# Patient Record
Sex: Female | Born: 1965
Health system: Southern US, Community
[De-identification: ages and names within clinical notes are randomized; demographics above are authoritative.]

## PROBLEM LIST (undated history)

## (undated) DIAGNOSIS — D696 Thrombocytopenia, unspecified: Secondary | ICD-10-CM

## (undated) DIAGNOSIS — K219 Gastro-esophageal reflux disease without esophagitis: Secondary | ICD-10-CM

## (undated) DIAGNOSIS — M25569 Pain in unspecified knee: Secondary | ICD-10-CM

## (undated) DIAGNOSIS — E785 Hyperlipidemia, unspecified: Secondary | ICD-10-CM

## (undated) DIAGNOSIS — I10 Essential (primary) hypertension: Secondary | ICD-10-CM

## (undated) DIAGNOSIS — F32A Depression, unspecified: Secondary | ICD-10-CM

## (undated) DIAGNOSIS — F419 Anxiety disorder, unspecified: Secondary | ICD-10-CM

## (undated) DIAGNOSIS — F329 Major depressive disorder, single episode, unspecified: Secondary | ICD-10-CM

## (undated) HISTORY — PX: TUBAL LIGATION: SHX77

## (undated) HISTORY — DX: Depression, unspecified: F32.A

## (undated) HISTORY — DX: Thrombocytopenia, unspecified: D69.6

## (undated) HISTORY — DX: Anxiety disorder, unspecified: F41.9

## (undated) HISTORY — DX: Pain in unspecified knee: M25.569

## (undated) HISTORY — DX: Gastro-esophageal reflux disease without esophagitis: K21.9

## (undated) HISTORY — DX: Major depressive disorder, single episode, unspecified: F32.9

## (undated) HISTORY — DX: Hyperlipidemia, unspecified: E78.5

## (undated) HISTORY — DX: Essential (primary) hypertension: I10

---

## 2014-03-30 ENCOUNTER — Encounter: Payer: Self-pay | Admitting: Vascular Surgery

## 2014-03-30 ENCOUNTER — Other Ambulatory Visit: Payer: Self-pay | Admitting: *Deleted

## 2014-03-30 DIAGNOSIS — M79606 Pain in leg, unspecified: Secondary | ICD-10-CM

## 2014-05-01 ENCOUNTER — Encounter: Payer: Self-pay | Admitting: Vascular Surgery

## 2014-05-02 ENCOUNTER — Encounter (HOSPITAL_COMMUNITY): Payer: Self-pay

## 2014-05-02 ENCOUNTER — Encounter: Payer: Self-pay | Admitting: Vascular Surgery

## 2014-05-29 ENCOUNTER — Encounter: Payer: Self-pay | Admitting: Vascular Surgery

## 2014-05-30 ENCOUNTER — Encounter (HOSPITAL_COMMUNITY): Payer: Self-pay

## 2014-05-30 ENCOUNTER — Encounter: Payer: Self-pay | Admitting: Vascular Surgery

## 2014-10-02 ENCOUNTER — Encounter: Payer: Self-pay | Admitting: Vascular Surgery

## 2014-10-03 ENCOUNTER — Encounter: Payer: Self-pay | Admitting: Vascular Surgery

## 2014-10-03 ENCOUNTER — Ambulatory Visit (HOSPITAL_COMMUNITY)
Admission: RE | Admit: 2014-10-03 | Discharge: 2014-10-03 | Disposition: A | Discharge status: Home / Self Care | Payer: BLUE CROSS/BLUE SHIELD | Source: Ambulatory Visit | Attending: Vascular Surgery | Admitting: Vascular Surgery

## 2014-10-03 ENCOUNTER — Ambulatory Visit (INDEPENDENT_AMBULATORY_CARE_PROVIDER_SITE_OTHER): Payer: BLUE CROSS/BLUE SHIELD | Admitting: Vascular Surgery

## 2014-10-03 VITALS — BP 109/78 | HR 103 | Resp 16 | Ht 61.0 in | Wt 217.0 lb

## 2014-10-03 DIAGNOSIS — R208 Other disturbances of skin sensation: Secondary | ICD-10-CM

## 2014-10-03 DIAGNOSIS — Z87891 Personal history of nicotine dependence: Secondary | ICD-10-CM | POA: Diagnosis not present

## 2014-10-03 DIAGNOSIS — I10 Essential (primary) hypertension: Secondary | ICD-10-CM | POA: Diagnosis not present

## 2014-10-03 DIAGNOSIS — R200 Anesthesia of skin: Secondary | ICD-10-CM

## 2014-10-03 DIAGNOSIS — E785 Hyperlipidemia, unspecified: Secondary | ICD-10-CM | POA: Diagnosis not present

## 2014-10-03 DIAGNOSIS — M79606 Pain in leg, unspecified: Secondary | ICD-10-CM

## 2014-10-03 NOTE — Progress Notes (Signed)
Patient name: Humberto LeepJudy Bergeron MRN: [id number] DOB: 11/23/1965 Sex: female   Referred by: Marvis MoellerMiles  Reason for referral:  Chief Complaint  Patient presents with  . New Evaluation    bilateral leg pain, numbness and swelling    HISTORY OF PRESENT ILLNESS: The patient is a 49 year old female with a six-month history of difficulty in her lower extremities. She reports that she has a pins and needles sensation in both feet. This will persist and then involves and 10 numbness from her knees distally into her feet. She reports this lasted several minutes and then resolved spontaneously. She is not reporting that this is associated with exercise. This just comes on spontaneously. She does report occasional swelling which is mild in her ankles and is not related to prolonged standing. She has no history of DVT. She reports that this has not been particularly progressive and has been relatively stable over the past 6 months. She does not report any calf cramping with walking. No typical arterial rest pain. No tissue loss. Does have a remote history of cigarette smoking and does have elevated platelets and is on aspirin for this reason  Past Medical History  Diagnosis Date  . Pain in joint, lower leg   . GERD (gastroesophageal reflux disease)   . Anxiety   . Depression   . Hypertension   . Hyperlipidemia   . Thrombocytopenia     Past Surgical History  Procedure Laterality Date  . Tubal ligation      History   Social History  . Marital Status: Unknown    Spouse Name: N/A    Number of Children: N/A  . Years of Education: N/A   Occupational History  . Not on file.   Social History Main Topics  . Smoking status: Former Smoker    Quit date: 10/03/1990  . Smokeless tobacco: Never Used  . Alcohol Use: No  . Drug Use: No  . Sexual Activity: Not on file   Other Topics Concern  . Not on file   Social History Narrative    Family History  Problem Relation Age of Onset  .  Hyperlipidemia Mother   . Hypertension Mother   . Hyperlipidemia Father   . Hypertension Father     Allergies as of 10/03/2014 - Review Complete 10/03/2014  Allergen Reaction Noted  . Imitrex [sumatriptan]  10/03/2014  . Sulfa antibiotics  03/30/2014    Current Outpatient Prescriptions on File Prior to Visit  Medication Sig Dispense Refill  . amitriptyline (ELAVIL) 75 MG tablet Take 75 mg by mouth at bedtime.    Marland Kitchen. atenolol (TENORMIN) 50 MG tablet Take 50 mg by mouth daily.    . busPIRone (BUSPAR) 10 MG tablet Take 10 mg by mouth 3 (three) times daily.    Marland Kitchen. lovastatin (MEVACOR) 40 MG tablet Take 40 mg by mouth at bedtime.    Marland Kitchen. omeprazole (PRILOSEC) 20 MG capsule Take 20 mg by mouth daily.    . promethazine (PHENERGAN) 25 MG tablet Take 25 mg by mouth every 6 (six) hours as needed for nausea or vomiting.    Marland Kitchen. amoxicillin (AMOXIL) 500 MG capsule Take 500 mg by mouth 3 (three) times daily.    . SUMAtriptan (IMITREX) 25 MG tablet Take 25 mg by mouth every 2 (two) hours as needed for migraine or headache. May repeat in 2 hours if headache persists or recurs.     No current facility-administered medications on file prior to visit.  REVIEW OF SYSTEMS:  Positives indicated with an "X"  CARDIOVASCULAR:   chest pain    chest pressure    palpitations   [x ] orthopnea   [x ] dyspnea on exertion    claudication    rest pain    DVT    phlebitis PULMONARY:    productive cough   [x ] asthma    wheezing NEUROLOGIC:    weakness  [x ] paresthesias   aphasia   amaurosis   dizziness HEMATOLOGIC:    bleeding problems    clotting disorders MUSCULOSKELETAL:   joint pain    joint swelling GASTROINTESTINAL:   blood in stool    hematemesis GENITOURINARY:    dysuria    hematuria PSYCHIATRIC:  [x ] history of major depression INTEGUMENTARY:   rashes   ulcers CONSTITUTIONAL:   fever    chills  PHYSICAL EXAMINATION:  General:  The patient is a well-nourished female, in no acute distress. Obesity Vital signs are BP 109/78 mmHg  Pulse 103  Resp 16  Ht  (1.549 m)  Wt 217 lb (98.431 kg)  BMI 41.02 kg/m2 Pulmonary: There is a good air exchange bilaterally without wheezing or rales. Abdomen: Soft and non-tender with normal pitch bowel sounds. Musculoskeletal: There are no major deformities.  There is no significant extremity pain. Neurologic: No focal weakness or paresthesias are detected, Skin: There are no ulcer or rashes noted. Psychiatric: The patient has normal affect. Cardiovascular: There is a regular rate and rhythm without significant murmur appreciated. Pulse status: 2+ radial 2+ femoral 2+ popliteal 2+ posterior tibial and 2+ dorsalis pedis pulses bilaterally No evidence of changes of venous hypertension. No varicosities or telangiectasias  [facility] Vascular Lab Studies:  Ordered and Independently Reviewed normal ankle arm index and normal triphasic waveforms in the anterior tibial and posterior tibial bilaterally  Impression and Plan:  A long discussion with the patient. I explained that she does not have any evidence of arterial or venous pathology that would explain her symptoms. Explained her symptoms appear to be more neuropathic than vascular. She was relieved this discussion. Would not recommend any further evaluation or follow-up. She will follow-up with her primary physician to determine if other treatment options are available for her. She will see [place] On an As-Needed basis    Jossalin Chervenak [facility] Office: [phone]

## 2014-10-04 DIAGNOSIS — Z0279 Encounter for issue of other medical certificate: Secondary | ICD-10-CM | POA: Diagnosis not present

## 2014-10-09 ENCOUNTER — Encounter: Payer: Self-pay | Admitting: Vascular Surgery

## 2015-06-20 DIAGNOSIS — J45909 Unspecified asthma, uncomplicated: Secondary | ICD-10-CM | POA: Insufficient documentation

## 2015-06-20 DIAGNOSIS — I2510 Atherosclerotic heart disease of native coronary artery without angina pectoris: Secondary | ICD-10-CM | POA: Insufficient documentation

## 2015-06-20 DIAGNOSIS — F419 Anxiety disorder, unspecified: Secondary | ICD-10-CM | POA: Insufficient documentation

## 2015-06-20 HISTORY — DX: Atherosclerotic heart disease of native coronary artery without angina pectoris: I25.10

## 2015-06-20 HISTORY — DX: Unspecified asthma, uncomplicated: J45.909

## 2015-08-17 DIAGNOSIS — Z0279 Encounter for issue of other medical certificate: Secondary | ICD-10-CM | POA: Diagnosis not present

## 2015-09-04 ENCOUNTER — Ambulatory Visit: Payer: Self-pay | Admitting: Cardiovascular Disease

## 2019-10-05 ENCOUNTER — Other Ambulatory Visit: Payer: Self-pay | Admitting: Physician Assistant

## 2019-10-21 ENCOUNTER — Other Ambulatory Visit: Payer: Self-pay | Admitting: Physician Assistant

## 2019-10-26 ENCOUNTER — Ambulatory Visit (INDEPENDENT_AMBULATORY_CARE_PROVIDER_SITE_OTHER): Payer: Commercial Managed Care - PPO | Admitting: Physician Assistant

## 2019-10-26 ENCOUNTER — Encounter: Payer: Self-pay | Admitting: Physician Assistant

## 2019-10-26 ENCOUNTER — Other Ambulatory Visit: Payer: Self-pay

## 2019-10-26 VITALS — BP 118/78 | HR 106 | Temp 98.1°F | Resp 16 | Ht 61.0 in | Wt 242.0 lb

## 2019-10-26 DIAGNOSIS — R5383 Other fatigue: Secondary | ICD-10-CM

## 2019-10-26 DIAGNOSIS — E782 Mixed hyperlipidemia: Secondary | ICD-10-CM | POA: Diagnosis not present

## 2019-10-26 DIAGNOSIS — E559 Vitamin D deficiency, unspecified: Secondary | ICD-10-CM

## 2019-10-26 DIAGNOSIS — K219 Gastro-esophageal reflux disease without esophagitis: Secondary | ICD-10-CM

## 2019-10-26 DIAGNOSIS — I119 Hypertensive heart disease without heart failure: Secondary | ICD-10-CM | POA: Insufficient documentation

## 2019-10-26 DIAGNOSIS — I10 Essential (primary) hypertension: Secondary | ICD-10-CM | POA: Diagnosis not present

## 2019-10-26 DIAGNOSIS — F419 Anxiety disorder, unspecified: Secondary | ICD-10-CM

## 2019-10-26 HISTORY — DX: Other fatigue: R53.83

## 2019-10-26 HISTORY — DX: Mixed hyperlipidemia: E78.2

## 2019-10-26 HISTORY — DX: Essential (primary) hypertension: I10

## 2019-10-26 HISTORY — DX: Gastro-esophageal reflux disease without esophagitis: K21.9

## 2019-10-26 HISTORY — DX: Vitamin D deficiency, unspecified: E55.9

## 2019-10-26 NOTE — Assessment & Plan Note (Signed)
Continue meds as directed labwork pending 

## 2019-10-26 NOTE — Assessment & Plan Note (Signed)
Continue meds Labs pending Low fat diet

## 2019-10-26 NOTE — Progress Notes (Signed)
Established Patient Office Visit  Subjective:  Patient ID: Cindy Hammond, female    DOB: Jul 26, 1966  Age: 54 y.o. MRN: [id number]  CC:  Chief Complaint  Patient presents with  . Follow-up  . Hyperlipidemia    HPI Cindy Hammond presents for follow up hyperlipidemia and chronic issues  Mixed hyperlipidemia  Pt presents with hyperlipidemia.  Compliance with treatment has been good; The patient is compliant with medications, maintains a low cholesterol diet , follows up as directed , and maintains an exercise regimen . The patient denies experiencing any hypercholesterolemia related symptoms.  Evidenced based information based on history , exam, and other sources has been used  for decision making.  Pt presents for follow up of hypertension.  The patient is tolerating the medication well without side effects. Compliance with treatment has been good; including taking medication as directed , maintains a healthy diet and regular exercise regimen , and following up as directed. Patient was evaluated using exam, physical, labs and other information to perform evidence based decision making.  She is currently on atenolol 42m qd  Pt with history of GERD - stable on omeprazole [id number]  Pt with history of anxiety - states her symptoms controlled well with clonazepam, amitriptyline, and buspar - voices no problems or concerns  Pt currently taking Vit D weekly - due for labwork    Past Medical History:  Diagnosis Date  . Anxiety   . Depression   . GERD (gastroesophageal reflux disease)   . Hyperlipidemia   . Hypertension   . Pain in joint, lower leg   . Thrombocytopenia (HCCastaic    Past Surgical History:  Procedure Laterality Date  . TUBAL LIGATION      Family History  Problem Relation Age of Onset  . Hyperlipidemia Mother   . Hypertension Mother   . Hyperlipidemia Father   . Hypertension Father     Social History   Socioeconomic History  . Marital status: Married    Spouse  name: Not on file  . Number of children: Not on file  . Years of education: Not on file  . Highest education level: Not on file  Occupational History  . 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 Smoking status: Former Smoker    Quit date: 10/03/1990    Years since quitting: 29.0  . Smokeless tobacco: Never Used  Substance and Sexual Activity  . Alcohol use: No    Alcohol/week: 0.0 standard drinks  . Drug use: No  . Sexual activity: Not on file  Other Topics Concern  . Not on file  Social History Narrative  . Not on file   Social Determinants of Health   Financial Resource Strain:   . Difficulty of Paying Living Expenses: Not on file  Food Insecurity:   . Worried About RuCharity fundraisern the Last Year: Not on file  . Ran Out of Food in the Last Year: Not on file  Transportation Needs:   . Lack of Transportation (Medical): Not on file  . Lack of Transportation (Non-Medical): Not on file  Physical Activity:   . Days of Exercise per Week: Not on file  . Minutes of Exercise per Session: Not on file  Stress:   . Feeling of Stress : Not on file  Social Connections:   . Frequency of Communication with Friends and Family: Not on file  . Frequency of Social Gatherings with Friends and Family: Not on file  . Attends Religious  Services: Not on file  . Active Member of Clubs or Organizations: Not on file  . Attends Archivist Meetings: Not on file  . Marital Status: Not on file  Intimate Partner Violence:   . Fear of Current or Ex-Partner: Not on file  . Emotionally Abused: Not on file  . Physically Abused: Not on file  . Sexually Abused: Not on file     Current Outpatient Medications:  .  atorvastatin (LIPITOR) 40 MG tablet, Take 40 mg by mouth daily., Disp: , Rfl:  .  busPIRone (BUSPAR) 30 MG tablet, Take 30 mg by mouth 2 (two) times daily., Disp: , Rfl:  .  furosemide (LASIX) 20 MG tablet, Take 20 mg by mouth daily., Disp: , Rfl:  .  amitriptyline (ELAVIL)  100 MG tablet, Take by mouth., Disp: , Rfl:  .  atenolol (TENORMIN) 50 MG tablet, Take 50 mg by mouth daily., Disp: , Rfl:  .  clonazePAM (KLONOPIN) 0.5 MG tablet, Take 0.5 mg by mouth 2 (two) times daily as needed., Disp: , Rfl:  .  ergocalciferol (VITAMIN D2) 1.25 MG (50000 UT) capsule, Take by mouth., Disp: , Rfl:  .  omeprazole (PRILOSEC) 20 MG capsule, Take 20 mg by mouth daily., Disp: , Rfl:  .  pregabalin (LYRICA) 75 MG capsule, TAKE 1 CAPSULE BY MOUTH 3 TIMES DAILY., Disp: 90 capsule, Rfl: 0 .  QUEtiapine (SEROQUEL) 300 MG tablet, Take 300 mg by mouth at bedtime., Disp: , Rfl:  .  topiramate (TOPAMAX) 100 MG tablet, TAKE 1 TABLET BY MOUTH THREE(3) TIMES DAILY, Disp: 90 tablet, Rfl: 1   Allergies  Allergen Reactions  . Sumatriptan Hives and Rash  . Sulfa Antibiotics     ROS CONSTITUTIONAL: Negative for chills, fatigue, fever, unintentional weight gain and unintentional weight loss.  E/N/T: Negative for ear pain, nasal congestion and sore throat.  CARDIOVASCULAR: Negative for chest pain, dizziness, palpitations and pedal edema.  RESPIRATORY: Negative for recent cough and dyspnea.  GASTROINTESTINAL: Negative for abdominal pain, acid reflux symptoms, constipation, diarrhea, nausea and vomiting.  MSK: Negative for arthralgias and myalgias.  INTEGUMENTARY: Negative for rash.  NEUROLOGICAL: Negative for dizziness and headaches.  PSYCHIATRIC: Negative for sleep disturbance and to question depression screen.  Negative for depression, negative for anhedonia.        Objective:    PHYSICAL EXAM:   VS: BP 118/78   Pulse (!) 106   Temp 98.1 F (36.7 C)   Resp 16   Ht 5' 1"  (1.549 m)   Wt 242 lb (109.8 kg)   SpO2 96%   BMI 45.73 kg/m   GEN: Well nourished, well developed, in no acute distress  HEENT: normal external ears and nose - normal external auditory canals and TMS - hearing grossly normal - normal nasal mucosa and septum - Lips, Teeth and Gums - normal  Oropharynx -  normal mucosa, palate, and posterior pharynx Neck: no JVD or masses - no thyromegaly Cardiac: RRR; no murmurs, rubs, or gallops,no edema - no significant varicosities Respiratory:  normal respiratory rate and pattern with no distress - normal breath sounds with no rales, rhonchi, wheezes or rubs GI: normal bowel sounds, no masses or tenderness MS: no deformity or atrophy  Skin: warm and dry, no rash  Neuro:  Alert and Oriented x 3, Strength and sensation are intact - CN II-Xii grossly intact Psych: euthymic mood, appropriate affect and demeanor  BP 118/78   Pulse (!) 106   Temp 98.1 F (36.7  C)   Resp 16   Ht 5' 1"  (1.549 m)   Wt 242 lb (109.8 kg)   SpO2 96%   BMI 45.73 kg/m  Wt Readings from Last 3 Encounters:  10/26/19 242 lb (109.8 kg)  10/03/14 217 lb (98.4 kg)     Health Maintenance Due  Topic Date Due  . HIV Screening  10/19/1981  . TETANUS/TDAP  10/19/1985  . PAP SMEAR-Modifier  10/20/1987  . MAMMOGRAM  10/19/2016  . COLONOSCOPY  10/19/2016    There are no preventive care reminders to display for this patient.  No results found for: TSH No results found for: WBC, HGB, HCT, MCV, PLT No results found for: NA, K, CHLORIDE, CO2, GLUCOSE, BUN, CREATININE, BILITOT, ALKPHOS, AST, ALT, PROT, ALBUMIN, CALCIUM, ANIONGAP, EGFR, GFR No results found for: CHOL No results found for: HDL No results found for: LDLCALC No results found for: TRIG No results found for: CHOLHDL No results found for: HGBA1C    Assessment & Plan:   Problem List Items Addressed This Visit      Cardiovascular and Mediastinum   Benign hypertension - Primary    Continue meds as directed labwork pending      Relevant Medications   furosemide (LASIX) 20 MG tablet   atorvastatin (LIPITOR) 40 MG tablet   Other Relevant Orders   CBC with Differential/Platelet   Comprehensive metabolic panel   Hemoglobin A1c     Digestive   Gastroesophageal reflux disease without esophagitis    Continue  meds as directed        Other   Anxiety    Continue meds as directed      Relevant Medications   amitriptyline (ELAVIL) 100 MG tablet   busPIRone (BUSPAR) 30 MG tablet   Other fatigue    labwork pending      Relevant Orders   TSH   Vitamin D deficiency    Continue vit D weekly labwork pending      Relevant Orders   VITAMIN D 25 Hydroxy (Vit-D Deficiency, Fractures)   Mixed hyperlipidemia    Continue meds Labs pending Low fat diet      Relevant Medications   furosemide (LASIX) 20 MG tablet   atorvastatin (LIPITOR) 40 MG tablet   Other Relevant Orders   Lipid panel      No orders of the defined types were placed in this encounter.   Follow-up: Return in about 3 months (around 01/23/2020).    SARA R Terina Mcelhinny, PA-C

## 2019-10-26 NOTE — Assessment & Plan Note (Signed)
Continue meds as directed

## 2019-10-26 NOTE — Assessment & Plan Note (Signed)
labwork pending 

## 2019-10-26 NOTE — Assessment & Plan Note (Signed)
Continue vit D weekly labwork pending

## 2019-10-27 LAB — COMPREHENSIVE METABOLIC PANEL
ALT: 69 IU/L — ABNORMAL HIGH (ref 0–32)
AST: 41 IU/L — ABNORMAL HIGH (ref 0–40)
Albumin/Globulin Ratio: 1.5 (ref 1.2–2.2)
Albumin: 4.3 g/dL (ref 3.8–4.9)
Alkaline Phosphatase: 259 IU/L — ABNORMAL HIGH (ref 39–117)
BUN/Creatinine Ratio: 12 (ref 9–23)
BUN: 10 mg/dL (ref 6–24)
Bilirubin Total: 0.3 mg/dL (ref 0.0–1.2)
CO2: 21 mmol/L (ref 20–29)
Calcium: 9.4 mg/dL (ref 8.7–10.2)
Chloride: 105 mmol/L (ref 96–106)
Creatinine, Ser: 0.84 mg/dL (ref 0.57–1.00)
GFR calc Af Amer: 92 mL/min/{1.73_m2} (ref >59)
GFR calc non Af Amer: 80 mL/min/{1.73_m2} (ref >59)
Globulin, Total: 2.8 g/dL (ref 1.5–4.5)
Glucose: 116 mg/dL — ABNORMAL HIGH (ref 65–99)
Potassium: 4.1 mmol/L (ref 3.5–5.2)
Sodium: 139 mmol/L (ref 134–144)
Total Protein: 7.1 g/dL (ref 6.0–8.5)

## 2019-10-27 LAB — CBC WITH DIFFERENTIAL/PLATELET
Basophils Absolute: 0.1 10*3/uL (ref 0.0–0.2)
Basos: 1 %
EOS (ABSOLUTE): 0.3 10*3/uL (ref 0.0–0.4)
Eos: 5 %
Hematocrit: 42.5 % (ref 34.0–46.6)
Hemoglobin: 13.8 g/dL (ref 11.1–15.9)
Immature Grans (Abs): 0 10*3/uL (ref 0.0–0.1)
Immature Granulocytes: 1 %
Lymphocytes Absolute: 1.7 10*3/uL (ref 0.7–3.1)
Lymphs: 24 %
MCH: 28.5 pg (ref 26.6–33.0)
MCHC: 32.5 g/dL (ref 31.5–35.7)
MCV: 88 fL (ref 79–97)
Monocytes Absolute: 0.6 10*3/uL (ref 0.1–0.9)
Monocytes: 9 %
Neutrophils Absolute: 4.3 10*3/uL (ref 1.4–7.0)
Neutrophils: 60 %
Platelets: 357 10*3/uL (ref 150–450)
RBC: 4.85 x10E6/uL (ref 3.77–5.28)
RDW: 15.5 % — ABNORMAL HIGH (ref 11.7–15.4)
WBC: 7 10*3/uL (ref 3.4–10.8)

## 2019-10-27 LAB — TSH: TSH: 1.54 u[IU]/mL (ref 0.450–4.500)

## 2019-10-27 LAB — LIPID PANEL
Chol/HDL Ratio: 4.8 ratio — ABNORMAL HIGH (ref 0.0–4.4)
Cholesterol, Total: 221 mg/dL — ABNORMAL HIGH (ref 100–199)
HDL: 46 mg/dL (ref >39)
LDL Chol Calc (NIH): 132 mg/dL — ABNORMAL HIGH (ref 0–99)
Triglycerides: 244 mg/dL — ABNORMAL HIGH (ref 0–149)
VLDL Cholesterol Cal: 43 mg/dL — ABNORMAL HIGH (ref 5–40)

## 2019-10-27 LAB — HEMOGLOBIN A1C
Est. average glucose Bld gHb Est-mCnc: 128 mg/dL
Hgb A1c MFr Bld: 6.1 % — ABNORMAL HIGH (ref 4.8–5.6)

## 2019-10-27 LAB — CARDIOVASCULAR RISK ASSESSMENT

## 2019-10-27 LAB — VITAMIN D 25 HYDROXY (VIT D DEFICIENCY, FRACTURES): Vit D, 25-Hydroxy: 26.9 ng/mL — ABNORMAL LOW (ref 30.0–100.0)

## 2019-11-05 ENCOUNTER — Encounter: Payer: Self-pay | Admitting: Physician Assistant

## 2019-11-07 ENCOUNTER — Other Ambulatory Visit: Payer: Self-pay | Admitting: Physician Assistant

## 2019-11-07 MED ORDER — ATENOLOL 50 MG PO TABS: 50.0000 mg | ORAL_TABLET | Freq: Every day | ORAL | 1 refills | Status: DC

## 2019-11-07 MED ORDER — QUETIAPINE FUMARATE 300 MG PO TABS: 300.0000 mg | ORAL_TABLET | Freq: Every day | ORAL | 1 refills | Status: DC

## 2019-11-07 MED ORDER — AMITRIPTYLINE HCL 100 MG PO TABS: 100.0000 mg | ORAL_TABLET | Freq: Every day | ORAL | 1 refills | Status: DC

## 2019-11-07 MED ORDER — TOPIRAMATE 100 MG PO TABS: ORAL_TABLET | ORAL | 1 refills | Status: DC

## 2019-11-18 ENCOUNTER — Other Ambulatory Visit: Payer: Self-pay | Admitting: Physician Assistant

## 2019-12-19 ENCOUNTER — Other Ambulatory Visit: Payer: Self-pay | Admitting: Physician Assistant

## 2019-12-29 ENCOUNTER — Other Ambulatory Visit: Payer: Self-pay | Admitting: Physician Assistant

## 2020-01-11 LAB — HM PAP SMEAR: HM Pap smear: NORMAL

## 2020-01-20 ENCOUNTER — Other Ambulatory Visit: Payer: Self-pay | Admitting: Physician Assistant

## 2020-01-31 ENCOUNTER — Encounter: Payer: Self-pay | Admitting: Physician Assistant

## 2020-01-31 ENCOUNTER — Other Ambulatory Visit: Payer: Self-pay

## 2020-01-31 ENCOUNTER — Ambulatory Visit (INDEPENDENT_AMBULATORY_CARE_PROVIDER_SITE_OTHER): Payer: Commercial Managed Care - PPO | Admitting: Physician Assistant

## 2020-01-31 VITALS — BP 118/78 | HR 82 | Temp 97.9°F | Ht 61.0 in | Wt 245.0 lb

## 2020-01-31 DIAGNOSIS — G43909 Migraine, unspecified, not intractable, without status migrainosus: Secondary | ICD-10-CM | POA: Insufficient documentation

## 2020-01-31 DIAGNOSIS — R7303 Prediabetes: Secondary | ICD-10-CM

## 2020-01-31 DIAGNOSIS — F419 Anxiety disorder, unspecified: Secondary | ICD-10-CM

## 2020-01-31 DIAGNOSIS — E782 Mixed hyperlipidemia: Secondary | ICD-10-CM

## 2020-01-31 DIAGNOSIS — E559 Vitamin D deficiency, unspecified: Secondary | ICD-10-CM

## 2020-01-31 DIAGNOSIS — G43809 Other migraine, not intractable, without status migrainosus: Secondary | ICD-10-CM

## 2020-01-31 DIAGNOSIS — I10 Essential (primary) hypertension: Secondary | ICD-10-CM | POA: Diagnosis not present

## 2020-01-31 HISTORY — DX: Migraine, unspecified, not intractable, without status migrainosus: G43.909

## 2020-01-31 HISTORY — DX: Prediabetes: R73.03

## 2020-01-31 MED ORDER — UBRELVY 50 MG PO TABS: 50.0000 mg | ORAL_TABLET | Freq: Once | ORAL | 0 refills | Status: AC

## 2020-01-31 MED ORDER — PREGABALIN 75 MG PO CAPS: 75.0000 mg | ORAL_CAPSULE | Freq: Three times a day (TID) | ORAL | 2 refills | Status: DC

## 2020-01-31 MED ORDER — TOPIRAMATE 100 MG PO TABS: ORAL_TABLET | ORAL | 1 refills | Status: DC

## 2020-01-31 MED ORDER — OMEPRAZOLE 40 MG PO CPDR: 40.0000 mg | DELAYED_RELEASE_CAPSULE | Freq: Every day | ORAL | 1 refills | Status: DC

## 2020-01-31 NOTE — Progress Notes (Signed)
Established Patient Office Visit  Subjective:  Patient ID: Cindy Hammond, female    DOB: 07-03-1966  Age: 54 y.o. MRN: [id number]  CC:  Chief Complaint  Patient presents with  . Hypertension    Fasting follow up  . Hyperlipidemia    HPI [name] presents for follow up hyperlipidemia and chronic issues  Mixed hyperlipidemia  Pt presents with hyperlipidemia.  Compliance with treatment has been good; The patient is compliant with medications, maintains a low cholesterol diet , follows up as directed , and maintains an exercise regimen . The patient denies experiencing any hypercholesterolemia related symptoms.  Evidenced based information based on history , exam, and other sources has been used  for decision making. She is currently taking lipitor 40mg  qd  Pt presents for follow up of hypertension.  The patient is tolerating the medication well without side effects. Compliance with treatment has been good; including taking medication as directed , maintains a healthy diet and regular exercise regimen , and following up as directed. Patient was evaluated using exam, physical, labs and other information to perform evidence based decision making.  She is currently on atenolol 50mg  qd  Pt with history of GERD - stable on omeprazole 40mg  qd  Pt with history of anxiety - states her symptoms controlled well with clonazepam, amitriptyline, and buspar - voices no problems or concerns  Pt currently taking Vit D weekly - due for labwork  Pt has history of migraine headaches - she currently is on topamax but says she is having some breakthrough symptoms - requested to refill tylenol with codeine but told pt I do not use narcotics for acute headaches but could try another medication Past Medical History:  Diagnosis Date  . Anxiety   . Depression   . GERD (gastroesophageal reflux disease)   . Hyperlipidemia   . Hypertension   . Pain in joint, lower leg   . Thrombocytopenia (HCC)     Past  Surgical History:  Procedure Laterality Date  . TUBAL LIGATION      Family History  Problem Relation Age of Onset  . Hyperlipidemia Mother   . Hypertension Mother   . Hyperlipidemia Father   . Hypertension Father     Social History   Socioeconomic History  . Marital status: Married    Spouse name: Not on file  . Number of children: Not on file  . Years of education: Not on file  . Highest education level: Not on file  Occupational History  . Occupation:  Tobacco Use  . Smoking status: Former Smoker    Quit date: 10/03/1990    Years since quitting: 29.3  . Smokeless tobacco: Never Used  Substance and Sexual Activity  . Alcohol use: No    Alcohol/week: 0.0 standard drinks  . Drug use: No  . Sexual activity: Not on file  Other Topics Concern  . Not on file  Social History Narrative  . Not on file   Social Determinants of Health   Financial Resource Strain:   . Difficulty of Paying Living Expenses:   Food Insecurity:   . Worried About in the Last Year:   . Equities trader in the Last Year:   Transportation Needs:   . 12/02/1990 (Medical):   Programme researcher, broadcasting/film/video Lack of Transportation (Non-Medical):   Physical Activity:   . Days of Exercise per Week:   . Minutes of Exercise per Session:   Stress:   .  Feeling of Stress :   Social Connections:   . Frequency of Communication with Friends and Family:   . Frequency of Social Gatherings with Friends and Family:   . Attends Religious Services:   . Active Member of Clubs or Organizations:   . Attends Archivist Meetings:   Marland Kitchen Marital Status:   Intimate Partner Violence:   . Fear of Current or Ex-Partner:   . Emotionally Abused:   Marland Kitchen Physically Abused:   . Sexually Abused:      Current Outpatient Medications:  .  amitriptyline (ELAVIL) 100 MG tablet, Take 1 tablet (100 mg total) by mouth at bedtime., Disp: 90 tablet, Rfl: 1 .  atenolol (TENORMIN) 50 MG tablet, Take 1 tablet  (50 mg total) by mouth daily., Disp: 90 tablet, Rfl: 1 .  atorvastatin (LIPITOR) 40 MG tablet, TAKE 1 TABLET BY MOUTH ONCE DAILY, Disp: 90 tablet, Rfl: 0 .  busPIRone (BUSPAR) 30 MG tablet, Take 30 mg by mouth 2 (two) times daily., Disp: , Rfl:  .  clonazePAM (KLONOPIN) 0.5 MG tablet, TAKE 1 TABLET BY MOUTH TWICE DAILY., Disp: 60 tablet, Rfl: 1 .  furosemide (LASIX) 20 MG tablet, Take 20 mg by mouth daily., Disp: , Rfl:  .  omeprazole (PRILOSEC) 40 MG capsule, Take 1 capsule (40 mg total) by mouth daily., Disp: 90 capsule, Rfl: 1 .  pregabalin (LYRICA) 75 MG capsule, Take 1 capsule (75 mg total) by mouth 3 (three) times daily., Disp: 90 capsule, Rfl: 2 .  QUEtiapine (SEROQUEL) 300 MG tablet, Take 1 tablet (300 mg total) by mouth at bedtime., Disp: 90 tablet, Rfl: 1 .  topiramate (TOPAMAX) 100 MG tablet, TAKE 1 TABLET BY MOUTH THREE(3) TIMES DAILY, Disp: 270 tablet, Rfl: 1 .  Vitamin D, Ergocalciferol, (DRISDOL) 1.25 MG (50000 UNIT) CAPS capsule, TAKE 1 CAPSULE BY MOUTH ONCE A WEEK., Disp: 12 capsule, Rfl: 1 .  Ubrogepant (UBRELVY) 50 MG TABS, Take 50 mg by mouth once for 1 dose., Disp: 10 tablet, Rfl: 0   Allergies  Allergen Reactions  . Sumatriptan Hives and Rash  . Sulfa Antibiotics     ROS CONSTITUTIONAL: Negative for chills, fatigue, fever, unintentional weight gain and unintentional weight loss.  E/N/T: Negative for ear pain, nasal congestion and sore throat.  CARDIOVASCULAR: Negative for chest pain, dizziness, palpitations and pedal edema.  RESPIRATORY: Negative for recent cough and dyspnea.  GASTROINTESTINAL: Negative for abdominal pain, acid reflux symptoms, constipation, diarrhea, nausea and vomiting.  MSK: Negative for arthralgias and myalgias.  INTEGUMENTARY: Negative for rash.  NEUROLOGICAL: more often having migraines PSYCHIATRIC: Negative for sleep disturbance and to question depression screen.  Negative for depression, negative for anhedonia.        Objective:     PHYSICAL EXAM:   VS: BP 118/78 (BP Location: Left Arm, Patient Position: Sitting)   Pulse 82   Temp 97.9 F (36.6 C) (Temporal)   Ht 5\' 1"  (1.549 m)   Wt 245 lb (111.1 kg)   SpO2 97%   BMI 46.29 kg/m   GEN: Well nourished, well developed, in no acute distress  Cardiac: RRR; no murmurs, rubs, or gallops,no edema - no significant varicosities Respiratory:  normal respiratory rate and pattern with no distress - normal breath sounds with no rales, rhonchi, wheezes or rubs GI: normal bowel sounds, no masses or tenderness MS: no deformity or atrophy  Skin: warm and dry, no rash  Neuro:  Alert and Oriented x 3, Strength and sensation are intact - CN  II-Xii grossly intact Psych: euthymic mood, appropriate affect and demeanor  BP 118/78 (BP Location: Left Arm, Patient Position: Sitting)   Pulse 82   Temp 97.9 F (36.6 C) (Temporal)   Ht 5\' 1"  (1.549 m)   Wt 245 lb (111.1 kg)   SpO2 97%   BMI 46.29 kg/m  Wt Readings from Last 3 Encounters:  01/31/20 245 lb (111.1 kg)  10/26/19 242 lb (109.8 kg)  10/03/14 217 lb (98.4 kg)     Health Maintenance Due  Topic Date Due  . COVID-19 Vaccine (1) Never done  . HIV Screening  Never done  . TETANUS/TDAP  Never done  . PAP SMEAR-Modifier  Never done  . MAMMOGRAM  Never done  . COLONOSCOPY  Never done    There are no preventive care reminders to display for this patient.  Lab Results  Component Value Date   TSH 1.540 10/26/2019   Lab Results  Component Value Date   WBC 7.0 10/26/2019   HGB 13.8 10/26/2019   HCT 42.5 10/26/2019   MCV 88 10/26/2019   PLT 357 10/26/2019   Lab Results  Component Value Date   NA 139 10/26/2019   K 4.1 10/26/2019   CO2 21 10/26/2019   GLUCOSE 116 (H) 10/26/2019   BUN 10 10/26/2019   CREATININE 0.84 10/26/2019   BILITOT 0.3 10/26/2019   ALKPHOS 259 (H) 10/26/2019   AST 41 (H) 10/26/2019   ALT 69 (H) 10/26/2019   PROT 7.1 10/26/2019   ALBUMIN 4.3 10/26/2019   CALCIUM 9.4 10/26/2019    Lab Results  Component Value Date   CHOL 221 (H) 10/26/2019   Lab Results  Component Value Date   HDL 46 10/26/2019   Lab Results  Component Value Date   LDLCALC 132 (H) 10/26/2019   Lab Results  Component Value Date   TRIG 244 (H) 10/26/2019   Lab Results  Component Value Date   CHOLHDL 4.8 (H) 10/26/2019   Lab Results  Component Value Date   HGBA1C 6.1 (H) 10/26/2019      Assessment & Plan:   Problem List Items Addressed This Visit      Cardiovascular and Mediastinum   Benign hypertension - Primary    Well controlled.  No changes to medicines.  Continue to work on eating a healthy diet and exercise.  Labs drawn today.        Relevant Orders   CBC with Differential/Platelet   Comprehensive metabolic panel   Migraine    Trial of Ubrelvy - samples given      Relevant Medications   pregabalin (LYRICA) 75 MG capsule   topiramate (TOPAMAX) 100 MG tablet   Ubrogepant (UBRELVY) 50 MG TABS     Other   Anxiety    Continue current meds as directed      Vitamin D deficiency    labwork pending      Relevant Orders   VITAMIN D 25 Hydroxy (Vit-D Deficiency, Fractures)   Mixed hyperlipidemia    Well controlled.  No changes to medicines.  Continue to work on eating a healthy diet and exercise.  Labs drawn today.        Relevant Orders   Lipid panel   Prediabetes    hgb A1 C pending      Relevant Orders   Hemoglobin A1c      Meds ordered this encounter  Medications  . pregabalin (LYRICA) 75 MG capsule    Sig: Take 1 capsule (75 mg total)  by mouth 3 (three) times daily.    Dispense:  90 capsule    Refill:  2    Order Specific Question:   Supervising Provider    AnswerBlane Ohara [id number]  . omeprazole (PRILOSEC) 40 MG capsule    Sig: Take 1 capsule (40 mg total) by mouth daily.    Dispense:  90 capsule    Refill:  1    Order Specific Question:   Supervising Provider    AnswerBlane Ohara [id number]  . topiramate (TOPAMAX) 100 MG  tablet    Sig: TAKE 1 TABLET BY MOUTH THREE(3) TIMES DAILY    Dispense:  270 tablet    Refill:  1    Order Specific Question:   Supervising Provider    AnswerCorey Harold  . Ubrogepant (UBRELVY) 50 MG TABS    Sig: Take 50 mg by mouth once for 1 dose.    Dispense:  10 tablet    Refill:  0    Order Specific Question:   Supervising Provider    Answer:   Corey Harold    Follow-up: Return in about 3 months (around 05/02/2020) for chronic fasting follow up.    SARA R Mabell Esguerra, PA-C

## 2020-01-31 NOTE — Assessment & Plan Note (Signed)
Trial of Ubrelvy - samples given

## 2020-01-31 NOTE — Assessment & Plan Note (Signed)
Well controlled.  ?No changes to medicines.  ?Continue to work on eating a healthy diet and exercise.  ?Labs drawn today.  ?

## 2020-01-31 NOTE — Assessment & Plan Note (Signed)
hgb A1 C pending

## 2020-01-31 NOTE — Assessment & Plan Note (Signed)
labwork pending 

## 2020-01-31 NOTE — Assessment & Plan Note (Signed)
Continue current meds as directed 

## 2020-02-01 LAB — COMPREHENSIVE METABOLIC PANEL
ALT: 30 IU/L (ref 0–32)
AST: 21 IU/L (ref 0–40)
Albumin/Globulin Ratio: 1.3 (ref 1.2–2.2)
Albumin: 4 g/dL (ref 3.8–4.9)
Alkaline Phosphatase: 210 IU/L — ABNORMAL HIGH (ref 48–121)
BUN/Creatinine Ratio: 12 (ref 9–23)
BUN: 10 mg/dL (ref 6–24)
Bilirubin Total: 0.3 mg/dL (ref 0.0–1.2)
CO2: 20 mmol/L (ref 20–29)
Calcium: 9 mg/dL (ref 8.7–10.2)
Chloride: 104 mmol/L (ref 96–106)
Creatinine, Ser: 0.81 mg/dL (ref 0.57–1.00)
GFR calc Af Amer: 96 mL/min/{1.73_m2} (ref >59)
GFR calc non Af Amer: 83 mL/min/{1.73_m2} (ref >59)
Globulin, Total: 3.1 g/dL (ref 1.5–4.5)
Glucose: 109 mg/dL — ABNORMAL HIGH (ref 65–99)
Potassium: 4.1 mmol/L (ref 3.5–5.2)
Sodium: 139 mmol/L (ref 134–144)
Total Protein: 7.1 g/dL (ref 6.0–8.5)

## 2020-02-01 LAB — CBC WITH DIFFERENTIAL/PLATELET
Basophils Absolute: 0.1 10*3/uL (ref 0.0–0.2)
Basos: 1 %
EOS (ABSOLUTE): 0.3 10*3/uL (ref 0.0–0.4)
Eos: 4 %
Hematocrit: 42.5 % (ref 34.0–46.6)
Hemoglobin: 14.1 g/dL (ref 11.1–15.9)
Immature Grans (Abs): 0 10*3/uL (ref 0.0–0.1)
Immature Granulocytes: 0 %
Lymphocytes Absolute: 1.7 10*3/uL (ref 0.7–3.1)
Lymphs: 22 %
MCH: 29.3 pg (ref 26.6–33.0)
MCHC: 33.2 g/dL (ref 31.5–35.7)
MCV: 88 fL (ref 79–97)
Monocytes Absolute: 0.6 10*3/uL (ref 0.1–0.9)
Monocytes: 8 %
Neutrophils Absolute: 4.8 10*3/uL (ref 1.4–7.0)
Neutrophils: 65 %
Platelets: 340 10*3/uL (ref 150–450)
RBC: 4.81 x10E6/uL (ref 3.77–5.28)
RDW: 14.3 % (ref 11.7–15.4)
WBC: 7.5 10*3/uL (ref 3.4–10.8)

## 2020-02-01 LAB — LIPID PANEL
Chol/HDL Ratio: 5.1 ratio — ABNORMAL HIGH (ref 0.0–4.4)
Cholesterol, Total: 184 mg/dL (ref 100–199)
HDL: 36 mg/dL — ABNORMAL LOW (ref >39)
LDL Chol Calc (NIH): 102 mg/dL — ABNORMAL HIGH (ref 0–99)
Triglycerides: 272 mg/dL — ABNORMAL HIGH (ref 0–149)
VLDL Cholesterol Cal: 46 mg/dL — ABNORMAL HIGH (ref 5–40)

## 2020-02-01 LAB — CARDIOVASCULAR RISK ASSESSMENT

## 2020-02-01 LAB — VITAMIN D 25 HYDROXY (VIT D DEFICIENCY, FRACTURES): Vit D, 25-Hydroxy: 29.5 ng/mL — ABNORMAL LOW (ref 30.0–100.0)

## 2020-02-01 LAB — HEMOGLOBIN A1C
Est. average glucose Bld gHb Est-mCnc: 131 mg/dL
Hgb A1c MFr Bld: 6.2 % — ABNORMAL HIGH (ref 4.8–5.6)

## 2020-02-23 ENCOUNTER — Other Ambulatory Visit: Payer: Self-pay | Admitting: Family Medicine

## 2020-02-23 ENCOUNTER — Other Ambulatory Visit: Payer: Self-pay | Admitting: Physician Assistant

## 2020-03-02 ENCOUNTER — Encounter: Payer: Self-pay | Admitting: Family Medicine

## 2020-03-06 ENCOUNTER — Other Ambulatory Visit: Payer: Self-pay | Admitting: Physician Assistant

## 2020-03-06 MED ORDER — UBRELVY 50 MG PO TABS: ORAL_TABLET | ORAL | 1 refills | Status: DC

## 2020-03-07 ENCOUNTER — Telehealth: Payer: Self-pay

## 2020-03-07 NOTE — Telephone Encounter (Signed)
Talked to [place] at [facility], she gave her the information for [employer] and she will call and code insurance with their help

## 2020-03-26 ENCOUNTER — Other Ambulatory Visit: Payer: Self-pay | Admitting: Physician Assistant

## 2020-03-26 ENCOUNTER — Other Ambulatory Visit: Payer: Self-pay | Admitting: Family Medicine

## 2020-04-25 ENCOUNTER — Other Ambulatory Visit: Payer: Self-pay | Admitting: Physician Assistant

## 2020-05-03 ENCOUNTER — Other Ambulatory Visit: Payer: Self-pay

## 2020-05-03 ENCOUNTER — Encounter: Payer: Self-pay | Admitting: Physician Assistant

## 2020-05-03 ENCOUNTER — Ambulatory Visit (INDEPENDENT_AMBULATORY_CARE_PROVIDER_SITE_OTHER): Payer: Commercial Managed Care - PPO | Admitting: Physician Assistant

## 2020-05-03 VITALS — BP 128/74 | HR 98 | Temp 97.5°F | Resp 17 | Ht 64.0 in | Wt 245.8 lb

## 2020-05-03 DIAGNOSIS — E782 Mixed hyperlipidemia: Secondary | ICD-10-CM | POA: Diagnosis not present

## 2020-05-03 DIAGNOSIS — F419 Anxiety disorder, unspecified: Secondary | ICD-10-CM

## 2020-05-03 DIAGNOSIS — R7303 Prediabetes: Secondary | ICD-10-CM

## 2020-05-03 DIAGNOSIS — Z23 Encounter for immunization: Secondary | ICD-10-CM

## 2020-05-03 DIAGNOSIS — E559 Vitamin D deficiency, unspecified: Secondary | ICD-10-CM

## 2020-05-03 DIAGNOSIS — Z1211 Encounter for screening for malignant neoplasm of colon: Secondary | ICD-10-CM

## 2020-05-03 DIAGNOSIS — R5383 Other fatigue: Secondary | ICD-10-CM

## 2020-05-03 HISTORY — DX: Encounter for immunization: Z23

## 2020-05-03 MED ORDER — UBRELVY 100 MG PO TABS: 100.0000 mg | ORAL_TABLET | Freq: Every day | ORAL | 2 refills | Status: DC | PRN

## 2020-05-03 MED ORDER — AMITRIPTYLINE HCL 150 MG PO TABS: 150.0000 mg | ORAL_TABLET | Freq: Every day | ORAL | 2 refills | Status: DC

## 2020-05-03 NOTE — Assessment & Plan Note (Signed)
Well controlled.  ?No changes to medicines.  ?Continue to work on eating a healthy diet and exercise.  ?Labs drawn today.  ?

## 2020-05-03 NOTE — Assessment & Plan Note (Signed)
labwork pending ?Continue meds ?

## 2020-05-03 NOTE — Assessment & Plan Note (Signed)
hgb a1c pending ?

## 2020-05-03 NOTE — Progress Notes (Signed)
Established Patient Office Visit  Subjective:  Patient ID: Cindy Hammond, female    DOB: 12-07-1965  Age: 54 y.o. MRN: [id number]  CC:  Chief Complaint  Patient presents with  . Hypertension  . Hyperlipidemia    HPI Cindy Hammond presents for follow up hyperlipidemia and chronic issues  Mixed hyperlipidemia  Pt presents with hyperlipidemia.  Compliance with treatment has been good; The patient is compliant with medications, maintains a low cholesterol diet , follows up as directed , and maintains an exercise regimen . The patient denies experiencing any hypercholesterolemia related symptoms.  Evidenced based information based on history , exam, and other sources has been used  for decision making. She is currently taking lipitor 40mg  qd  Pt presents for follow up of hypertension.  The patient is tolerating the medication well without side effects. Compliance with treatment has been good; including taking medication as directed , maintains a healthy diet and regular exercise regimen , and following up as directed. Patient was evaluated using exam, physical, labs and other information to perform evidence based decision making.  She is currently on atenolol 50mg  qd  Pt with history of GERD - stable on omeprazole 40mg  qd  Pt with history of anxiety - she states she has had some mild depression and trouble sleeping - would like to try increased dose of elavil - also currently on buspar and klonopin  Pt currently taking Vit D weekly - due for labwork  Pt has history of migraine headaches - she currently is on topamax but says she is having some breakthrough symptoms - states has helped but would like to try increased dose Past Medical History:  Diagnosis Date  . Anxiety   . Depression   . GERD (gastroesophageal reflux disease)   . Hyperlipidemia   . Hypertension   . Pain in joint, lower leg   . Thrombocytopenia (HCC)     Past Surgical History:  Procedure Laterality Date  .  TUBAL LIGATION      Family History  Problem Relation Age of Onset  . Hyperlipidemia Mother   . Hypertension Mother   . Hyperlipidemia Father   . Hypertension Father     Social History   Socioeconomic History  . Marital status: Married    Spouse name: Not on file  . Number of children: 3  . Years of education: Not on file  . Highest education level: Not on file  Occupational History  . Occupation: Unemployed  Tobacco Use  . Smoking status: Former Smoker    Quit date: 10/03/1990    Years since quitting: 29.6  . Smokeless tobacco: Never Used  Vaping Use  . Vaping Use: Never used  Substance and Sexual Activity  . Alcohol use: No    Alcohol/week: 0.0 standard drinks  . Drug use: No  . Sexual activity: Yes    Partners: Male  Other Topics Concern  . Not on file  Social History Narrative  . Not on file   Social Determinants of Health   Financial Resource Strain:   . Difficulty of Paying Living Expenses: Not on file  Food Insecurity:   . Worried About in the Last Year: Not on file  . Ran Out of Food in the Last Year: Not on file  Transportation Needs:   . Lack of Transportation (Medical): Not on file  . Lack of Transportation (Non-Medical): Not on file  Physical Activity:   . Days of Exercise per Week: Not  on file  . Minutes of Exercise per Session: Not on file  Stress:   . Feeling of Stress : Not on file  Social Connections:   . Frequency of Communication with Friends and Family: Not on file  . Frequency of Social Gatherings with Friends and Family: Not on file  . Attends Religious Services: Not on file  . Active Member of Clubs or Organizations: Not on file  . Attends BankerClub or Organization Meetings: Not on file  . Marital Status: Not on file  Intimate Partner Violence:   . Fear of Current or Ex-Partner: Not on file  . Emotionally Abused: Not on file  . Physically Abused: Not on file  . Sexually Abused: Not on file     Current Outpatient  Medications:  .  amitriptyline (ELAVIL) 100 MG tablet, Take 1 tablet (100 mg total) by mouth at bedtime., Disp: 90 tablet, Rfl: 1 .  atenolol (TENORMIN) 50 MG tablet, Take 1 tablet (50 mg total) by mouth daily., Disp: 90 tablet, Rfl: 1 .  atorvastatin (LIPITOR) 40 MG tablet, TAKE 1 TABLET BY MOUTH ONCE DAILY, Disp: 90 tablet, Rfl: 0 .  busPIRone (BUSPAR) 30 MG tablet, Take 30 mg by mouth 2 (two) times daily., Disp: , Rfl:  .  clonazePAM (KLONOPIN) 0.5 MG tablet, TAKE 1 TABLET BY MOUTH TWICE DAILY., Disp: 60 tablet, Rfl: 0 .  furosemide (LASIX) 20 MG tablet, TAKE 1 TABLET BY MOUTH ONCE DAILY., Disp: 90 tablet, Rfl: 0 .  omeprazole (PRILOSEC) 40 MG capsule, Take 1 capsule (40 mg total) by mouth daily., Disp: 90 capsule, Rfl: 1 .  pregabalin (LYRICA) 75 MG capsule, Take 1 capsule (75 mg total) by mouth 3 (three) times daily., Disp: 90 capsule, Rfl: 2 .  QUEtiapine (SEROQUEL) 300 MG tablet, TAKE 1 TABLET BY MOUTH DAILY AT BEDTIME., Disp: 90 tablet, Rfl: 1 .  topiramate (TOPAMAX) 100 MG tablet, TAKE 1 TABLET BY MOUTH THREE(3) TIMES DAILY, Disp: 270 tablet, Rfl: 1 .  Ubrogepant (UBRELVY) 50 MG TABS, One pill at onset of migraine headache, Disp: 10 tablet, Rfl: 1 .  Vitamin D, Ergocalciferol, (DRISDOL) 1.25 MG (50000 UNIT) CAPS capsule, TAKE 1 CAPSULE BY MOUTH ONCE A WEEK., Disp: 12 capsule, Rfl: 1   Allergies  Allergen Reactions  . Sumatriptan Hives and Rash  . Sulfa Antibiotics     ROS CONSTITUTIONAL: Negative for chills, fatigue, fever, unintentional weight gain and unintentional weight loss.  E/N/T: Negative for ear pain, nasal congestion and sore throat.  CARDIOVASCULAR: Negative for chest pain, dizziness, palpitations and pedal edema.  RESPIRATORY: Negative for recent cough and dyspnea.  GASTROINTESTINAL: Negative for abdominal pain, acid reflux symptoms, constipation, diarrhea, nausea and vomiting.  MSK: Negative for arthralgias and myalgias.  INTEGUMENTARY: Negative for rash.   NEUROLOGICAL: more often having migraines PSYCHIATRIC: see HPI       Objective:    PHYSICAL EXAM:   VS: BP 128/74   Pulse 98   Temp (!) 97.5 F (36.4 C)   Resp 17   Ht 5\' 4"  (1.626 m)   Wt 245 lb 12.8 oz (111.5 kg)   SpO2 100%   BMI 42.19 kg/m   GEN: Well nourished, well developed, in no acute distress  Cardiac: RRR; no murmurs, rubs, or gallops,no edema - no significant varicosities Respiratory:  normal respiratory rate and pattern with no distress - normal breath sounds with no rales, rhonchi, wheezes or rubs GI: normal bowel sounds, no masses or tenderness MS: no deformity or atrophy  Skin: warm and dry, no rash  Neuro:  Alert and Oriented x 3, Strength and sensation are intact - CN II-Xii grossly intact Psych: euthymic mood, appropriate affect and demeanor  BP 128/74   Pulse 98   Temp (!) 97.5 F (36.4 C)   Resp 17   Ht 5\' 4"  (1.626 m)   Wt 245 lb 12.8 oz (111.5 kg)   SpO2 100%   BMI 42.19 kg/m  Wt Readings from Last 3 Encounters:  05/03/20 245 lb 12.8 oz (111.5 kg)  01/31/20 245 lb (111.1 kg)  10/26/19 242 lb (109.8 kg)     Health Maintenance Due  Topic Date Due  . Hepatitis C Screening  Never done  . COVID-19 Vaccine (1) Never done  . HIV Screening  Never done  . TETANUS/TDAP  Never done  . PAP SMEAR-Modifier  Never done  . COLONOSCOPY  Never done  . INFLUENZA VACCINE  04/01/2020    There are no preventive care reminders to display for this patient.  Lab Results  Component Value Date   TSH 1.540 10/26/2019   Lab Results  Component Value Date   WBC 7.5 01/31/2020   HGB 14.1 01/31/2020   HCT 42.5 01/31/2020   MCV 88 01/31/2020   PLT 340 01/31/2020   Lab Results  Component Value Date   NA 139 01/31/2020   K 4.1 01/31/2020   CO2 20 01/31/2020   GLUCOSE 109 (H) 01/31/2020   BUN 10 01/31/2020   CREATININE 0.81 01/31/2020   BILITOT 0.3 01/31/2020   ALKPHOS 210 (H) 01/31/2020   AST 21 01/31/2020   ALT 30 01/31/2020   PROT 7.1  01/31/2020   ALBUMIN 4.0 01/31/2020   CALCIUM 9.0 01/31/2020   Lab Results  Component Value Date   CHOL 184 01/31/2020   Lab Results  Component Value Date   HDL 36 (L) 01/31/2020   Lab Results  Component Value Date   LDLCALC 102 (H) 01/31/2020   Lab Results  Component Value Date   TRIG 272 (H) 01/31/2020   Lab Results  Component Value Date   CHOLHDL 5.1 (H) 01/31/2020   Lab Results  Component Value Date   HGBA1C 6.2 (H) 01/31/2020      Assessment & Plan:   Problem List Items Addressed This Visit      Other   Anxiety    Increase elavil to 150mg  qd      Other fatigue    labwork pending      Relevant Orders   CBC with Differential/Platelet   Comprehensive metabolic panel   TSH   Vitamin D deficiency - Primary    labwork pending Continue meds      Relevant Orders   VITAMIN D 25 Hydroxy (Vit-D Deficiency, Fractures)   Mixed hyperlipidemia    Well controlled.  No changes to medicines.  Continue to work on eating a healthy diet and exercise.  Labs drawn today.        Relevant Orders   CBC with Differential/Platelet   Comprehensive metabolic panel   TSH   Lipid panel   Prediabetes    hgb a1c pending      Relevant Orders   Hemoglobin A1c   Need for prophylactic vaccination and inoculation against influenza    flucelvax given      Relevant Orders   Flu Vaccine MDCK QUAD PF      No orders of the defined types were placed in this encounter.   Follow-up: Return in about 3  months (around 08/02/2020) for chronic fasting follow up.    SARA R Tanzie Rothschild, PA-C

## 2020-05-03 NOTE — Assessment & Plan Note (Signed)
labwork pending 

## 2020-05-03 NOTE — Assessment & Plan Note (Signed)
flucelvax given 

## 2020-05-03 NOTE — Assessment & Plan Note (Signed)
Increase elavil to 150mg  qd

## 2020-05-04 ENCOUNTER — Other Ambulatory Visit: Payer: Self-pay

## 2020-05-04 ENCOUNTER — Other Ambulatory Visit: Payer: Self-pay | Admitting: Physician Assistant

## 2020-05-04 LAB — COMPREHENSIVE METABOLIC PANEL
ALT: 35 IU/L — ABNORMAL HIGH (ref 0–32)
AST: 23 IU/L (ref 0–40)
Albumin/Globulin Ratio: 1.3 (ref 1.2–2.2)
Albumin: 4.3 g/dL (ref 3.8–4.9)
Alkaline Phosphatase: 198 IU/L — ABNORMAL HIGH (ref 48–121)
BUN/Creatinine Ratio: 13 (ref 9–23)
BUN: 12 mg/dL (ref 6–24)
Bilirubin Total: 0.3 mg/dL (ref 0.0–1.2)
CO2: 22 mmol/L (ref 20–29)
Calcium: 9.1 mg/dL (ref 8.7–10.2)
Chloride: 103 mmol/L (ref 96–106)
Creatinine, Ser: 0.9 mg/dL (ref 0.57–1.00)
GFR calc Af Amer: 84 mL/min/{1.73_m2} (ref >59)
GFR calc non Af Amer: 73 mL/min/{1.73_m2} (ref >59)
Globulin, Total: 3.3 g/dL (ref 1.5–4.5)
Glucose: 113 mg/dL — ABNORMAL HIGH (ref 65–99)
Potassium: 4.2 mmol/L (ref 3.5–5.2)
Sodium: 140 mmol/L (ref 134–144)
Total Protein: 7.6 g/dL (ref 6.0–8.5)

## 2020-05-04 LAB — LIPID PANEL
Chol/HDL Ratio: 5.8 ratio — ABNORMAL HIGH (ref 0.0–4.4)
Cholesterol, Total: 228 mg/dL — ABNORMAL HIGH (ref 100–199)
HDL: 39 mg/dL — ABNORMAL LOW (ref >39)
LDL Chol Calc (NIH): 129 mg/dL — ABNORMAL HIGH (ref 0–99)
Triglycerides: 335 mg/dL — ABNORMAL HIGH (ref 0–149)
VLDL Cholesterol Cal: 60 mg/dL — ABNORMAL HIGH (ref 5–40)

## 2020-05-04 LAB — CARDIOVASCULAR RISK ASSESSMENT

## 2020-05-04 LAB — CBC WITH DIFFERENTIAL/PLATELET
Basophils Absolute: 0.1 10*3/uL (ref 0.0–0.2)
Basos: 1 %
EOS (ABSOLUTE): 0.3 10*3/uL (ref 0.0–0.4)
Eos: 4 %
Hematocrit: 46.6 % (ref 34.0–46.6)
Hemoglobin: 15.4 g/dL (ref 11.1–15.9)
Immature Grans (Abs): 0 10*3/uL (ref 0.0–0.1)
Immature Granulocytes: 1 %
Lymphocytes Absolute: 1.8 10*3/uL (ref 0.7–3.1)
Lymphs: 24 %
MCH: 30 pg (ref 26.6–33.0)
MCHC: 33 g/dL (ref 31.5–35.7)
MCV: 91 fL (ref 79–97)
Monocytes Absolute: 0.6 10*3/uL (ref 0.1–0.9)
Monocytes: 8 %
Neutrophils Absolute: 4.8 10*3/uL (ref 1.4–7.0)
Neutrophils: 62 %
Platelets: 345 10*3/uL (ref 150–450)
RBC: 5.14 x10E6/uL (ref 3.77–5.28)
RDW: 14.2 % (ref 11.7–15.4)
WBC: 7.5 10*3/uL (ref 3.4–10.8)

## 2020-05-04 LAB — TSH: TSH: 2.48 u[IU]/mL (ref 0.450–4.500)

## 2020-05-04 LAB — HEMOGLOBIN A1C
Est. average glucose Bld gHb Est-mCnc: 137 mg/dL
Hgb A1c MFr Bld: 6.4 % — ABNORMAL HIGH (ref 4.8–5.6)

## 2020-05-04 LAB — VITAMIN D 25 HYDROXY (VIT D DEFICIENCY, FRACTURES): Vit D, 25-Hydroxy: 24.8 ng/mL — ABNORMAL LOW (ref 30.0–100.0)

## 2020-05-04 MED ORDER — VITAMIN D (ERGOCALCIFEROL) 1.25 MG (50000 UNIT) PO CAPS: ORAL_CAPSULE | ORAL | 2 refills | Status: DC

## 2020-05-09 ENCOUNTER — Ambulatory Visit (INDEPENDENT_AMBULATORY_CARE_PROVIDER_SITE_OTHER): Payer: Commercial Managed Care - PPO

## 2020-05-09 DIAGNOSIS — Z23 Encounter for immunization: Secondary | ICD-10-CM

## 2020-05-21 ENCOUNTER — Other Ambulatory Visit: Payer: Self-pay | Admitting: Physician Assistant

## 2020-05-22 ENCOUNTER — Telehealth (INDEPENDENT_AMBULATORY_CARE_PROVIDER_SITE_OTHER): Payer: Commercial Managed Care - PPO | Admitting: Physician Assistant

## 2020-05-22 ENCOUNTER — Encounter: Payer: Self-pay | Admitting: Physician Assistant

## 2020-05-22 VITALS — BP 96/80 | HR 98 | Ht 64.0 in | Wt 245.0 lb

## 2020-05-22 DIAGNOSIS — J060 Acute laryngopharyngitis: Secondary | ICD-10-CM

## 2020-05-22 HISTORY — DX: Acute laryngopharyngitis: J06.0

## 2020-05-22 MED ORDER — AZITHROMYCIN 250 MG PO TABS: ORAL_TABLET | ORAL | 0 refills | Status: DC

## 2020-05-22 NOTE — Progress Notes (Signed)
Virtual Visit via Telephone Note   This visit type was conducted due to national recommendations for restrictions regarding the COVID-19 Pandemic (e.g. social distancing) in an effort to limit this patient's exposure and mitigate transmission in our community.  Due to her co-morbid illnesses, this patient is at least at moderate risk for complications without adequate follow up.  This format is felt to be most appropriate for this patient at this time.  The patient did not have access to video technology/had technical difficulties with video requiring transitioning to audio format only (telephone).  All issues noted in this document were discussed and addressed.  No physical exam could be performed with this format.  Patient verbally consented to a telehealth visit.   Date:  05/22/2020   ID:  Humberto Leep, DOB [birth year], MRN [id number]  Patient Location: Home Provider Location: Office  PCP:  Marianne Sofia, PA-C   Chief Complaint:  URI  History of Present Illness:    Nikhita Mentzel is a 54 y.o. female with URI -  Complains of sinus pressure and pnd - denies fever, cough, malaise - her husband and mother that live in same house have same symptoms and both of them recently tested negative for COVID  The patient does not have symptoms concerning for COVID-19 infection (fever, chills, cough, or new shortness of breath).    Past Medical History:  Diagnosis Date  . Anxiety   . Depression   . GERD (gastroesophageal reflux disease)   . Hyperlipidemia   . Hypertension   . Pain in joint, lower leg   . Thrombocytopenia (HCC)    Past Surgical History:  Procedure Laterality Date  . TUBAL LIGATION       Current Meds  Medication Sig  . amitriptyline (ELAVIL) 150 MG tablet Take 1 tablet (150 mg total) by mouth at bedtime.  Marland Kitchen atenolol (TENORMIN) 50 MG tablet TAKE 1 TABLET BY MOUTH ONCE DAILY  . atorvastatin (LIPITOR) 40 MG tablet TAKE 1 TABLET BY MOUTH ONCE DAILY  . busPIRone (BUSPAR) 30 MG  tablet Take 30 mg by mouth 2 (two) times daily.  . clonazePAM (KLONOPIN) 0.5 MG tablet TAKE 1 TABLET BY MOUTH TWICE DAILY.  . furosemide (LASIX) 20 MG tablet TAKE 1 TABLET BY MOUTH ONCE DAILY.  Marland Kitchen omeprazole (PRILOSEC) 40 MG capsule Take 1 capsule (40 mg total) by mouth daily.  . pregabalin (LYRICA) 75 MG capsule Take 1 capsule (75 mg total) by mouth 3 (three) times daily.  . QUEtiapine (SEROQUEL) 300 MG tablet TAKE 1 TABLET BY MOUTH DAILY AT BEDTIME.  Marland Kitchen topiramate (TOPAMAX) 100 MG tablet TAKE 1 TABLET BY MOUTH THREE(3) TIMES DAILY  . Ubrogepant (UBRELVY) 100 MG TABS Take 100 mg by mouth daily as needed.  . Vitamin D, Ergocalciferol, (DRISDOL) 1.25 MG (50000 UNIT) CAPS capsule 1 po twice weekly     Allergies:   Sumatriptan and Sulfa antibiotics   Social History   Tobacco Use  . Smoking status: Former Smoker    Quit date: 10/03/1990    Years since quitting: 29.6  . Smokeless tobacco: Never Used  Vaping Use  . Vaping Use: Never used  Substance Use Topics  . Alcohol use: No    Alcohol/week: 0.0 standard drinks  . Drug use: No     Family Hx: The patient's family history includes Hyperlipidemia in her father and mother; Hypertension in her father and mother.  ROS:   Please see the history of present illness.    All other systems reviewed  and are negative.  Labs/Other Tests and Data Reviewed:    Recent Labs: 05/03/2020: ALT 35; BUN 12; Creatinine, Ser 0.90; Hemoglobin 15.4; Platelets 345; Potassium 4.2; Sodium 140; TSH 2.480   Recent Lipid Panel Lab Results  Component Value Date/Time   CHOL 228 (H) 05/03/2020 08:45 AM   TRIG 335 (H) 05/03/2020 08:45 AM   HDL 39 (L) 05/03/2020 08:45 AM   CHOLHDL 5.8 (H) 05/03/2020 08:45 AM   LDLCALC 129 (H) 05/03/2020 08:45 AM    Wt Readings from Last 3 Encounters:  05/22/20 245 lb (111.1 kg)  05/03/20 245 lb 12.8 oz (111.5 kg)  01/31/20 245 lb (111.1 kg)     Objective:    Vital Signs:  BP 96/80   Pulse 98   Ht 5\' 4"  (1.626 m)   Wt  245 lb (111.1 kg)   BMI 42.05 kg/m    VITAL SIGNS:  reviewed  ASSESSMENT & PLAN:    1. URI/sinusitis - rx for zpack and otc decongestants as needed - follow up if symptoms change or worsen  COVID-19 Education: The signs and symptoms of COVID-19 were discussed with the patient and how to seek care for testing (follow up with PCP or arrange E-visit). The importance of social distancing was discussed today.  Time:   Today, I have spent 10 minutes with the patient with telehealth technology discussing the above problems.     Medication Adjustments/Labs and Tests Ordered: Current medicines are reviewed at length with the patient today.  Concerns regarding medicines are outlined above.   Tests Ordered: No orders of the defined types were placed in this encounter.   Medication Changes: Meds ordered this encounter  Medications  . azithromycin (ZITHROMAX) 250 MG tablet    Sig: 2 po day one then one po days 2-5    Dispense:  6 each    Refill:  0    Order Specific Question:   Supervising Provider    Answer    Follow Up:  In Person prn  Signed, Corey Harold, PA-C  05/22/2020 1:57 PM    [facility] 05/24/2020

## 2020-05-22 NOTE — Assessment & Plan Note (Signed)
rx for zpack otc decongestants Follow up if symptoms change or worsen

## 2020-05-30 ENCOUNTER — Other Ambulatory Visit: Payer: Self-pay | Admitting: Physician Assistant

## 2020-06-18 ENCOUNTER — Other Ambulatory Visit: Payer: Self-pay | Admitting: Physician Assistant

## 2020-06-20 ENCOUNTER — Other Ambulatory Visit: Payer: Self-pay

## 2020-06-20 ENCOUNTER — Ambulatory Visit (INDEPENDENT_AMBULATORY_CARE_PROVIDER_SITE_OTHER): Payer: Commercial Managed Care - PPO | Admitting: Family Medicine

## 2020-06-20 VITALS — BP 110/76 | HR 72 | Temp 97.4°F | Resp 18 | Ht 62.0 in | Wt 249.0 lb

## 2020-06-20 DIAGNOSIS — M25571 Pain in right ankle and joints of right foot: Secondary | ICD-10-CM | POA: Diagnosis not present

## 2020-06-20 MED ORDER — NAPROXEN 500 MG PO TABS: 500.0000 mg | ORAL_TABLET | Freq: Two times a day (BID) | ORAL | 0 refills | Status: DC

## 2020-06-20 NOTE — Progress Notes (Signed)
Acute Office Visit  Subjective:    Patient ID: Cindy Hammond, female    DOB: 1966/07/06, 54 y.o.   MRN: [id number]  Chief Complaint  Patient presents with  . Ankle Pain    right    HPI: Patient is in today for right ankle pain x 3 days. Patient mentioned that she fell on Monday at 5 pm after this her right ankle has been hurting and swelling.  Patient has tried some over-the-counter anti-inflammatories as well as ice.  Weightbearing is very painful  She also has problems to sleep due to her fibromyalgia. She said that she only gets to sleep 3 to 4 hours in the night. Kennon Rounds had increased her amitriptyline but has not been helping.  Patient is on numerous medicines for sleep.  This includes amitriptyline 150 mg once at night, Seroquel 300 mg once at night, Topamax 100 mg once at night   Past Medical History:  Diagnosis Date  . Anxiety   . Depression   . GERD (gastroesophageal reflux disease)   . Hyperlipidemia   . Hypertension   . Pain in joint, lower leg   . Thrombocytopenia (HCC)     Past Surgical History:  Procedure Laterality Date  . TUBAL LIGATION      Family History  Problem Relation Age of Onset  . Hyperlipidemia Mother   . Hypertension Mother   . Hyperlipidemia Father   . Hypertension Father     Social History   Socioeconomic History  . Marital status: Married    Spouse name: Not on file  . Number of children: 3  . Years of education: Not on file  . Highest education level: Not on file  Occupational History  . Occupation: Unemployed  Tobacco Use  . Smoking status: Former Smoker    Quit date: 10/03/1990    Years since quitting: 29.7  . Smokeless tobacco: Never Used  Vaping Use  . Vaping Use: Never used  Substance and Sexual Activity  . Alcohol use: No    Alcohol/week: 0.0 standard drinks  . Drug use: No  . Sexual activity: Yes    Partners: Male  Other Topics Concern  . Not on file  Social History Narrative  . Not on file   Social Determinants of  Health   Financial Resource Strain:   . Difficulty of Paying Living Expenses: Not on file  Food Insecurity:   . Worried About Programme researcher, broadcasting/film/video in the Last Year: Not on file  . Ran Out of Food in the Last Year: Not on file  Transportation Needs:   . Lack of Transportation (Medical): Not on file  . Lack of Transportation (Non-Medical): Not on file  Physical Activity:   . Days of Exercise per Week: Not on file  . Minutes of Exercise per Session: Not on file  Stress:   . Feeling of Stress : Not on file  Social Connections:   . Frequency of Communication with Friends and Family: Not on file  . Frequency of Social Gatherings with Friends and Family: Not on file  . Attends Religious Services: Not on file  . Active Member of Clubs or Organizations: Not on file  . Attends Banker Meetings: Not on file  . Marital Status: Not on file  Intimate Partner Violence:   . Fear of Current or Ex-Partner: Not on file  . Emotionally Abused: Not on file  . Physically Abused: Not on file  . Sexually Abused: Not on file  Outpatient Medications Prior to Visit  Medication Sig Dispense Refill  . amitriptyline (ELAVIL) 150 MG tablet Take 1 tablet (150 mg total) by mouth at bedtime. 30 tablet 2  . atenolol (TENORMIN) 50 MG tablet TAKE 1 TABLET BY MOUTH ONCE DAILY 90 tablet 1  . atorvastatin (LIPITOR) 40 MG tablet TAKE 1 TABLET BY MOUTH ONCE DAILY 90 tablet 0  . busPIRone (BUSPAR) 30 MG tablet Take 30 mg by mouth 2 (two) times daily.    . clonazePAM (KLONOPIN) 0.5 MG tablet TAKE 1 TABLET BY MOUTH TWICE DAILY. 60 tablet 2  . furosemide (LASIX) 20 MG tablet TAKE 1 TABLET BY MOUTH ONCE DAILY. 90 tablet 0  . omeprazole (PRILOSEC) 40 MG capsule Take 1 capsule (40 mg total) by mouth daily. 90 capsule 1  . pregabalin (LYRICA) 75 MG capsule Take 1 capsule (75 mg total) by mouth 3 (three) times daily. 90 capsule 2  . QUEtiapine (SEROQUEL) 300 MG tablet TAKE 1 TABLET BY MOUTH DAILY AT BEDTIME. 90  tablet 1  . topiramate (TOPAMAX) 100 MG tablet TAKE 1 TABLET BY MOUTH THREE(3) TIMES DAILY 270 tablet 1  . Ubrogepant (UBRELVY) 100 MG TABS Take 100 mg by mouth daily as needed. 10 tablet 2  . Vitamin D, Ergocalciferol, (DRISDOL) 1.25 MG (50000 UNIT) CAPS capsule 1 po twice weekly 10 capsule 2  . azithromycin (ZITHROMAX) 250 MG tablet 2 po day one then one po days 2-5 6 each 0   No facility-administered medications prior to visit.    Allergies  Allergen Reactions  . Sumatriptan Hives and Rash  . Sulfa Antibiotics     Review of Systems  Constitutional: Negative for chills, fever and unexpected weight change.  HENT: Negative for congestion, ear discharge, ear pain, rhinorrhea and sore throat.   Respiratory: Negative for apnea, cough and shortness of breath.   Cardiovascular: Negative for chest pain and palpitations.  Gastrointestinal: Positive for constipation. Negative for abdominal pain, blood in stool, diarrhea, nausea and vomiting.  Endocrine: Positive for polydipsia. Negative for polyphagia.  Genitourinary: Negative for dysuria, frequency and hematuria.  Musculoskeletal: Positive for arthralgias and myalgias. Negative for back pain.  Skin: Negative for rash.  Neurological: Positive for weakness and headaches.       Objective:    Physical Exam Constitutional:      Appearance: Normal appearance.  Musculoskeletal:        General: Swelling and tenderness present.     Comments: Left ankle and foot has edema and tenderness over malleolus area.  Neurological:     Mental Status: She is alert.     BP 110/76   Pulse 72   Temp (!) 97.4 F (36.3 C)   Resp 18   Ht 5\' 2"  (1.575 m)   Wt 249 lb (112.9 kg)   BMI 45.54 kg/m  Wt Readings from Last 3 Encounters:  06/20/20 249 lb (112.9 kg)  05/22/20 245 lb (111.1 kg)  05/03/20 245 lb 12.8 oz (111.5 kg)    Health Maintenance Due  Topic Date Due  . Hepatitis C Screening  Never done  . COVID-19 Vaccine (1) Never done  . HIV  Screening  Never done  . TETANUS/TDAP  Never done  . PAP SMEAR-Modifier  Never done  . COLONOSCOPY  Never done    There are no preventive care reminders to display for this patient.   Lab Results  Component Value Date   TSH 2.480 05/03/2020   Lab Results  Component Value Date   WBC  7.5 05/03/2020   HGB 15.4 05/03/2020   HCT 46.6 05/03/2020   MCV 91 05/03/2020   PLT 345 05/03/2020   Lab Results  Component Value Date   NA 140 05/03/2020   K 4.2 05/03/2020   CO2 22 05/03/2020   GLUCOSE 113 (H) 05/03/2020   BUN 12 05/03/2020   CREATININE 0.90 05/03/2020   BILITOT 0.3 05/03/2020   ALKPHOS 198 (H) 05/03/2020   AST 23 05/03/2020   ALT 35 (H) 05/03/2020   PROT 7.6 05/03/2020   ALBUMIN 4.3 05/03/2020   CALCIUM 9.1 05/03/2020   Lab Results  Component Value Date   CHOL 228 (H) 05/03/2020   Lab Results  Component Value Date   HDL 39 (L) 05/03/2020   Lab Results  Component Value Date   LDLCALC 129 (H) 05/03/2020   Lab Results  Component Value Date   TRIG 335 (H) 05/03/2020   Lab Results  Component Value Date   CHOLHDL 5.8 (H) 05/03/2020   Lab Results  Component Value Date   HGBA1C 6.4 (H) 05/03/2020       Assessment & Plan:  1. Acute right ankle pain - DG Ankle Complete Right - Start Naproxen 500mg  2 times a day - Put some ice in the area affected - Wrap    2.  Insomnia Patient to return to see in December. I believe she also sees psychiatry. I asked her to call them for guidance.   Meds ordered this encounter  Medications  . naproxen (NAPROSYN) 500 MG tablet    Sig: Take 1 tablet (500 mg total) by mouth 2 (two) times daily with a meal.    Dispense:  60 tablet    Refill:  0    Orders Placed This Encounter  Procedures  . DG Ankle Complete Right     Follow-up: Return in about 8 weeks (around 08/14/2020) for fasting visit.  An After Visit Summary was printed and given to the patient.  08/16/2020 [facility] [phone]

## 2020-07-01 ENCOUNTER — Encounter: Payer: Self-pay | Admitting: Family Medicine

## 2020-07-04 ENCOUNTER — Other Ambulatory Visit: Payer: Self-pay | Admitting: Physician Assistant

## 2020-07-16 ENCOUNTER — Other Ambulatory Visit: Payer: Self-pay | Admitting: Family Medicine

## 2020-08-04 ENCOUNTER — Other Ambulatory Visit: Payer: Self-pay | Admitting: Physician Assistant

## 2020-08-04 DIAGNOSIS — F419 Anxiety disorder, unspecified: Secondary | ICD-10-CM

## 2020-08-10 ENCOUNTER — Other Ambulatory Visit: Payer: Self-pay | Admitting: Family Medicine

## 2020-08-14 ENCOUNTER — Ambulatory Visit (INDEPENDENT_AMBULATORY_CARE_PROVIDER_SITE_OTHER): Payer: Commercial Managed Care - PPO | Admitting: Physician Assistant

## 2020-08-14 ENCOUNTER — Encounter: Payer: Self-pay | Admitting: Physician Assistant

## 2020-08-14 ENCOUNTER — Other Ambulatory Visit: Payer: Self-pay

## 2020-08-14 VITALS — BP 124/82 | HR 93 | Temp 97.6°F | Ht 62.0 in | Wt 249.4 lb

## 2020-08-14 DIAGNOSIS — R0789 Other chest pain: Secondary | ICD-10-CM | POA: Diagnosis not present

## 2020-08-14 DIAGNOSIS — Z23 Encounter for immunization: Secondary | ICD-10-CM | POA: Diagnosis not present

## 2020-08-14 DIAGNOSIS — F419 Anxiety disorder, unspecified: Secondary | ICD-10-CM | POA: Diagnosis not present

## 2020-08-14 DIAGNOSIS — E782 Mixed hyperlipidemia: Secondary | ICD-10-CM

## 2020-08-14 DIAGNOSIS — I10 Essential (primary) hypertension: Secondary | ICD-10-CM | POA: Diagnosis not present

## 2020-08-14 DIAGNOSIS — E559 Vitamin D deficiency, unspecified: Secondary | ICD-10-CM

## 2020-08-14 DIAGNOSIS — R7303 Prediabetes: Secondary | ICD-10-CM

## 2020-08-14 LAB — TROPONIN T: Troponin T (Highly Sensitive): <6 ng/L (ref 0–14)

## 2020-08-14 MED ORDER — VITAMIN D (ERGOCALCIFEROL) 1.25 MG (50000 UNIT) PO CAPS: ORAL_CAPSULE | ORAL | 2 refills | Status: DC

## 2020-08-14 MED ORDER — FUROSEMIDE 20 MG PO TABS: 20.0000 mg | ORAL_TABLET | Freq: Every day | ORAL | 1 refills | Status: DC

## 2020-08-14 NOTE — Progress Notes (Signed)
Established Patient Office Visit  Subjective:  Patient ID: Cindy Hammond, female    DOB: Nov 24, 1965  Age: 54 y.o. MRN: [id number]  CC:  Chief Complaint  Patient presents with  . Hypertension    52M follow up    HPI Cigi Bega presents for follow up hyperlipidemia and chronic issues  Mixed hyperlipidemia  Pt presents with hyperlipidemia.  Compliance with treatment has been good; The patient is compliant with medications, maintains a low cholesterol diet , follows up as directed , and maintains an exercise regimen . The patient denies experiencing any hypercholesterolemia related symptoms.  Evidenced based information based on history , exam, and other sources has been used  for decision making. She is currently taking lipitor 40mg  qd  Pt presents for follow up of hypertension.  The patient is tolerating the medication well without side effects. Compliance with treatment has been good; including taking medication as directed , maintains a healthy diet and regular exercise regimen , and following up as directed. Patient was evaluated using exam, physical, labs and other information to perform evidence based decision making.  She is currently on atenolol 50mg  qd  Pt with history of GERD - stable on omeprazole 40mg  qd  Pt with history of anxiety - states her symptoms controlled well with clonazepam, amitriptyline, and buspar - voices no problems or concerns  Pt currently taking Vit D twice weekly - due for labwork  Pt states that several days ago she was sitting at home and had an episode of chest pressure that lasted about 10 minutes - no other associated symptoms She also mentions she had an episode where she woke up and could not get a good deep breath - with no associated coughing or wheezing   Pt would like to update tetanus shot today Past Medical History:  Diagnosis Date  . Anxiety   . Depression   . GERD (gastroesophageal reflux disease)   . Hyperlipidemia   . Hypertension    . Pain in joint, lower leg   . Thrombocytopenia (HCC)     Past Surgical History:  Procedure Laterality Date  . TUBAL LIGATION      Family History  Problem Relation Age of Onset  . Hyperlipidemia Mother   . Hypertension Mother   . Hyperlipidemia Father   . Hypertension Father     Social History   Socioeconomic History  . Marital status: Married    Spouse name: Not on file  . Number of children: 3  . Years of education: Not on file  . Highest education level: Not on file  Occupational History  . Occupation: Unemployed  Tobacco Use  . Smoking status: Former Smoker    Quit date: 10/03/1990    Years since quitting: 29.8  . Smokeless tobacco: Never Used  Vaping Use  . Vaping Use: Never used  Substance and Sexual Activity  . Alcohol use: No    Alcohol/week: 0.0 standard drinks  . Drug use: No  . Sexual activity: Yes    Partners: Male  Other Topics Concern  . Not on file  Social History Narrative  . Not on file   Social Determinants of Health   Financial Resource Strain: Not on file  Food Insecurity: Not on file  Transportation Needs: Not on file  Physical Activity: Not on file  Stress: Not on file  Social Connections: Not on file  Intimate Partner Violence: Not on file     Current Outpatient Medications:  .  amitriptyline (ELAVIL) 150  MG tablet, TAKE 1 TABLET BY MOUTH DAILY AT BEDTIME., Disp: 30 tablet, Rfl: 2 .  atenolol (TENORMIN) 50 MG tablet, TAKE 1 TABLET BY MOUTH ONCE DAILY, Disp: 90 tablet, Rfl: 1 .  atorvastatin (LIPITOR) 40 MG tablet, TAKE 1 TABLET BY MOUTH ONCE DAILY, Disp: 90 tablet, Rfl: 0 .  busPIRone (BUSPAR) 30 MG tablet, Take 30 mg by mouth 2 (two) times daily., Disp: , Rfl:  .  clonazePAM (KLONOPIN) 0.5 MG tablet, TAKE 1 TABLET BY MOUTH TWICE DAILY., Disp: 60 tablet, Rfl: 2 .  furosemide (LASIX) 20 MG tablet, TAKE 1 TABLET BY MOUTH ONCE DAILY., Disp: 90 tablet, Rfl: 0 .  naproxen (NAPROSYN) 500 MG tablet, TAKE 1 TABLET BY MOUTH TWICE DAILY  WITH MEALS, Disp: 60 tablet, Rfl: 0 .  omeprazole (PRILOSEC) 40 MG capsule, Take 1 capsule (40 mg total) by mouth daily., Disp: 90 capsule, Rfl: 1 .  pregabalin (LYRICA) 75 MG capsule, Take 1 capsule (75 mg total) by mouth 3 (three) times daily., Disp: 90 capsule, Rfl: 2 .  QUEtiapine (SEROQUEL) 300 MG tablet, TAKE 1 TABLET BY MOUTH DAILY AT BEDTIME., Disp: 90 tablet, Rfl: 1 .  topiramate (TOPAMAX) 100 MG tablet, TAKE 1 TABLET BY MOUTH THREE(3) TIMES DAILY, Disp: 270 tablet, Rfl: 1 .  Ubrogepant (UBRELVY) 100 MG TABS, Take 100 mg by mouth daily as needed., Disp: 10 tablet, Rfl: 2 .  Vitamin D, Ergocalciferol, (DRISDOL) 1.25 MG (50000 UNIT) CAPS capsule, 1 po twice weekly, Disp: 10 capsule, Rfl: 2   Allergies  Allergen Reactions  . Sumatriptan Hives and Rash  . Sulfa Antibiotics     ROS CONSTITUTIONAL: Negative for chills, fatigue, fever, unintentional weight gain and unintentional weight loss.  E/N/T: Negative for ear pain, nasal congestion and sore throat.  CARDIOVASCULAR: see HPI RESPIRATORY: Negative for recent cough and dyspnea.  GASTROINTESTINAL: Negative for abdominal pain, acid reflux symptoms, constipation, diarrhea, nausea and vomiting.  MSK: Negative for arthralgias and myalgias.  INTEGUMENTARY: Negative for rash.  NEUROLOGICAL: Negative for dizziness and headaches.  PSYCHIATRIC: Negative for sleep disturbance and to question depression screen.  Negative for depression, negative for anhedonia.            Objective:    PHYSICAL EXAM:   VS: BP 124/82 (BP Location: Left Arm, Patient Position: Sitting, Cuff Size: Normal)   Pulse 93   Temp 97.6 F (36.4 C) (Temporal)   Ht 5\' 2"  (1.575 m)   Wt 249 lb 6.4 oz (113.1 kg)   SpO2 96%   BMI 45.62 kg/m   GEN: Well nourished, well developed, in no acute distress  Cardiac: RRR; no murmurs, rubs, or gallops,no edema -  Respiratory:  normal respiratory rate and pattern with no distress - normal breath sounds with no rales,  rhonchi, wheezes or rubs MS: no deformity or atrophy  Skin: warm and dry, no rash  Neuro:  Alert and Oriented x 3, Strength and sensation are intact - CN II-Xii grossly intact Psych: euthymic mood, appropriate affect and demeanor   EKG - no acute abnormalities BP 124/82 (BP Location: Left Arm, Patient Position: Sitting, Cuff Size: Normal)   Pulse 93   Temp 97.6 F (36.4 C) (Temporal)   Ht 5\' 2"  (1.575 m)   Wt 249 lb 6.4 oz (113.1 kg)   SpO2 96%   BMI 45.62 kg/m  Wt Readings from Last 3 Encounters:  08/14/20 249 lb 6.4 oz (113.1 kg)  06/20/20 249 lb (112.9 kg)  05/22/20 245 lb (111.1 kg)  Health Maintenance Due  Topic Date Due  . Hepatitis C Screening  Never done  . COVID-19 Vaccine (1) Never done  . HIV Screening  Never done  . PAP SMEAR-Modifier  Never done  . COLONOSCOPY  Never done    There are no preventive care reminders to display for this patient.  Lab Results  Component Value Date   TSH 2.480 05/03/2020   Lab Results  Component Value Date   WBC 7.5 05/03/2020   HGB 15.4 05/03/2020   HCT 46.6 05/03/2020   MCV 91 05/03/2020   PLT 345 05/03/2020   Lab Results  Component Value Date   NA 140 05/03/2020   K 4.2 05/03/2020   CO2 22 05/03/2020   GLUCOSE 113 (H) 05/03/2020   BUN 12 05/03/2020   CREATININE 0.90 05/03/2020   BILITOT 0.3 05/03/2020   ALKPHOS 198 (H) 05/03/2020   AST 23 05/03/2020   ALT 35 (H) 05/03/2020   PROT 7.6 05/03/2020   ALBUMIN 4.3 05/03/2020   CALCIUM 9.1 05/03/2020   Lab Results  Component Value Date   CHOL 228 (H) 05/03/2020   Lab Results  Component Value Date   HDL 39 (L) 05/03/2020   Lab Results  Component Value Date   LDLCALC 129 (H) 05/03/2020   Lab Results  Component Value Date   TRIG 335 (H) 05/03/2020   Lab Results  Component Value Date   CHOLHDL 5.8 (H) 05/03/2020   Lab Results  Component Value Date   HGBA1C 6.4 (H) 05/03/2020      Assessment & Plan:   Problem List Items Addressed This Visit       Cardiovascular and Mediastinum   Benign hypertension   Relevant Orders   CBC with Differential/Platelet   Comprehensive metabolic panel Continue current meds as directed     Other   Anxiety Continue meds   Vitamin D deficiency   Relevant Orders   VITAMIN D 25 Hydroxy (Vit-D Deficiency, Fractures) Refill of med given   Mixed hyperlipidemia   Relevant Orders   Lipid panel Watch diet - continue meds   Prediabetes   Relevant Orders   Hemoglobin A1c       Other Visit Diagnoses    Other chest pain    -  Primary   Relevant Orders   EKG 12-Lead   Ambulatory referral to Cardiology   Troponin T   Need for prophylactic vaccination and inoculation against cholera alone       Need for tetanus, diphtheria, and acellular pertussis (Tdap) vaccine       Relevant Orders   Tdap vaccine greater than or equal to 7yo IM (Completed)      No orders of the defined types were placed in this encounter.   Follow-up: Return in about 3 months (around 11/12/2020) for chronic fasting.    SARA R Junice Fei, PA-C

## 2020-08-15 DIAGNOSIS — M25569 Pain in unspecified knee: Secondary | ICD-10-CM | POA: Insufficient documentation

## 2020-08-15 DIAGNOSIS — D696 Thrombocytopenia, unspecified: Secondary | ICD-10-CM | POA: Insufficient documentation

## 2020-08-15 DIAGNOSIS — K219 Gastro-esophageal reflux disease without esophagitis: Secondary | ICD-10-CM | POA: Insufficient documentation

## 2020-08-15 DIAGNOSIS — I10 Essential (primary) hypertension: Secondary | ICD-10-CM | POA: Insufficient documentation

## 2020-08-15 DIAGNOSIS — F32A Depression, unspecified: Secondary | ICD-10-CM | POA: Insufficient documentation

## 2020-08-15 DIAGNOSIS — E785 Hyperlipidemia, unspecified: Secondary | ICD-10-CM | POA: Insufficient documentation

## 2020-08-15 LAB — CBC WITH DIFFERENTIAL/PLATELET
Basophils Absolute: 0.1 10*3/uL (ref 0.0–0.2)
Basos: 1 %
EOS (ABSOLUTE): 0.2 10*3/uL (ref 0.0–0.4)
Eos: 4 %
Hematocrit: 43.3 % (ref 34.0–46.6)
Hemoglobin: 14.8 g/dL (ref 11.1–15.9)
Immature Grans (Abs): 0 10*3/uL (ref 0.0–0.1)
Immature Granulocytes: 0 %
Lymphocytes Absolute: 1.3 10*3/uL (ref 0.7–3.1)
Lymphs: 21 %
MCH: 30.6 pg (ref 26.6–33.0)
MCHC: 34.2 g/dL (ref 31.5–35.7)
MCV: 90 fL (ref 79–97)
Monocytes Absolute: 0.6 10*3/uL (ref 0.1–0.9)
Monocytes: 9 %
Neutrophils Absolute: 4.1 10*3/uL (ref 1.4–7.0)
Neutrophils: 65 %
Platelets: 297 10*3/uL (ref 150–450)
RBC: 4.83 x10E6/uL (ref 3.77–5.28)
RDW: 13.7 % (ref 11.7–15.4)
WBC: 6.3 10*3/uL (ref 3.4–10.8)

## 2020-08-15 LAB — COMPREHENSIVE METABOLIC PANEL
ALT: 43 IU/L — ABNORMAL HIGH (ref 0–32)
AST: 28 IU/L (ref 0–40)
Albumin/Globulin Ratio: 1.3 (ref 1.2–2.2)
Albumin: 4 g/dL (ref 3.8–4.9)
Alkaline Phosphatase: 203 IU/L — ABNORMAL HIGH (ref 44–121)
BUN/Creatinine Ratio: 12 (ref 9–23)
BUN: 10 mg/dL (ref 6–24)
Bilirubin Total: 0.4 mg/dL (ref 0.0–1.2)
CO2: 22 mmol/L (ref 20–29)
Calcium: 8.7 mg/dL (ref 8.7–10.2)
Chloride: 106 mmol/L (ref 96–106)
Creatinine, Ser: 0.82 mg/dL (ref 0.57–1.00)
GFR calc Af Amer: 94 mL/min/{1.73_m2} (ref >59)
GFR calc non Af Amer: 82 mL/min/{1.73_m2} (ref >59)
Globulin, Total: 3 g/dL (ref 1.5–4.5)
Glucose: 113 mg/dL — ABNORMAL HIGH (ref 65–99)
Potassium: 4 mmol/L (ref 3.5–5.2)
Sodium: 142 mmol/L (ref 134–144)
Total Protein: 7 g/dL (ref 6.0–8.5)

## 2020-08-15 LAB — VITAMIN D 25 HYDROXY (VIT D DEFICIENCY, FRACTURES): Vit D, 25-Hydroxy: 39.1 ng/mL (ref 30.0–100.0)

## 2020-08-15 LAB — LIPID PANEL
Chol/HDL Ratio: 4.6 ratio — ABNORMAL HIGH (ref 0.0–4.4)
Cholesterol, Total: 193 mg/dL (ref 100–199)
HDL: 42 mg/dL (ref >39)
LDL Chol Calc (NIH): 108 mg/dL — ABNORMAL HIGH (ref 0–99)
Triglycerides: 248 mg/dL — ABNORMAL HIGH (ref 0–149)
VLDL Cholesterol Cal: 43 mg/dL — ABNORMAL HIGH (ref 5–40)

## 2020-08-15 LAB — CARDIOVASCULAR RISK ASSESSMENT

## 2020-08-15 LAB — HEMOGLOBIN A1C
Est. average glucose Bld gHb Est-mCnc: 128 mg/dL
Hgb A1c MFr Bld: 6.1 % — ABNORMAL HIGH (ref 4.8–5.6)

## 2020-08-17 ENCOUNTER — Other Ambulatory Visit: Payer: Self-pay

## 2020-08-17 ENCOUNTER — Ambulatory Visit (INDEPENDENT_AMBULATORY_CARE_PROVIDER_SITE_OTHER): Payer: Commercial Managed Care - PPO | Admitting: Cardiology

## 2020-08-17 ENCOUNTER — Encounter: Payer: Self-pay | Admitting: Cardiology

## 2020-08-17 VITALS — BP 110/82 | HR 75 | Ht 62.0 in | Wt 250.0 lb

## 2020-08-17 DIAGNOSIS — I10 Essential (primary) hypertension: Secondary | ICD-10-CM | POA: Diagnosis not present

## 2020-08-17 DIAGNOSIS — R072 Precordial pain: Secondary | ICD-10-CM

## 2020-08-17 DIAGNOSIS — R0789 Other chest pain: Secondary | ICD-10-CM

## 2020-08-17 DIAGNOSIS — R0602 Shortness of breath: Secondary | ICD-10-CM

## 2020-08-17 DIAGNOSIS — E782 Mixed hyperlipidemia: Secondary | ICD-10-CM

## 2020-08-17 HISTORY — DX: Precordial pain: R07.2

## 2020-08-17 HISTORY — DX: Morbid (severe) obesity due to excess calories: E66.01

## 2020-08-17 HISTORY — DX: Shortness of breath: R06.02

## 2020-08-17 MED ORDER — NITROGLYCERIN 0.4 MG SL SUBL: 0.4000 mg | SUBLINGUAL_TABLET | SUBLINGUAL | 3 refills | Status: DC | PRN

## 2020-08-17 NOTE — Progress Notes (Signed)
Cardiology Office Note:    Date:  08/17/2020   ID:  Cindy Hammond, DOB [birth year], MRN [id number]  PCP:  Marge Duncans, PA-C  Cardiologist:  No primary care provider on file.  Electrophysiologist:  None   Referring MD: Marge Duncans, PA-C   " I am experiencing chest pain"  History of Present Illness:    Cindy Hammond is a 54 y.o. female with a hx of hyperlipidemia, hypertension, GERD, morbid obesity is here today at the request of her PCP.  Patient did see her PCP and reported that she has been experiencing several days of chest pressure.  She described as a midsternal sensation by feeling which lasted for 10 minutes and then resolved.  The patient reports that is been intermittent off and on.  Is associated sometimes with shortness of breath.  And she notes that she has had episodes where she was sleeping and wakes up due to the chest pain.  Past Medical History:  Diagnosis Date  . Anxiety   . Depression   . GERD (gastroesophageal reflux disease)   . Hyperlipidemia   . Hypertension   . Pain in joint, lower leg   . Thrombocytopenia ([place])     Past Surgical History:  Procedure Laterality Date  . TUBAL LIGATION      Current Medications: No outpatient medications have been marked as taking for the 08/17/20 encounter (Office Visit) with Berniece Salines, DO.     Allergies:   Sumatriptan and Sulfa antibiotics   Social History   Socioeconomic History  . Marital status: Married    Spouse name: Not on file  . Number of children: 3  . Years of education: Not on file  . Highest education level: Not on file  Occupational History  . Occupation: Unemployed  Tobacco Use  . Smoking status: Former Smoker    Quit date: 10/03/1990    Years since quitting: 29.8  . Smokeless tobacco: Never Used  Vaping Use  . Vaping Use: Never used  Substance and Sexual Activity  . Alcohol use: No    Alcohol/week: 0.0 standard drinks  . Drug use: No  . Sexual activity: Yes    Partners: Male  Other Topics  Concern  . Not on file  Social History Narrative  . Not on file   Social Determinants of Health   Financial Resource Strain: Not on file  Food Insecurity: Not on file  Transportation Needs: Not on file  Physical Activity: Not on file  Stress: Not on file  Social Connections: Not on file     Family History: The patient's family history includes Hyperlipidemia in her father and mother; Hypertension in her father and mother.  ROS:   Review of Systems  Constitution: Negative for decreased appetite, fever and weight gain.  HENT: Negative for congestion, ear discharge, hoarse voice and sore throat.   Eyes: Negative for discharge, redness, vision loss in right eye and visual halos.  Cardiovascular: Negative for chest pain, dyspnea on exertion, leg swelling, orthopnea and palpitations.  Respiratory: Negative for cough, hemoptysis, shortness of breath and snoring.   Endocrine: Negative for heat intolerance and polyphagia.  Hematologic/Lymphatic: Negative for bleeding problem. Does not bruise/bleed easily.  Skin: Negative for flushing, nail changes, rash and suspicious lesions.  Musculoskeletal: Negative for arthritis, joint pain, muscle cramps, myalgias, neck pain and stiffness.  Gastrointestinal: Negative for abdominal pain, bowel incontinence, diarrhea and excessive appetite.  Genitourinary: Negative for decreased libido, genital sores and incomplete emptying.  Neurological: Negative for brief paralysis,  focal weakness, headaches and loss of balance.  Psychiatric/Behavioral: Negative for altered mental status, depression and suicidal ideas.  Allergic/Immunologic: Negative for HIV exposure and persistent infections.    EKGs/Labs/Other Studies Reviewed:    The following studies were reviewed today:   EKG:  The ekg ordered today demonstrates sinus rhythm, heart rate 75 bpm.  Recent Labs: 05/03/2020: TSH 2.480 08/14/2020: ALT 43; BUN 10; Creatinine, Ser 0.82; Hemoglobin 14.8;  Platelets 297; Potassium 4.0; Sodium 142  Recent Lipid Panel    Component Value Date/Time   CHOL 193 08/14/2020 1101   TRIG 248 (H) 08/14/2020 1101   HDL 42 08/14/2020 1101   CHOLHDL 4.6 (H) 08/14/2020 1101   LDLCALC 108 (H) 08/14/2020 1101    Physical Exam:    VS:  BP 110/82 (BP Location: Left Arm)   Pulse 75   Ht _0  (1.575 m)   Wt 250 lb (113.4 kg)   SpO2 98%   BMI 45.73 kg/m     Wt Readings from Last 3 Encounters:  08/17/20 250 lb (113.4 kg)  08/14/20 249 lb 6.4 oz (113.1 kg)  06/20/20 249 lb (112.9 kg)     GEN: Well nourished obese female, well developed in no acute distress HEENT: Normal NECK: No JVD; No carotid bruits LYMPHATICS: No lymphadenopathy CARDIAC: S1S2 noted,RRR, no murmurs, rubs, gallops RESPIRATORY:  Clear to auscultation without rales, wheezing or rhonchi  ABDOMEN: Soft, non-tender, non-distended, +bowel sounds, no guarding. EXTREMITIES: No edema, No cyanosis, no clubbing MUSCULOSKELETAL:  No deformity  SKIN: Warm and dry NEUROLOGIC:  Alert and oriented x 3, non-focal PSYCHIATRIC:  Normal affect, good insight  ASSESSMENT:    1. Precordial pain   2. Morbid obesity ([place])   3. Shortness of breath   4. Benign hypertension   5. Mixed hyperlipidemia   6. Other chest pain    PLAN:     1.  Her chest pain is concerning given her risk factors for coronary artery disease (hypertension, hyperlipidemia, obesity, menopause) I like to proceed with ischemic evaluation in this patient.  We discussed multiple testing modalities-shared decision a coronary CTA at this time.  She has no IV contrast allergies.  She is agreeable to proceed with this testing.  Sublingual nitroglycerin prescription was sent, its protocol and 911 protocol explained and the patient vocalized understanding questions and all of her questions has been answered at this time the above testing as well as the use of nitroglycerin.  For completeness given her shortness of breath I like to  order an echocardiogram for assessment of RV/LV function and any other structural abnormalities.  The patient understands the need to lose weight with diet and exercise. We have discussed specific strategies for this. Blood pressure acceptable today no changes will be made to her antihypertensive regimen.  Continue patient on her same lipid-lowering agent.  The patient is in agreement with the above plan. The patient left the office in stable condition.  The patient will follow up in 3 months with sooner if needed.   Medication Adjustments/Labs and Tests Ordered: Current medicines are reviewed at length with the patient today.  Concerns regarding medicines are outlined above.  Orders Placed This Encounter  Procedures  . CT CORONARY MORPH W/CTA COR W/SCORE W/CA W/CM &/OR WO/CM  . CT CORONARY FRACTIONAL FLOW RESERVE DATA PREP  . CT CORONARY FRACTIONAL FLOW RESERVE FLUID ANALYSIS  . Basic metabolic panel  . EKG 12-Lead  . ECHOCARDIOGRAM COMPLETE   Meds ordered this encounter  Medications  .  nitroGLYCERIN (NITROSTAT) 0.4 MG SL tablet    Sig: Place 1 tablet (0.4 mg total) under the tongue every 5 (five) minutes as needed for chest pain.    Dispense:  25 tablet    Refill:  3    Patient Instructions  Medication Instructions:  Start Nitroglycerin 0.4 mg every 5 minutes as needed for chest pain   *If you need a refill on your cardiac medications before your next appointment, please call your pharmacy*   Lab Work: Your physician recommends that you return for lab work 1 week before your CT  If you have labs (blood work) drawn today and your tests are completely normal, you will receive your results only by: Marland Kitchen MyChart Message (if you have MyChart) OR . A paper copy in the mail If you have any lab test that is abnormal or we need to change your treatment, we will call you to review the results.   Testing/Procedures: Your physician has requested that you have an echocardiogram.  Echocardiography is a painless test that uses sound waves to create images of your heart. It provides your doctor with information about the size and shape of your heart and how well your heart's chambers and valves are working. This procedure takes approximately one hour. There are no restrictions for this procedure.  Your physician has ordered for you to have a coronary cta    Follow-Up: At [facility], you and your health needs are our priority.  As part of our continuing mission to provide you with exceptional heart care, we have created designated Provider Care Teams.  These Care Teams include your primary Cardiologist (physician) and Advanced Practice Providers (APPs -  Physician Assistants and Nurse Practitioners) who all work together to provide you with the care you need, when you need it.  We recommend signing up for the patient portal called "MyChart".  Sign up information is provided on this After Visit Summary.  MyChart is used to connect with patients for Virtual Visits (Telemedicine).  Patients are able to view lab/test results, encounter notes, upcoming appointments, etc.  Non-urgent messages can be sent to your provider as well.   To learn more about what you can do with MyChart, go to [email or web address].    Your next appointment:   3 month(s)  The format for your next appointment:   In Person  Provider:   Berniece Salines, DO   Other Instructions Your cardiac CT will be scheduled at one of the below locations:   [facility] [address] [place], [place] [address] [phone]  If scheduled at [facility], please arrive at the [facility] main entrance of [facility] 30 minutes prior to test start time. Proceed to the [facility] Radiology Department (first floor) to check-in and test prep.   Please follow these instructions carefully (unless otherwise directed):   On the Night Before the Test: . Be sure to Drink plenty of  water. . Do not consume any caffeinated/decaffeinated beverages or chocolate 12 hours prior to your test. . Do not take any antihistamines 12 hours prior to your test.  On the Day of the Test: . Drink plenty of water. Do not drink any water within one hour of the test. . Do not eat any food 4 hours prior to the test. . You may take your regular medications prior to the test.  . Take Atenolol  two hours prior to test. . HOLD Furosemide morning of the test. . FEMALES-  please wear underwire-free bra if available       After the Test: . Drink plenty of water. . After receiving IV contrast, you may experience a mild flushed feeling. This is normal. . On occasion, you may experience a mild rash up to 24 hours after the test. This is not dangerous. If this occurs, you can take Benadryl 25 mg and increase your fluid intake. . If you experience trouble breathing, this can be serious. If it is severe call 911 IMMEDIATELY. If it is mild, please call our office.   Once we have confirmed authorization from your insurance company, we will call you to set up a date and time for your test. Based on how quickly your insurance processes prior authorizations requests, please allow up to 4 weeks to be contacted for scheduling your Cardiac CT appointment. Be advised that routine Cardiac CT appointments could be scheduled as many as 8 weeks after your provider has ordered it.  For non-scheduling related questions, please contact the cardiac imaging nurse navigator should you have any questions/concerns: Marchia Bond, Cardiac Imaging Nurse Navigator Burley Saver, Interim Cardiac Imaging Nurse [place] and Vascular Services Direct Office Dial: [phone]   For scheduling needs, including cancellations and rescheduling, please call [place], [phone].       Adopting a Healthy Lifestyle.  Know what a healthy weight is for you (roughly BMI <25) and aim to maintain this   Aim for 7+  servings of fruits and vegetables daily   65-80+ fluid ounces of water or unsweet tea for healthy kidneys   Limit to max 1 drink of alcohol per day; avoid smoking/tobacco   Limit animal fats in diet for cholesterol and heart health - choose grass fed whenever available   Avoid highly processed foods, and foods high in saturated/trans fats   Aim for low stress - take time to unwind and care for your mental health   Aim for 150 min of moderate intensity exercise weekly for heart health, and weights twice weekly for bone health   Aim for 7-9 hours of sleep daily   When it comes to diets, agreement about the perfect plan isnt easy to find, even among the experts. Experts at the [employer] developed an idea known as the Healthy Eating Plate. Just imagine a plate divided into logical, healthy portions.   The emphasis is on diet quality:   Load up on vegetables and fruits - one-half of your plate: Aim for color and variety, and remember that potatoes dont count.   Go for whole grains - one-quarter of your plate: Whole wheat, barley, wheat berries, quinoa, oats, brown rice, and foods made with them. If you want pasta, go with whole wheat pasta.   Protein power - one-quarter of your plate: Fish, chicken, beans, and nuts are all healthy, versatile protein sources. Limit red meat.   The diet, however, does go beyond the plate, offering a few other suggestions.   Use healthy plant oils, such as olive, canola, soy, corn, sunflower and peanut. Check the labels, and avoid partially hydrogenated oil, which have unhealthy trans fats.   If youre thirsty, drink water. Coffee and tea are good in moderation, but skip sugary drinks and limit milk and dairy products to one or two daily servings.   The type of carbohydrate in the diet is more important than the amount. Some sources of carbohydrates, such as vegetables, fruits, whole grains, and beans-are healthier than others.  Finally, stay active  Signed, Berniece Salines, DO  08/17/2020 3:06 PM    [facility]

## 2020-08-17 NOTE — Patient Instructions (Addendum)
Medication Instructions:  Start Nitroglycerin 0.4 mg every 5 minutes as needed for chest pain   *If you need a refill on your cardiac medications before your next appointment, please call your pharmacy*   Lab Work: Your physician recommends that you return for lab work 1 week before your CT  If you have labs (blood work) drawn today and your tests are completely normal, you will receive your results only by: Marland Kitchen MyChart Message (if you have MyChart) OR . A paper copy in the mail If you have any lab test that is abnormal or we need to change your treatment, we will call you to review the results.   Testing/Procedures: Your physician has requested that you have an echocardiogram. Echocardiography is a painless test that uses sound waves to create images of your heart. It provides your doctor with information about the size and shape of your heart and how well your heart's chambers and valves are working. This procedure takes approximately one hour. There are no restrictions for this procedure.  Your physician has ordered for you to have a coronary cta    Follow-Up: At [facility], you and your health needs are our priority.  As part of our continuing mission to provide you with exceptional heart care, we have created designated Provider Care Teams.  These Care Teams include your primary Cardiologist (physician) and Advanced Practice Providers (APPs -  Physician Assistants and Nurse Practitioners) who all work together to provide you with the care you need, when you need it.  We recommend signing up for the patient portal called "MyChart".  Sign up information is provided on this After Visit Summary.  MyChart is used to connect with patients for Virtual Visits (Telemedicine).  Patients are able to view lab/test results, encounter notes, upcoming appointments, etc.  Non-urgent messages can be sent to your provider as well.   To learn more about what you can do with MyChart, go to  [email or web address].    Your next appointment:   3 month(s)  The format for your next appointment:   In Person  Provider:   Berniece Salines, DO   Other Instructions Your cardiac CT will be scheduled at one of the below locations:   [facility] [address] [place], [place] [address] [phone]  If scheduled at [facility], please arrive at the [facility] main entrance of [facility] 30 minutes prior to test start time. Proceed to the [facility] Radiology Department (first floor) to check-in and test prep.   Please follow these instructions carefully (unless otherwise directed):   On the Night Before the Test: . Be sure to Drink plenty of water. . Do not consume any caffeinated/decaffeinated beverages or chocolate 12 hours prior to your test. . Do not take any antihistamines 12 hours prior to your test.  On the Day of the Test: . Drink plenty of water. Do not drink any water within one hour of the test. . Do not eat any food 4 hours prior to the test. . You may take your regular medications prior to the test.  . Take Atenolol  two hours prior to test. . HOLD Furosemide morning of the test. . FEMALES- please wear underwire-free bra if available       After the Test: . Drink plenty of water. . After receiving IV contrast, you may experience a mild flushed feeling. This is normal. . On occasion, you may experience a mild rash up to 24 hours  after the test. This is not dangerous. If this occurs, you can take Benadryl 25 mg and increase your fluid intake. . If you experience trouble breathing, this can be serious. If it is severe call 911 IMMEDIATELY. If it is mild, please call our office.   Once we have confirmed authorization from your insurance company, we will call you to set up a date and time for your test. Based on how quickly your insurance processes prior authorizations requests, please allow up to 4 weeks to be contacted for  scheduling your Cardiac CT appointment. Be advised that routine Cardiac CT appointments could be scheduled as many as 8 weeks after your provider has ordered it.  For non-scheduling related questions, please contact the cardiac imaging nurse navigator should you have any questions/concerns: Marchia Bond, Cardiac Imaging Nurse Navigator Burley Saver, Interim Cardiac Imaging Nurse [place] and Vascular Services Direct Office Dial: [phone]   For scheduling needs, including cancellations and rescheduling, please call [place], [phone].

## 2020-08-24 ENCOUNTER — Other Ambulatory Visit: Payer: Self-pay | Admitting: Family Medicine

## 2020-08-29 ENCOUNTER — Other Ambulatory Visit: Payer: Self-pay | Admitting: Physician Assistant

## 2020-09-05 ENCOUNTER — Telehealth: Payer: Self-pay

## 2020-09-05 ENCOUNTER — Other Ambulatory Visit: Payer: Self-pay | Admitting: Family Medicine

## 2020-09-05 NOTE — Telephone Encounter (Signed)
Pt called sts she is having SOB, diarrhea,and fatigue. No chest pain. Sts the SOB has been going on and she is seeing a heart doctor for it but the diarrhea and fatigue just started.  Talk with Dr. Kennon Rounds and she sts pt should go to the ER with her health problems, and the short of breath she needs to be seen immediatelty.   Notified pt of information, verbalized understanding.

## 2020-09-06 ENCOUNTER — Telehealth (HOSPITAL_COMMUNITY): Payer: Self-pay | Admitting: *Deleted

## 2020-09-06 NOTE — Telephone Encounter (Signed)
Attempted to call patient regarding upcoming cardiac CT appointment. Left message on voicemail with name and callback number  Aryia Delira RN Navigator Cardiac Imaging  Heart and Vascular Services [phone] Office [phone] Cell  

## 2020-09-07 ENCOUNTER — Telehealth (HOSPITAL_COMMUNITY): Payer: Self-pay | Admitting: *Deleted

## 2020-09-07 NOTE — Telephone Encounter (Signed)
Reaching out to patient to offer assistance regarding upcoming cardiac imaging study; pt verbalizes understanding of appt date/time, parking situation and where to check in, pre-test NPO status and medications ordered, and verified current allergies; name and call back number provided for further questions should they arise  Magdalyn Arenivas RN Navigator Cardiac Imaging [place] [facility] [phone] office [phone] cell  

## 2020-09-10 ENCOUNTER — Ambulatory Visit (HOSPITAL_COMMUNITY)
Admission: RE | Admit: 2020-09-10 | Discharge: 2020-09-10 | Disposition: A | Discharge status: Home / Self Care | Payer: Commercial Managed Care - PPO | Source: Ambulatory Visit | Attending: Cardiology | Admitting: Cardiology

## 2020-09-10 ENCOUNTER — Encounter: Payer: Commercial Managed Care - PPO | Admitting: *Deleted

## 2020-09-10 ENCOUNTER — Other Ambulatory Visit: Payer: Self-pay

## 2020-09-10 DIAGNOSIS — Z006 Encounter for examination for normal comparison and control in clinical research program: Secondary | ICD-10-CM

## 2020-09-10 DIAGNOSIS — R072 Precordial pain: Secondary | ICD-10-CM | POA: Diagnosis not present

## 2020-09-10 DIAGNOSIS — R0789 Other chest pain: Secondary | ICD-10-CM | POA: Insufficient documentation

## 2020-09-10 IMAGING — CT CT HEART MORP W/ CTA COR W/ SCORE W/ CA W/CM &/OR W/O CM
3 of 9 series · 6 of 20 positions shown, 7 images · IV contrast (APPLIED)
Comparison: 01/12/2019 chest radiograph from [REDACTED].
COMPARISON: 01/12/2019 chest radiograph from [REDACTED].

Addendum:
EXAM:
OVER-READ INTERPRETATION  CT CHEST

The following report is an over-read performed by radiologist Dr.
Likhona Omhle [REDACTED] on 09/10/2020. This over-read
does not include interpretation of cardiac or coronary anatomy or
pathology. The coronary CTA interpretation by the cardiologist is
attached.
CLINICAL DATA: This is a 54 year old female with chest pain.
Cardiac/Coronary  CT
TECHNIQUE: The patient was scanned on a [employer] Force scanner.

[Series 9: best syst · axial · 0.34mm/px · z∈[-55,-17]mm · 2 of 279 slices shown, 3 images (1 of 2)]
[im 93/279  vessel]
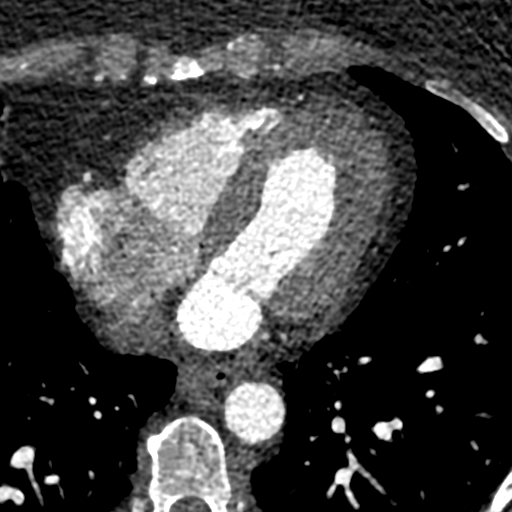
[im 93/279  lung]
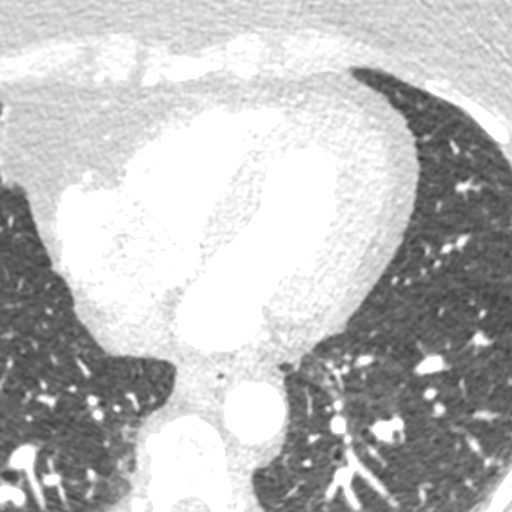
[im 186/279  vessel]
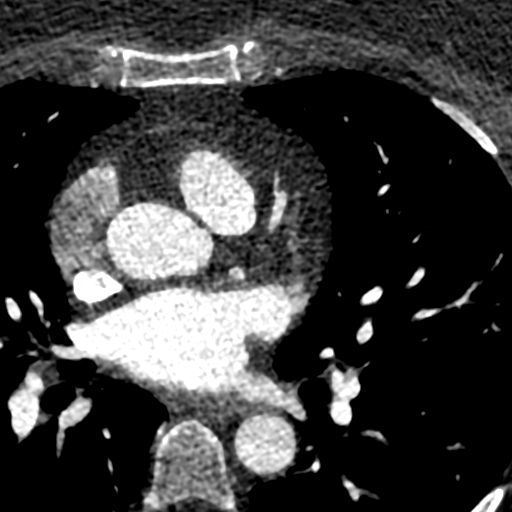

[Series 17: best diast 71 % · axial · 0.34mm/px · z∈[-55,-17]mm · 2 of 279 slices shown]
[im 93/279  vessel]
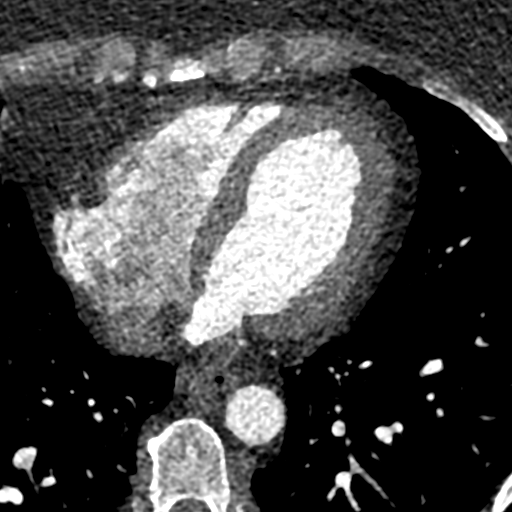
[im 186/279  vessel]
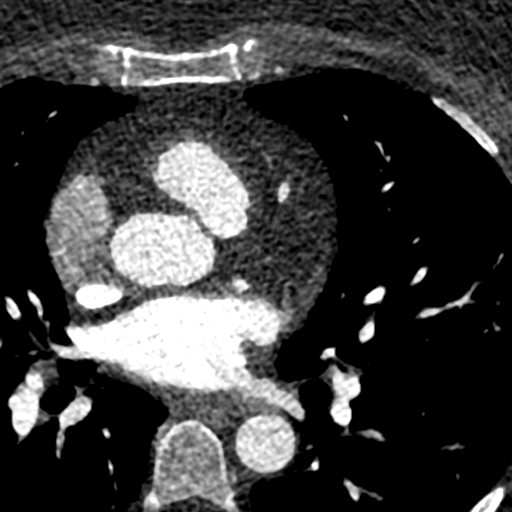

[Series 18: best syst · axial · 0.34mm/px · z∈[-55,-17]mm · 2 of 279 slices shown (2 of 2)]
[im 93/279  vessel]
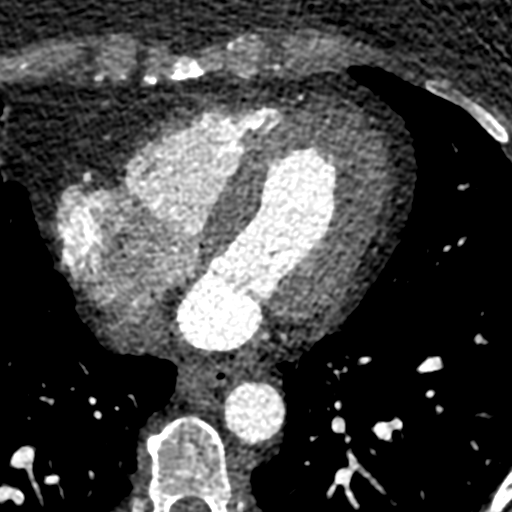
[im 186/279  vessel]
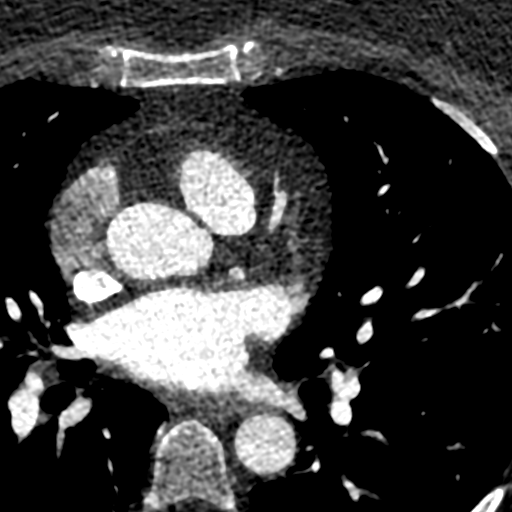

[6 of 20 positions shown; findings below may reference images not displayed]

FINDINGS: Vascular: Aortic atherosclerosis. No central pulmonary embolism, on
this non-dedicated study.

Mediastinum/Nodes: No imaged thoracic adenopathy.

Lungs/Pleura: No pleural fluid. 4 mm right middle lobe pulmonary
nodule on 21/15.

3 mm right lower lobe pulmonary nodule on 28/15.

A 4 mm lingular nodule on [DATE].

Upper Abdomen: Hepatic steatosis. Normal imaged portions of the
spleen.

Musculoskeletal: No acute osseous abnormality.
IMPRESSION: No acute findings in the imaged extracardiac chest.

Bilateral pulmonary nodules of maximally 4 mm. No follow-up needed
if patient is low-risk. Non-contrast chest CT can be considered in
12 months if patient is high-risk. This recommendation follows the
consensus statement: Guidelines for Management of Incidental
Pulmonary Nodules Detected on CT Images: From the [HOSPITAL]

Aortic Atherosclerosis (G2EUU-V2N.N).
FINDINGS: A 120 kV retrospective scan was triggered in the descending thoracic
aorta at 111 HU's. Axial non-contrast 3 mm slices were carried out
through the heart. The data set was analyzed on a dedicated work
station and scored using the Agatson method. Gantry rotation speed
was 250 msecs and collimation was .6 mm. No beta blockade and 0.8 mg
of sl NTG was given. The 3D data set was reconstructed in 5%
intervals of the 67-82 % of the R-R cycle. Diastolic phases were
analyzed on a dedicated work station using MPR, MIP and VRT modes.
The patient received 80 cc of contrast.

Aorta: Normal size.  No calcifications.  No dissection.

Aortic Valve:  Trileaflet.  No calcifications.

Coronary Arteries:  Normal coronary origin.  Right dominance.

RCA is a large dominant artery that gives rise to PDA and PLVB.
There is no plaque.

Left main is a large artery that gives rise to LAD and LCX arteries.

LAD is a large vessel. There is minimal soft plaque in the mid LAD
however this is not fully quantified due to stair-step artifact-
will send for FFRct to assess flow.

LCX is a non-dominant artery that gives rise to one large OM1
branch. There is minimal calcified plaque in the proximal LCX.

Other findings:

Normal pulmonary vein drainage into the left atrium.

Normal left atrial appendage without a thrombus.

Normal size of the pulmonary artery.
IMPRESSION: 1. Coronary calcium score of 18.2. This was 88 percentile for age
and sex matched control.

2. Normal coronary origin with right dominance.

3.Minimal CAD. CADRADS 1. This study will be send for FFRct due to
poor quantification the suspected minimal soft LAD plaque.

Abomhamad Abs, DO

*** End of Addendum ***
EXAM:
OVER-READ INTERPRETATION  CT CHEST

The following report is an over-read performed by radiologist Dr.
Lkw Tiger [REDACTED] on 09/10/2020. This over-read
does not include interpretation of cardiac or coronary anatomy or
pathology. The coronary CTA interpretation by the cardiologist is
attached.
FINDINGS: Vascular: Aortic atherosclerosis. No central pulmonary embolism, on
this non-dedicated study.

Mediastinum/Nodes: No imaged thoracic adenopathy.

Lungs/Pleura: No pleural fluid. 4 mm right middle lobe pulmonary
nodule on 21/15.

3 mm right lower lobe pulmonary nodule on 28/15.

A 4 mm lingular nodule on [DATE].

Upper Abdomen: Hepatic steatosis. Normal imaged portions of the
spleen.

Musculoskeletal: No acute osseous abnormality.
IMPRESSION: No acute findings in the imaged extracardiac chest.

Bilateral pulmonary nodules of maximally 4 mm. No follow-up needed
if patient is low-risk. Non-contrast chest CT can be considered in
12 months if patient is high-risk. This recommendation follows the
consensus statement: Guidelines for Management of Incidental
Pulmonary Nodules Detected on CT Images: From the [HOSPITAL]

Aortic Atherosclerosis (G2EUU-V2N.N).

## 2020-09-10 MED ORDER — NITROGLYCERIN 0.4 MG SL SUBL
0.8000 mg | SUBLINGUAL_TABLET | Freq: Once | SUBLINGUAL | Status: AC
  Administered 2020-09-10: 0.8 mg via SUBLINGUAL

## 2020-09-10 MED ORDER — IOHEXOL 350 MG/ML SOLN
90.0000 mL | Freq: Once | INTRAVENOUS | Status: AC | PRN
  Administered 2020-09-10: 90 mL via INTRAVENOUS

## 2020-09-10 MED ORDER — NITROGLYCERIN 0.4 MG SL SUBL
SUBLINGUAL_TABLET | SUBLINGUAL | Status: AC
  Filled 2020-09-10: qty 2

## 2020-09-10 MED ORDER — DILTIAZEM HCL 25 MG/5ML IV SOLN
INTRAVENOUS | Status: AC
  Filled 2020-09-10: qty 5

## 2020-09-10 NOTE — Research (Signed)
Subject Name: Cindy Hammond  Subject met inclusion and exclusion criteria.  The informed consent form, study requirements and expectations were reviewed with the subject and questions and concerns were addressed prior to the signing of the consent form.  The subject verbalized understanding of the trial requirements.  The subject agreed to participate in the IDENTIFY trial and signed the informed consent at 12:25 on 09/10/20  The informed consent was obtained prior to performance of any protocol-specific procedures for the subject.  A copy of the signed informed consent was given to the subject and a copy was placed in the subject's medical record.   Timoteo Gaul

## 2020-09-11 ENCOUNTER — Other Ambulatory Visit: Payer: Self-pay | Admitting: Family Medicine

## 2020-09-11 DIAGNOSIS — R072 Precordial pain: Secondary | ICD-10-CM | POA: Diagnosis not present

## 2020-09-14 ENCOUNTER — Other Ambulatory Visit: Payer: Self-pay | Admitting: Physician Assistant

## 2020-09-20 ENCOUNTER — Other Ambulatory Visit: Payer: Self-pay | Admitting: Physician Assistant

## 2020-09-20 DIAGNOSIS — I10 Essential (primary) hypertension: Secondary | ICD-10-CM

## 2020-09-20 DIAGNOSIS — E559 Vitamin D deficiency, unspecified: Secondary | ICD-10-CM

## 2020-09-20 MED ORDER — OMEPRAZOLE 40 MG PO CPDR
40.0000 mg | DELAYED_RELEASE_CAPSULE | Freq: Every day | ORAL | 1 refills | Status: DC
Start: 2020-09-20 — End: 2021-05-29

## 2020-09-20 MED ORDER — FUROSEMIDE 20 MG PO TABS: 20.0000 mg | ORAL_TABLET | Freq: Every day | ORAL | 1 refills | Status: DC

## 2020-09-20 MED ORDER — ATENOLOL 50 MG PO TABS
50.0000 mg | ORAL_TABLET | Freq: Every day | ORAL | 1 refills | Status: DC
Start: 2020-09-20 — End: 2020-11-08

## 2020-09-20 MED ORDER — ATORVASTATIN CALCIUM 40 MG PO TABS: 40.0000 mg | ORAL_TABLET | Freq: Every day | ORAL | 1 refills | Status: DC

## 2020-09-20 MED ORDER — NAPROXEN 500 MG PO TABS: 500.0000 mg | ORAL_TABLET | Freq: Two times a day (BID) | ORAL | 1 refills | Status: DC

## 2020-09-20 MED ORDER — VITAMIN D (ERGOCALCIFEROL) 1.25 MG (50000 UNIT) PO CAPS
ORAL_CAPSULE | ORAL | 1 refills | Status: DC
Start: 2020-09-20 — End: 2021-02-07

## 2020-09-21 DIAGNOSIS — G4730 Sleep apnea, unspecified: Secondary | ICD-10-CM | POA: Insufficient documentation

## 2020-09-21 DIAGNOSIS — M797 Fibromyalgia: Secondary | ICD-10-CM | POA: Insufficient documentation

## 2020-09-21 HISTORY — DX: Fibromyalgia: M79.7

## 2020-09-21 HISTORY — DX: Sleep apnea, unspecified: G47.30

## 2020-09-24 ENCOUNTER — Other Ambulatory Visit: Payer: Self-pay | Admitting: Physician Assistant

## 2020-09-25 ENCOUNTER — Telehealth: Payer: Self-pay

## 2020-09-25 ENCOUNTER — Ambulatory Visit (INDEPENDENT_AMBULATORY_CARE_PROVIDER_SITE_OTHER): Payer: Commercial Managed Care - PPO

## 2020-09-25 ENCOUNTER — Other Ambulatory Visit: Payer: Self-pay

## 2020-09-25 DIAGNOSIS — R0602 Shortness of breath: Secondary | ICD-10-CM | POA: Diagnosis not present

## 2020-09-25 LAB — ECHOCARDIOGRAM COMPLETE
Area-P 1/2: 3.91 cm2
S' Lateral: 2.6 cm

## 2020-09-25 MED ORDER — PERFLUTREN LIPID MICROSPHERE: 1.0000 mL | INTRAVENOUS | Status: AC | PRN

## 2020-09-25 NOTE — Telephone Encounter (Signed)
Left message on patients voicemail to please return our call.   

## 2020-09-25 NOTE — Telephone Encounter (Signed)
-----   Message from Cindy Ripple, DO sent at 09/25/2020  1:49 PM EST ----- The echo showed that the heart is not fully relaxing like it should ( diastolic dysfunction) ,but otherwise normal. I will discuss it at the next office visit.

## 2020-09-26 ENCOUNTER — Telehealth: Payer: Self-pay

## 2020-09-26 MED ORDER — PERFLUTREN LIPID MICROSPHERE: 1.0000 mL | INTRAVENOUS | Status: AC | PRN

## 2020-09-26 NOTE — Telephone Encounter (Signed)
Spoke with patient regarding results and recommendation.  Patient verbalizes understanding and is agreeable to plan of care. Advised patient to call back with any issues or concerns.  

## 2020-09-26 NOTE — Telephone Encounter (Signed)
-----   Message from Kardie Tobb, DO sent at 09/25/2020  1:49 PM EST ----- The echo showed that the heart is not fully relaxing like it should ( diastolic dysfunction) ,but otherwise normal. I will discuss it at the next office visit. 

## 2020-10-01 ENCOUNTER — Other Ambulatory Visit: Payer: Self-pay | Admitting: Family Medicine

## 2020-10-03 ENCOUNTER — Other Ambulatory Visit: Payer: Self-pay | Admitting: Physician Assistant

## 2020-10-03 NOTE — Telephone Encounter (Signed)
Your pt

## 2020-10-04 ENCOUNTER — Other Ambulatory Visit: Payer: Self-pay | Admitting: Physician Assistant

## 2020-10-04 DIAGNOSIS — F419 Anxiety disorder, unspecified: Secondary | ICD-10-CM

## 2020-10-10 LAB — HM COLONOSCOPY

## 2020-10-19 ENCOUNTER — Encounter: Payer: Self-pay | Admitting: Physician Assistant

## 2020-10-22 ENCOUNTER — Other Ambulatory Visit: Payer: Self-pay | Admitting: Physician Assistant

## 2020-10-24 ENCOUNTER — Encounter: Payer: Self-pay | Admitting: Physician Assistant

## 2020-10-26 ENCOUNTER — Encounter: Payer: Self-pay | Admitting: Physician Assistant

## 2020-10-26 ENCOUNTER — Telehealth (INDEPENDENT_AMBULATORY_CARE_PROVIDER_SITE_OTHER): Payer: Commercial Managed Care - PPO | Admitting: Physician Assistant

## 2020-10-26 DIAGNOSIS — M40209 Unspecified kyphosis, site unspecified: Secondary | ICD-10-CM

## 2020-10-26 DIAGNOSIS — K760 Fatty (change of) liver, not elsewhere classified: Secondary | ICD-10-CM | POA: Insufficient documentation

## 2020-10-26 DIAGNOSIS — Q614 Renal dysplasia: Secondary | ICD-10-CM

## 2020-10-26 DIAGNOSIS — R918 Other nonspecific abnormal finding of lung field: Secondary | ICD-10-CM

## 2020-10-26 DIAGNOSIS — J4542 Moderate persistent asthma with status asthmaticus: Secondary | ICD-10-CM | POA: Insufficient documentation

## 2020-10-26 DIAGNOSIS — J029 Acute pharyngitis, unspecified: Secondary | ICD-10-CM

## 2020-10-26 DIAGNOSIS — K7581 Nonalcoholic steatohepatitis (NASH): Secondary | ICD-10-CM | POA: Insufficient documentation

## 2020-10-26 HISTORY — DX: Renal dysplasia: Q61.4

## 2020-10-26 HISTORY — DX: Moderate persistent asthma with status asthmaticus: J45.42

## 2020-10-26 HISTORY — DX: Acute pharyngitis, unspecified: J02.9

## 2020-10-26 HISTORY — DX: Unspecified kyphosis, site unspecified: M40.209

## 2020-10-26 HISTORY — DX: Nonalcoholic steatohepatitis (NASH): K75.81

## 2020-10-26 HISTORY — DX: Other nonspecific abnormal finding of lung field: R91.8

## 2020-10-26 MED ORDER — AMOXICILLIN 875 MG PO TABS: 875.0000 mg | ORAL_TABLET | Freq: Two times a day (BID) | ORAL | 0 refills | Status: DC

## 2020-10-26 NOTE — Progress Notes (Signed)
Virtual Visit via Telephone Note   This visit type was conducted due to national recommendations for restrictions regarding the COVID-19 Pandemic (e.g. social distancing) in an effort to limit this patient's exposure and mitigate transmission in our community.  Due to her co-morbid illnesses, this patient is at least at moderate risk for complications without adequate follow up.  This format is felt to be most appropriate for this patient at this time.  The patient did not have access to video technology/had technical difficulties with video requiring transitioning to audio format only (telephone).  All issues noted in this document were discussed and addressed.  No physical exam could be performed with this format.  Patient verbally consented to a telehealth visit.   Date:  10/26/2020   ID:  Cindy Hammond, DOB [birth year], MRN [id number]  Patient Location: Home Provider Location: Office  PCP:  Marianne Sofia, PA-C     Chief Complaint:  Sore throat  History of Present Illness:    Cindy Hammond is a 55 y.o. female with sore throat - she states she woke up yesterday with sore throat - thinks she sees white spots in back of throat Denies fever, headache, bodyaches cough or congestion Did not want to come by for strep testing  The patient does not have symptoms concerning for COVID-19 infection (fever, chills, cough, or new shortness of breath).    Past Medical History:  Diagnosis Date  . Anxiety   . Depression   . GERD (gastroesophageal reflux disease)   . Hyperlipidemia   . Hypertension   . Pain in joint, lower leg   . Thrombocytopenia (HCC)    Past Surgical History:  Procedure Laterality Date  . TUBAL LIGATION       Current Meds  Medication Sig  . amitriptyline (ELAVIL) 150 MG tablet TAKE 1 TABLET BY MOUTH DAILY AT BEDTIME.  Marland Kitchen amoxicillin (AMOXIL) 875 MG tablet Take 1 tablet (875 mg total) by mouth 2 (two) times daily.  Marland Kitchen atenolol (TENORMIN) 50 MG tablet Take 1 tablet (50 mg  total) by mouth daily.  Marland Kitchen atorvastatin (LIPITOR) 40 MG tablet Take 1 tablet (40 mg total) by mouth daily.  . busPIRone (BUSPAR) 30 MG tablet Take 30 mg by mouth 2 (two) times daily.  . clonazePAM (KLONOPIN) 0.5 MG tablet TAKE 1 TABLET BY MOUTH TWICE DAILY.  . furosemide (LASIX) 20 MG tablet Take 1 tablet (20 mg total) by mouth daily.  . naproxen (NAPROSYN) 500 MG tablet Take 1 tablet (500 mg total) by mouth 2 (two) times daily with a meal.  . nitroGLYCERIN (NITROSTAT) 0.4 MG SL tablet Place 1 tablet (0.4 mg total) under the tongue every 5 (five) minutes as needed for chest pain.  Marland Kitchen omeprazole (PRILOSEC) 40 MG capsule Take 1 capsule (40 mg total) by mouth daily.  . pregabalin (LYRICA) 75 MG capsule TAKE 1 CAPSULE BY MOUTH 3 TIMES DAILY.  Marland Kitchen QUEtiapine (SEROQUEL) 300 MG tablet TAKE 1 TABLET BY MOUTH DAILY AT BEDTIME.  Marland Kitchen topiramate (TOPAMAX) 100 MG tablet TAKE ONE (1) TABLET BY MOUTH 3 TIMES DAILY  . Ubrogepant (UBRELVY) 100 MG TABS Take 100 mg by mouth daily as needed.  . Vitamin D, Ergocalciferol, (DRISDOL) 1.25 MG (50000 UNIT) CAPS capsule 1 po twice weekly     Allergies:   Sumatriptan and Sulfa antibiotics   Social History   Tobacco Use  . Smoking status: Former Smoker    Quit date: 10/03/1990    Years since quitting: 30.0  . Smokeless tobacco:  Never Used  Vaping Use  . Vaping Use: Never used  Substance Use Topics  . Alcohol use: No    Alcohol/week: 0.0 standard drinks  . Drug use: No     Family Hx: The patient's family history includes Hyperlipidemia in her father and mother; Hypertension in her father and mother.  ROS:   Please see the history of present illness.    All other systems reviewed and are negative.  Labs/Other Tests and Data Reviewed:    Recent Labs: 05/03/2020: TSH 2.480 08/14/2020: ALT 43; BUN 10; Creatinine, Ser 0.82; Hemoglobin 14.8; Platelets 297; Potassium 4.0; Sodium 142   Recent Lipid Panel Lab Results  Component Value Date/Time   CHOL 193 08/14/2020  11:01 AM   TRIG 248 (H) 08/14/2020 11:01 AM   HDL 42 08/14/2020 11:01 AM   CHOLHDL 4.6 (H) 08/14/2020 11:01 AM   LDLCALC 108 (H) 08/14/2020 11:01 AM    Wt Readings from Last 3 Encounters:  08/17/20 250 lb (113.4 kg)  08/14/20 249 lb 6.4 oz (113.1 kg)  06/20/20 249 lb (112.9 kg)     Objective:    Vital Signs:  There were no vitals taken for this visit.   VITAL SIGNS:  reviewed  ASSESSMENT & PLAN:    1. Pharyngitis - rx for amoxil - rest, fluids and tylenol  COVID-19 Education: The signs and symptoms of COVID-19 were discussed with the patient and how to seek care for testing (follow up with PCP or arrange E-visit). The importance of social distancing was discussed today.  Time:   Today, I have spent 10 minutes with the patient with telehealth technology discussing the above problems.     Medication Adjustments/Labs and Tests Ordered: Current medicines are reviewed at length with the patient today.  Concerns regarding medicines are outlined above.   Tests Ordered: No orders of the defined types were placed in this encounter.   Medication Changes: Meds ordered this encounter  Medications  . amoxicillin (AMOXIL) 875 MG tablet    Sig: Take 1 tablet (875 mg total) by mouth 2 (two) times daily.    Dispense:  20 tablet    Refill:  0    Order Specific Question:   Supervising Provider    AnswerCorey Harold    Follow Up:  In Person prn  Signed, Jettie Pagan  10/26/2020 8:41 AM    [facility]

## 2020-11-08 ENCOUNTER — Other Ambulatory Visit: Payer: Self-pay | Admitting: Physician Assistant

## 2020-11-15 ENCOUNTER — Encounter: Payer: Self-pay | Admitting: Physician Assistant

## 2020-11-15 ENCOUNTER — Ambulatory Visit (INDEPENDENT_AMBULATORY_CARE_PROVIDER_SITE_OTHER): Payer: Commercial Managed Care - PPO | Admitting: Physician Assistant

## 2020-11-15 ENCOUNTER — Other Ambulatory Visit: Payer: Self-pay

## 2020-11-15 VITALS — BP 118/76 | HR 102 | Temp 97.4°F | Resp 16 | Ht 62.0 in | Wt 246.2 lb

## 2020-11-15 DIAGNOSIS — F419 Anxiety disorder, unspecified: Secondary | ICD-10-CM

## 2020-11-15 DIAGNOSIS — I10 Essential (primary) hypertension: Secondary | ICD-10-CM

## 2020-11-15 DIAGNOSIS — K219 Gastro-esophageal reflux disease without esophagitis: Secondary | ICD-10-CM

## 2020-11-15 DIAGNOSIS — E782 Mixed hyperlipidemia: Secondary | ICD-10-CM

## 2020-11-15 DIAGNOSIS — R7303 Prediabetes: Secondary | ICD-10-CM

## 2020-11-15 DIAGNOSIS — E559 Vitamin D deficiency, unspecified: Secondary | ICD-10-CM | POA: Diagnosis not present

## 2020-11-15 NOTE — Progress Notes (Signed)
Established Patient Office Visit  Subjective:  Patient ID: Cindy Hammond, female    DOB: 1966/06/18  Age: 55 y.o. MRN: [id number]  CC:  Chief Complaint  Patient presents with  . Hypertension  . Anxiety  . Hyperlipidemia    HPI Cindy Hammond presents for follow up hyperlipidemia and chronic issues  Mixed hyperlipidemia  Pt presents with hyperlipidemia.  Compliance with treatment has been good; The patient is compliant with medications, maintains a low cholesterol diet , follows up as directed , and maintains an exercise regimen . The patient denies experiencing any hypercholesterolemia related symptoms.  Evidenced based information based on history , exam, and other sources has been used  for decision making. She is currently taking lipitor 40mg  qd  Pt presents for follow up of hypertension.  The patient is tolerating the medication well without side effects. Compliance with treatment has been good; including taking medication as directed ,    She is currently on atenolol 50mg  qd - she denies chest pain or shortness of breath  Pt with history of GERD - stable on omeprazole 40mg  qd  Pt with history of anxiety - states her symptoms controlled well with clonazepam, amitriptyline,seroquel and buspar - voices no problems or concerns except that she is concerned about her husband who is having some medical testing at this time  Pt currently taking Vit D weekly - due for labwork  Pt has history of migraine headaches - she currently is on topamax and says the has not really helped and not taking it at this time Past Medical History:  Diagnosis Date  . Anxiety   . Depression   . GERD (gastroesophageal reflux disease)   . Hyperlipidemia   . Hypertension   . Pain in joint, lower leg   . Thrombocytopenia (HCC)     Past Surgical History:  Procedure Laterality Date  . TUBAL LIGATION      Family History  Problem Relation Age of Onset  . Hyperlipidemia Mother   . Hypertension  Mother   . Hyperlipidemia Father   . Hypertension Father     Social History   Socioeconomic History  . Marital status: Married    Spouse name: Not on file  . Number of children: 3  . Years of education: Not on file  . Highest education level: Not on file  Occupational History  . Occupation: Unemployed  Tobacco Use  . Smoking status: Current Every Day Smoker    Types: Cigarettes  . Smokeless tobacco: Never Used  Vaping Use  . Vaping Use: Never used  Substance and Sexual Activity  . Alcohol use: No    Alcohol/week: 0.0 standard drinks  . Drug use: No  . Sexual activity: Yes    Partners: Male  Other Topics Concern  . Not on file  Social History Narrative  . Not on file   Social Determinants of Health   Financial Resource Strain: Not on file  Food Insecurity: Not on file  Transportation Needs: Not on file  Physical Activity: Not on file  Stress: Not on file  Social Connections: Not on file  Intimate Partner Violence: Not on file     Current Outpatient Medications:  .  amitriptyline (ELAVIL) 150 MG tablet, TAKE 1 TABLET BY MOUTH DAILY AT BEDTIME., Disp: 30 tablet, Rfl: 2 .  atenolol (TENORMIN) 50 MG tablet, TAKE 1 TABLET BY MOUTH ONCE DAILY, Disp: 90 tablet, Rfl: 1 .  atorvastatin (LIPITOR) 40 MG tablet, Take 1 tablet (40 mg  total) by mouth daily., Disp: 90 tablet, Rfl: 1 .  busPIRone (BUSPAR) 30 MG tablet, Take 30 mg by mouth 2 (two) times daily., Disp: , Rfl:  .  clonazePAM (KLONOPIN) 0.5 MG tablet, TAKE 1 TABLET BY MOUTH TWICE DAILY., Disp: 60 tablet, Rfl: 1 .  furosemide (LASIX) 20 MG tablet, Take 1 tablet (20 mg total) by mouth daily., Disp: 90 tablet, Rfl: 1 .  naproxen (NAPROSYN) 500 MG tablet, Take 1 tablet (500 mg total) by mouth 2 (two) times daily with a meal., Disp: 180 tablet, Rfl: 1 .  nitroGLYCERIN (NITROSTAT) 0.4 MG SL tablet, Place 1 tablet (0.4 mg total) under the tongue every 5 (five) minutes as needed for chest pain., Disp: 25 tablet, Rfl: 3 .   omeprazole (PRILOSEC) 40 MG capsule, Take 1 capsule (40 mg total) by mouth daily., Disp: 90 capsule, Rfl: 1 .  pregabalin (LYRICA) 75 MG capsule, TAKE 1 CAPSULE BY MOUTH 3 TIMES DAILY., Disp: 90 capsule, Rfl: 0 .  QUEtiapine (SEROQUEL) 300 MG tablet, TAKE 1 TABLET BY MOUTH DAILY AT BEDTIME., Disp: 90 tablet, Rfl: 1 .  topiramate (TOPAMAX) 100 MG tablet, TAKE ONE (1) TABLET BY MOUTH 3 TIMES DAILY, Disp: 270 tablet, Rfl: 1 .  Ubrogepant (UBRELVY) 100 MG TABS, Take 100 mg by mouth daily as needed., Disp: 10 tablet, Rfl: 2 .  Vitamin D, Ergocalciferol, (DRISDOL) 1.25 MG (50000 UNIT) CAPS capsule, 1 po twice weekly, Disp: 24 capsule, Rfl: 1   Allergies  Allergen Reactions  . Sumatriptan Hives and Rash  . Sulfa Antibiotics     ROS CONSTITUTIONAL: Negative for chills, fatigue, fever, unintentional weight gain and unintentional weight loss.  E/N/T: Negative for ear pain, nasal congestion and sore throat.  CARDIOVASCULAR: Negative for chest pain, dizziness, palpitations and pedal edema.  RESPIRATORY: Negative for recent cough and dyspnea.  GASTROINTESTINAL: Negative for abdominal pain, acid reflux symptoms, constipation, diarrhea, nausea and vomiting.  MSK: Negative for arthralgias and myalgias.  INTEGUMENTARY: Negative for rash.  NEUROLOGICAL: Negative for dizziness and headaches.  PSYCHIATRIC: Negative for sleep disturbance and to question depression screen.  Negative for depression, negative for anhedonia.            Objective:    PHYSICAL EXAM:   VS: BP 118/76 (BP Location: Left Arm, Patient Position: Sitting, Cuff Size: Normal)   Pulse (!) 102   Temp (!) 97.4 F (36.3 C) (Temporal)   Resp 16   Ht 5\' 2"  (1.575 m)   Wt 246 lb 3.2 oz (111.7 kg)   SpO2 94%   BMI 45.03 kg/m   PHYSICAL EXAM:   VS: BP 118/76 (BP Location: Left Arm, Patient Position: Sitting, Cuff Size: Normal)   Pulse (!) 102   Temp (!) 97.4 F (36.3 C) (Temporal)   Resp 16   Ht 5\' 2"  (1.575 m)   Wt 246 lb  3.2 oz (111.7 kg)   SpO2 94%   BMI 45.03 kg/m   GEN: Well nourished, well developed, in no acute distress  Cardiac: RRR; no murmurs, rubs, or gallops,no edema -  Respiratory:  normal respiratory rate and pattern with no distress - normal breath sounds with no rales, rhonchi, wheezes or rubs Skin: warm and dry, no rash  Neuro:  Alert and Oriented x 3, Strength and sensation are intact - CN II-Xii grossly intact Psych: euthymic mood, appropriate affect and demeanor   BP 118/76 (BP Location: Left Arm, Patient Position: Sitting, Cuff Size: Normal)   Pulse (!) 102   Temp (!)  97.4 F (36.3 C) (Temporal)   Resp 16   Ht 5\' 2"  (1.575 m)   Wt 246 lb 3.2 oz (111.7 kg)   SpO2 94%   BMI 45.03 kg/m  Wt Readings from Last 3 Encounters:  11/15/20 246 lb 3.2 oz (111.7 kg)  08/17/20 250 lb (113.4 kg)  08/14/20 249 lb 6.4 oz (113.1 kg)     There are no preventive care reminders to display for this patient.  There are no preventive care reminders to display for this patient.  Lab Results  Component Value Date   TSH 2.480 05/03/2020   Lab Results  Component Value Date   WBC 6.3 08/14/2020   HGB 14.8 08/14/2020   HCT 43.3 08/14/2020   MCV 90 08/14/2020   PLT 297 08/14/2020   Lab Results  Component Value Date   NA 142 08/14/2020   K 4.0 08/14/2020   CO2 22 08/14/2020   GLUCOSE 113 (H) 08/14/2020   BUN 10 08/14/2020   CREATININE 0.82 08/14/2020   BILITOT 0.4 08/14/2020   ALKPHOS 203 (H) 08/14/2020   AST 28 08/14/2020   ALT 43 (H) 08/14/2020   PROT 7.0 08/14/2020   ALBUMIN 4.0 08/14/2020   CALCIUM 8.7 08/14/2020   Lab Results  Component Value Date   CHOL 193 08/14/2020   Lab Results  Component Value Date   HDL 42 08/14/2020   Lab Results  Component Value Date   LDLCALC 108 (H) 08/14/2020   Lab Results  Component Value Date   TRIG 248 (H) 08/14/2020   Lab Results  Component Value Date   CHOLHDL 4.6 (H) 08/14/2020   Lab Results  Component Value Date   HGBA1C  6.1 (H) 08/14/2020      Assessment & Plan:   Problem List Items Addressed This Visit      Cardiovascular and Mediastinum   Benign hypertension - Primary   Relevant Orders   CBC with Differential/Platelet   Comprehensive metabolic panel   TSH     Digestive   Gastroesophageal reflux disease without esophagitis Continue meds as directed     Other   Anxiety Continue meds as directed   Vitamin D deficiency   Relevant Orders   VITAMIN D 25 Hydroxy (Vit-D Deficiency, Fractures)   Mixed hyperlipidemia   Relevant Orders   Lipid panel         No orders of the defined types were placed in this encounter.   Follow-up: Return in about 3 months (around 02/15/2021) for chronic fasting follow up.    SARA R Sophiah Rolin, PA-C

## 2020-11-16 ENCOUNTER — Ambulatory Visit: Payer: Commercial Managed Care - PPO | Admitting: Cardiology

## 2020-11-16 LAB — CBC WITH DIFFERENTIAL/PLATELET
Basophils Absolute: 0.1 10*3/uL (ref 0.0–0.2)
Basos: 1 %
EOS (ABSOLUTE): 0.3 10*3/uL (ref 0.0–0.4)
Eos: 4 %
Hematocrit: 43.6 % (ref 34.0–46.6)
Hemoglobin: 14.9 g/dL (ref 11.1–15.9)
Immature Grans (Abs): 0 10*3/uL (ref 0.0–0.1)
Immature Granulocytes: 1 %
Lymphocytes Absolute: 1.9 10*3/uL (ref 0.7–3.1)
Lymphs: 23 %
MCH: 30.9 pg (ref 26.6–33.0)
MCHC: 34.2 g/dL (ref 31.5–35.7)
MCV: 91 fL (ref 79–97)
Monocytes Absolute: 0.7 10*3/uL (ref 0.1–0.9)
Monocytes: 9 %
Neutrophils Absolute: 5.1 10*3/uL (ref 1.4–7.0)
Neutrophils: 62 %
Platelets: 334 10*3/uL (ref 150–450)
RBC: 4.82 x10E6/uL (ref 3.77–5.28)
RDW: 13.4 % (ref 11.7–15.4)
WBC: 8 10*3/uL (ref 3.4–10.8)

## 2020-11-16 LAB — COMPREHENSIVE METABOLIC PANEL
ALT: 49 IU/L — ABNORMAL HIGH (ref 0–32)
AST: 37 IU/L (ref 0–40)
Albumin/Globulin Ratio: 1.3 (ref 1.2–2.2)
Albumin: 4 g/dL (ref 3.8–4.9)
Alkaline Phosphatase: 171 IU/L — ABNORMAL HIGH (ref 44–121)
BUN/Creatinine Ratio: 13 (ref 9–23)
BUN: 12 mg/dL (ref 6–24)
Bilirubin Total: 0.2 mg/dL (ref 0.0–1.2)
CO2: 20 mmol/L (ref 20–29)
Calcium: 9.1 mg/dL (ref 8.7–10.2)
Chloride: 107 mmol/L — ABNORMAL HIGH (ref 96–106)
Creatinine, Ser: 0.89 mg/dL (ref 0.57–1.00)
Globulin, Total: 3.1 g/dL (ref 1.5–4.5)
Glucose: 102 mg/dL — ABNORMAL HIGH (ref 65–99)
Potassium: 4 mmol/L (ref 3.5–5.2)
Sodium: 143 mmol/L (ref 134–144)
Total Protein: 7.1 g/dL (ref 6.0–8.5)
eGFR: 77 mL/min/{1.73_m2} (ref >59)

## 2020-11-16 LAB — CARDIOVASCULAR RISK ASSESSMENT

## 2020-11-16 LAB — LIPID PANEL
Chol/HDL Ratio: 3.7 ratio (ref 0.0–4.4)
Cholesterol, Total: 179 mg/dL (ref 100–199)
HDL: 48 mg/dL (ref >39)
LDL Chol Calc (NIH): 94 mg/dL (ref 0–99)
Triglycerides: 220 mg/dL — ABNORMAL HIGH (ref 0–149)
VLDL Cholesterol Cal: 37 mg/dL (ref 5–40)

## 2020-11-16 LAB — VITAMIN D 25 HYDROXY (VIT D DEFICIENCY, FRACTURES): Vit D, 25-Hydroxy: 46.6 ng/mL (ref 30.0–100.0)

## 2020-11-16 LAB — TSH: TSH: 1.5 u[IU]/mL (ref 0.450–4.500)

## 2020-11-23 ENCOUNTER — Other Ambulatory Visit: Payer: Self-pay | Admitting: Physician Assistant

## 2020-11-28 ENCOUNTER — Other Ambulatory Visit: Payer: Self-pay | Admitting: Physician Assistant

## 2020-11-28 MED ORDER — PREGABALIN 75 MG PO CAPS: 75.0000 mg | ORAL_CAPSULE | Freq: Three times a day (TID) | ORAL | 0 refills | Status: DC

## 2020-11-28 MED ORDER — CLONAZEPAM 0.5 MG PO TABS: 0.5000 mg | ORAL_TABLET | Freq: Two times a day (BID) | ORAL | 0 refills | Status: DC

## 2020-12-03 ENCOUNTER — Ambulatory Visit: Payer: Commercial Managed Care - PPO | Admitting: Cardiology

## 2020-12-04 ENCOUNTER — Other Ambulatory Visit: Payer: Self-pay

## 2020-12-04 DIAGNOSIS — F419 Anxiety disorder, unspecified: Secondary | ICD-10-CM

## 2020-12-04 MED ORDER — AMITRIPTYLINE HCL 150 MG PO TABS: 150.0000 mg | ORAL_TABLET | Freq: Every day | ORAL | 2 refills | Status: DC

## 2020-12-05 ENCOUNTER — Other Ambulatory Visit: Payer: Self-pay

## 2020-12-05 DIAGNOSIS — F419 Anxiety disorder, unspecified: Secondary | ICD-10-CM

## 2020-12-05 MED ORDER — AMITRIPTYLINE HCL 150 MG PO TABS: 150.0000 mg | ORAL_TABLET | Freq: Every day | ORAL | 0 refills | Status: DC

## 2020-12-11 ENCOUNTER — Other Ambulatory Visit: Payer: Self-pay | Admitting: Physician Assistant

## 2021-01-18 ENCOUNTER — Telehealth: Payer: Self-pay

## 2021-01-18 DIAGNOSIS — Z006 Encounter for examination for normal comparison and control in clinical research program: Secondary | ICD-10-CM

## 2021-01-18 NOTE — Telephone Encounter (Signed)
I called patient for her 90-day Identify Study follow up phone call. Patient is doing well. Patient stated she is still having chest pains at rest at times. Pt has not followed up with her Cardiologist. I reminded patient to follow up with her Cardiologist for the chest pain. I reminded patient I would call her in January for her 1 year follow-up.

## 2021-02-07 ENCOUNTER — Other Ambulatory Visit: Payer: Self-pay | Admitting: Physician Assistant

## 2021-02-07 DIAGNOSIS — E559 Vitamin D deficiency, unspecified: Secondary | ICD-10-CM

## 2021-02-15 ENCOUNTER — Telehealth: Payer: Self-pay

## 2021-02-15 NOTE — Telephone Encounter (Signed)
Cindy Hammond was approved effective 02/25/2020-03/26/2021.

## 2021-02-18 ENCOUNTER — Ambulatory Visit (INDEPENDENT_AMBULATORY_CARE_PROVIDER_SITE_OTHER): Payer: Commercial Managed Care - PPO | Admitting: Physician Assistant

## 2021-02-18 ENCOUNTER — Other Ambulatory Visit: Payer: Self-pay

## 2021-02-18 ENCOUNTER — Encounter: Payer: Self-pay | Admitting: Physician Assistant

## 2021-02-18 VITALS — BP 120/76 | HR 93 | Temp 97.2°F | Ht 62.0 in | Wt 246.0 lb

## 2021-02-18 DIAGNOSIS — Z23 Encounter for immunization: Secondary | ICD-10-CM

## 2021-02-18 DIAGNOSIS — E782 Mixed hyperlipidemia: Secondary | ICD-10-CM

## 2021-02-18 DIAGNOSIS — F419 Anxiety disorder, unspecified: Secondary | ICD-10-CM

## 2021-02-18 DIAGNOSIS — E559 Vitamin D deficiency, unspecified: Secondary | ICD-10-CM | POA: Diagnosis not present

## 2021-02-18 DIAGNOSIS — R7303 Prediabetes: Secondary | ICD-10-CM | POA: Diagnosis not present

## 2021-02-18 DIAGNOSIS — K219 Gastro-esophageal reflux disease without esophagitis: Secondary | ICD-10-CM | POA: Diagnosis not present

## 2021-02-18 DIAGNOSIS — I10 Essential (primary) hypertension: Secondary | ICD-10-CM | POA: Diagnosis not present

## 2021-02-18 HISTORY — DX: Encounter for immunization: Z23

## 2021-02-18 MED ORDER — BUSPIRONE HCL 30 MG PO TABS
30.0000 mg | ORAL_TABLET | Freq: Two times a day (BID) | ORAL | 1 refills | Status: DC
Start: 2021-02-18 — End: 2021-03-06

## 2021-02-18 NOTE — Progress Notes (Signed)
Established Patient Office Visit  Subjective:  Patient ID: Cindy Hammond, female    DOB: 1966-03-30  Age: 55 y.o. MRN: [id number]  CC:  Chief Complaint  Patient presents with   Hypertension    HPI Cindy Hammond presents for follow up hyperlipidemia and chronic issues  Mixed hyperlipidemia  Pt presents with hyperlipidemia.  Compliance with treatment has been good; The patient is compliant with medications, maintains a low cholesterol diet , follows up as directed , and maintains an exercise regimen . The patient denies experiencing any hypercholesterolemia related symptoms.  Evidenced based information based on history , exam, and other sources has been used  for decision making. She is currently taking lipitor 21m qd  Pt presents for follow up of hypertension.  The patient is tolerating the medication well without side effects. Compliance with treatment has been good; including taking medication as directed ,    She is currently on atenolol [id number] - she denies chest pain or shortness of breath  Pt with history of GERD - stable on omeprazole 406md  Pt with history of anxiety - states her symptoms controlled well with clonazepam, amitriptyline,seroquel and buspar - voices no problems or concerns except that she is concerned about her husband who is having some health problems  Pt currently taking Vit D weekly - due for labwork  Past Medical History:  Diagnosis Date   Anxiety    Depression    GERD (gastroesophageal reflux disease)    Hyperlipidemia    Hypertension    Pain in joint, lower leg    Thrombocytopenia (HCC)     Past Surgical History:  Procedure Laterality Date   TUBAL LIGATION      Family History  Problem Relation Age of Onset   Hyperlipidemia Mother    Hypertension Mother    Hyperlipidemia Father    Hypertension Father     Social History   Socioeconomic History   Marital status: Married    Spouse name: Not on file   Number of children: 3   Years of  education: Not on file   Highest education level: Not on file  Occupational History   Occupation: Unemployed  Tobacco Use   Smoking status: Every Day    Pack years: 0.00    Types: Cigarettes   Smokeless tobacco: Never  Vaping Use   Vaping Use: Never used  Substance and Sexual Activity   Alcohol use: No    Alcohol/week: 0.0 standard drinks   Drug use: No   Sexual activity: Yes    Partners: Male  Other Topics Concern   Not on file  Social History Narrative   Not on file   Social Determinants of Health   Financial Resource Strain: Not on file  Food Insecurity: Not on file  Transportation Needs: Not on file  Physical Activity: Not on file  Stress: Not on file  Social Connections: Not on file  Intimate Partner Violence: Not on file     Current Outpatient Medications:    amitriptyline (ELAVIL) 150 MG tablet, Take 1 tablet (150 mg total) by mouth at bedtime., Disp: 90 tablet, Rfl: 0   atenolol (TENORMIN) 50 MG tablet, TAKE 1 TABLET BY MOUTH ONCE DAILY, Disp: 90 tablet, Rfl: 1   atorvastatin (LIPITOR) 40 MG tablet, Take 1 tablet (40 mg total) by mouth daily., Disp: 90 tablet, Rfl: 1   clonazePAM (KLONOPIN) 0.5 MG tablet, TAKE 1 TABLET BY MOUTH TWICE DAILY., Disp: 60 tablet, Rfl: 0   furosemide (  LASIX) 20 MG tablet, Take 1 tablet (20 mg total) by mouth daily., Disp: 90 tablet, Rfl: 1   naproxen (NAPROSYN) 500 MG tablet, Take 1 tablet (500 mg total) by mouth 2 (two) times daily with a meal., Disp: 180 tablet, Rfl: 1   omeprazole (PRILOSEC) 40 MG capsule, Take 1 capsule (40 mg total) by mouth daily., Disp: 90 capsule, Rfl: 1   pregabalin (LYRICA) 75 MG capsule, Take 1 capsule (75 mg total) by mouth 3 (three) times daily., Disp: 270 capsule, Rfl: 0   QUEtiapine (SEROQUEL) 300 MG tablet, TAKE 1 TABLET BY MOUTH DAILY AT BEDTIME., Disp: 90 tablet, Rfl: 1   topiramate (TOPAMAX) 100 MG tablet, TAKE ONE (1) TABLET BY MOUTH 3 TIMES DAILY, Disp: 270 tablet, Rfl: 1   Vitamin D,  Ergocalciferol, (DRISDOL) 1.25 MG (50000 UNIT) CAPS capsule, TAKE 1 CAPSULE TWICE A WEEK, Disp: 24 capsule, Rfl: 3   busPIRone (BUSPAR) 30 MG tablet, Take 1 tablet (30 mg total) by mouth 2 (two) times daily., Disp: 180 tablet, Rfl: 1   nitroGLYCERIN (NITROSTAT) 0.4 MG SL tablet, Place 1 tablet (0.4 mg total) under the tongue every 5 (five) minutes as needed for chest pain., Disp: 25 tablet, Rfl: 3   Allergies  Allergen Reactions   Sumatriptan Hives and Rash   Sulfa Antibiotics     ROS CONSTITUTIONAL: Negative for chills, fatigue, fever, unintentional weight gain and unintentional weight loss.  E/N/T: Negative for ear pain, nasal congestion and sore throat.  CARDIOVASCULAR: Negative for chest pain, dizziness, palpitations and pedal edema.  RESPIRATORY: Negative for recent cough and dyspnea.  GASTROINTESTINAL: Negative for abdominal pain, acid reflux symptoms, constipation, diarrhea, nausea and vomiting.  MSK: Negative for arthralgias and myalgias.  INTEGUMENTARY: Negative for rash.  NEUROLOGICAL: Negative for dizziness and headaches.  PSYCHIATRIC: Negative for sleep disturbance and to question depression screen.  Negative for depression, negative for anhedonia.         Objective:    PHYSICAL EXAM:   VS: BP 120/76 (BP Location: Right Arm, Patient Position: Sitting, Cuff Size: Large)   Pulse 93   Temp (!) 97.2 F (36.2 C) (Temporal)   Ht 5' 2"  (1.575 m)   Wt 246 lb (111.6 kg)   SpO2 93%   BMI 44.99 kg/m   PHYSICAL EXAM:   VS: BP 120/76 (BP Location: Right Arm, Patient Position: Sitting, Cuff Size: Large)   Pulse 93   Temp (!) 97.2 F (36.2 C) (Temporal)   Ht 5' 2"  (1.575 m)   Wt 246 lb (111.6 kg)   SpO2 93%   BMI 44.99 kg/m   PHYSICAL EXAM:   VS: BP 120/76 (BP Location: Right Arm, Patient Position: Sitting, Cuff Size: Large)   Pulse 93   Temp (!) 97.2 F (36.2 C) (Temporal)   Ht 5' 2"  (1.575 m)   Wt 246 lb (111.6 kg)   SpO2 93%   BMI 44.99 kg/m   GEN: Well  nourished, well developed, in no acute distress  Cardiac: RRR; no murmurs, rubs, or gallops,no edema -  Respiratory:  normal respiratory rate and pattern with no distress - normal breath sounds with no rales, rhonchi, wheezes or rubs MS: no deformity or atrophy  Skin: warm and dry, no rash  Neuro:  Alert and Oriented x 3, Strength and sensation are intact - CN II-Xii grossly intact Psych: euthymic mood, appropriate affect and demeanor   BP 120/76 (BP Location: Right Arm, Patient Position: Sitting, Cuff Size: Large)   Pulse 93  Temp (!) 97.2 F (36.2 C) (Temporal)   Ht 5' 2"  (1.575 m)   Wt 246 lb (111.6 kg)   SpO2 93%   BMI 44.99 kg/m  Wt Readings from Last 3 Encounters:  02/18/21 246 lb (111.6 kg)  11/15/20 246 lb 3.2 oz (111.7 kg)  08/17/20 250 lb (113.4 kg)     Health Maintenance Due  Topic Date Due   Pneumococcal Vaccine 59-48 Years old (1 - PCV) Never done   PAP SMEAR-Modifier  Never done   Zoster Vaccines- Shingrix (1 of 2) Never done    There are no preventive care reminders to display for this patient.  Lab Results  Component Value Date   TSH 1.500 11/15/2020   Lab Results  Component Value Date   WBC 8.0 11/15/2020   HGB 14.9 11/15/2020   HCT 43.6 11/15/2020   MCV 91 11/15/2020   PLT 334 11/15/2020   Lab Results  Component Value Date   NA 143 11/15/2020   K 4.0 11/15/2020   CO2 20 11/15/2020   GLUCOSE 102 (H) 11/15/2020   BUN 12 11/15/2020   CREATININE 0.89 11/15/2020   BILITOT 0.2 11/15/2020   ALKPHOS 171 (H) 11/15/2020   AST 37 11/15/2020   ALT 49 (H) 11/15/2020   PROT 7.1 11/15/2020   ALBUMIN 4.0 11/15/2020   CALCIUM 9.1 11/15/2020   EGFR 77 11/15/2020   Lab Results  Component Value Date   CHOL 179 11/15/2020   Lab Results  Component Value Date   HDL 48 11/15/2020   Lab Results  Component Value Date   LDLCALC 94 11/15/2020   Lab Results  Component Value Date   TRIG 220 (H) 11/15/2020   Lab Results  Component Value Date    CHOLHDL 3.7 11/15/2020   Lab Results  Component Value Date   HGBA1C 6.1 (H) 08/14/2020      Assessment & Plan:   Problem List Items Addressed This Visit       Cardiovascular and Mediastinum   Benign hypertension - Primary   Relevant Orders   CBC with Differential/Platelet   Comprehensive metabolic panel   TSH Continue meds     Digestive   Gastroesophageal reflux disease without esophagitis Continue meds as directed     Other   Anxiety Continue meds as directed   Vitamin D deficiency   Relevant Orders   VITAMIN D 25 Hydroxy (Vit-D Deficiency, Fractures)   Mixed hyperlipidemia   Relevant Orders   Lipid panel         Pneumo 23 given Meds ordered this encounter  Medications   busPIRone (BUSPAR) 30 MG tablet    Sig: Take 1 tablet (30 mg total) by mouth 2 (two) times daily.    Dispense:  180 tablet    Refill:  1    Order Specific Question:   Supervising Provider    AnswerShelton Silvas    Follow-up: Return in about 3 months (around 05/21/2021) for chronic fasting follow up.    SARA R Jarrel Knoke, PA-C

## 2021-02-19 ENCOUNTER — Encounter: Payer: Self-pay | Admitting: Physician Assistant

## 2021-02-19 LAB — CBC WITH DIFFERENTIAL/PLATELET
Basophils Absolute: 0.1 10*3/uL (ref 0.0–0.2)
Basos: 1 %
EOS (ABSOLUTE): 0.4 10*3/uL (ref 0.0–0.4)
Eos: 6 %
Hematocrit: 43.3 % (ref 34.0–46.6)
Hemoglobin: 15.1 g/dL (ref 11.1–15.9)
Immature Grans (Abs): 0 10*3/uL (ref 0.0–0.1)
Immature Granulocytes: 0 %
Lymphocytes Absolute: 2 10*3/uL (ref 0.7–3.1)
Lymphs: 25 %
MCH: 32.1 pg (ref 26.6–33.0)
MCHC: 34.9 g/dL (ref 31.5–35.7)
MCV: 92 fL (ref 79–97)
Monocytes Absolute: 0.6 10*3/uL (ref 0.1–0.9)
Monocytes: 8 %
Neutrophils Absolute: 4.8 10*3/uL (ref 1.4–7.0)
Neutrophils: 60 %
Platelets: 316 10*3/uL (ref 150–450)
RBC: 4.7 x10E6/uL (ref 3.77–5.28)
RDW: 13.2 % (ref 11.7–15.4)
WBC: 7.9 10*3/uL (ref 3.4–10.8)

## 2021-02-19 LAB — COMPREHENSIVE METABOLIC PANEL
ALT: 43 IU/L — ABNORMAL HIGH (ref 0–32)
AST: 25 IU/L (ref 0–40)
Albumin/Globulin Ratio: 1.4 (ref 1.2–2.2)
Albumin: 4.1 g/dL (ref 3.8–4.9)
Alkaline Phosphatase: 185 IU/L — ABNORMAL HIGH (ref 44–121)
BUN/Creatinine Ratio: 11 (ref 9–23)
BUN: 10 mg/dL (ref 6–24)
Bilirubin Total: 0.3 mg/dL (ref 0.0–1.2)
CO2: 21 mmol/L (ref 20–29)
Calcium: 9.1 mg/dL (ref 8.7–10.2)
Chloride: 107 mmol/L — ABNORMAL HIGH (ref 96–106)
Creatinine, Ser: 0.89 mg/dL (ref 0.57–1.00)
Globulin, Total: 3 g/dL (ref 1.5–4.5)
Glucose: 106 mg/dL — ABNORMAL HIGH (ref 65–99)
Potassium: 3.9 mmol/L (ref 3.5–5.2)
Sodium: 140 mmol/L (ref 134–144)
Total Protein: 7.1 g/dL (ref 6.0–8.5)
eGFR: 77 mL/min/{1.73_m2} (ref >59)

## 2021-02-19 LAB — VITAMIN D 25 HYDROXY (VIT D DEFICIENCY, FRACTURES): Vit D, 25-Hydroxy: 47.9 ng/mL (ref 30.0–100.0)

## 2021-02-19 LAB — LIPID PANEL
Chol/HDL Ratio: 4.5 ratio — ABNORMAL HIGH (ref 0.0–4.4)
Cholesterol, Total: 202 mg/dL — ABNORMAL HIGH (ref 100–199)
HDL: 45 mg/dL (ref >39)
LDL Chol Calc (NIH): 109 mg/dL — ABNORMAL HIGH (ref 0–99)
Triglycerides: 279 mg/dL — ABNORMAL HIGH (ref 0–149)
VLDL Cholesterol Cal: 48 mg/dL — ABNORMAL HIGH (ref 5–40)

## 2021-02-19 LAB — HEMOGLOBIN A1C
Est. average glucose Bld gHb Est-mCnc: 128 mg/dL
Hgb A1c MFr Bld: 6.1 % — ABNORMAL HIGH (ref 4.8–5.6)

## 2021-02-19 LAB — TSH: TSH: 3.39 u[IU]/mL (ref 0.450–4.500)

## 2021-02-19 LAB — CARDIOVASCULAR RISK ASSESSMENT

## 2021-02-19 NOTE — Telephone Encounter (Signed)
If she is needing note to take care of husband then would recommend she get note from his specialist otherwise I do not know why she could not work

## 2021-02-19 NOTE — Telephone Encounter (Signed)
Please correct in chart -- not sure where she sees that She was a pt of Andi's and I don't recall her smoking history So please call and ask -- it could have been put in here or with another provider

## 2021-02-20 ENCOUNTER — Encounter: Payer: Self-pay | Admitting: Physician Assistant

## 2021-02-20 NOTE — Telephone Encounter (Signed)
written

## 2021-02-22 ENCOUNTER — Other Ambulatory Visit: Payer: Self-pay | Admitting: Physician Assistant

## 2021-02-25 ENCOUNTER — Other Ambulatory Visit: Payer: Self-pay | Admitting: Physician Assistant

## 2021-02-25 ENCOUNTER — Other Ambulatory Visit: Payer: Self-pay

## 2021-03-06 ENCOUNTER — Other Ambulatory Visit: Payer: Self-pay

## 2021-03-06 DIAGNOSIS — F419 Anxiety disorder, unspecified: Secondary | ICD-10-CM

## 2021-03-06 MED ORDER — BUSPIRONE HCL 30 MG PO TABS: 30.0000 mg | ORAL_TABLET | Freq: Two times a day (BID) | ORAL | 1 refills | Status: DC

## 2021-03-08 ENCOUNTER — Telehealth (INDEPENDENT_AMBULATORY_CARE_PROVIDER_SITE_OTHER): Payer: Commercial Managed Care - PPO | Admitting: Physician Assistant

## 2021-03-08 ENCOUNTER — Encounter: Payer: Self-pay | Admitting: Physician Assistant

## 2021-03-08 VITALS — Ht 62.0 in | Wt 246.0 lb

## 2021-03-08 DIAGNOSIS — J029 Acute pharyngitis, unspecified: Secondary | ICD-10-CM

## 2021-03-08 DIAGNOSIS — R0981 Nasal congestion: Secondary | ICD-10-CM | POA: Diagnosis not present

## 2021-03-08 NOTE — Progress Notes (Signed)
Virtual Visit via Telephone Note   This visit type was conducted due to national recommendations for restrictions regarding the COVID-19 Pandemic (e.g. social distancing) in an effort to limit this patient's exposure and mitigate transmission in our community.  Due to her co-morbid illnesses, this patient is at least at moderate risk for complications without adequate follow up.  This format is felt to be most appropriate for this patient at this time.  The patient did not have access to video technology/had technical difficulties with video requiring transitioning to audio format only (telephone).  All issues noted in this document were discussed and addressed.  No physical exam could be performed with this format.  Patient verbally consented to a telehealth visit.   Date:  03/08/2021   ID:  Humberto Leep, DOB [birth year], MRN [id number]  Patient Location: Home Provider Location: Office  PCP:  Marianne Sofia, PA-C    Chief Complaint:  malaise  History of Present Illness:    Cindy Hammond is a 55 y.o. female with malaise - states that yesterday she felt bad and had diarrhea - husband with sore throat and hers has been scratchy - today she is feeling much better She took a home COVID test and thought was positive but actually read wrong and it was negative  The patient does not have symptoms concerning for COVID-19 infection (fever, chills, cough, or new shortness of breath).    Past Medical History:  Diagnosis Date   Anxiety    Depression    GERD (gastroesophageal reflux disease)    Hyperlipidemia    Hypertension    Pain in joint, lower leg    Thrombocytopenia (HCC)    Past Surgical History:  Procedure Laterality Date   TUBAL LIGATION       Current Meds  Medication Sig   amitriptyline (ELAVIL) 150 MG tablet Take 1 tablet (150 mg total) by mouth at bedtime.   atenolol (TENORMIN) 50 MG tablet TAKE 1 TABLET BY MOUTH ONCE DAILY   atorvastatin (LIPITOR) 40 MG tablet Take 1 tablet (40  mg total) by mouth daily.   busPIRone (BUSPAR) 30 MG tablet Take 1 tablet (30 mg total) by mouth 2 (two) times daily.   clonazePAM (KLONOPIN) 0.5 MG tablet TAKE 1 TABLET TWICE A DAY   furosemide (LASIX) 20 MG tablet Take 1 tablet (20 mg total) by mouth daily.   naproxen (NAPROSYN) 500 MG tablet Take 1 tablet (500 mg total) by mouth 2 (two) times daily with a meal.   omeprazole (PRILOSEC) 40 MG capsule Take 1 capsule (40 mg total) by mouth daily.   pregabalin (LYRICA) 75 MG capsule TAKE 1 CAPSULE THREE TIMES A DAY   QUEtiapine (SEROQUEL) 300 MG tablet TAKE 1 TABLET BY MOUTH DAILY AT BEDTIME.   topiramate (TOPAMAX) 100 MG tablet TAKE ONE (1) TABLET BY MOUTH 3 TIMES DAILY   Vitamin D, Ergocalciferol, (DRISDOL) 1.25 MG (50000 UNIT) CAPS capsule TAKE 1 CAPSULE TWICE A WEEK     Allergies:   Sumatriptan and Sulfa antibiotics   Social History   Tobacco Use   Smoking status: Former    Packs/day: 0.50    Years: 10.00    Pack years: 5.00    Types: Cigarettes    Quit date: 2000    Years since quitting: 22.5   Smokeless tobacco: Never  Vaping Use   Vaping Use: Never used  Substance Use Topics   Alcohol use: No    Alcohol/week: 0.0 standard drinks   Drug use: No  Family Hx: The patient's family history includes Hyperlipidemia in her father and mother; Hypertension in her father and mother.  ROS:   Please see the history of present illness.    All other systems reviewed and are negative.  Labs/Other Tests and Data Reviewed:    Recent Labs: 02/18/2021: ALT 43; BUN 10; Creatinine, Ser 0.89; Hemoglobin 15.1; Platelets 316; Potassium 3.9; Sodium 140; TSH 3.390   Recent Lipid Panel Lab Results  Component Value Date/Time   CHOL 202 (H) 02/18/2021 08:46 AM   TRIG 279 (H) 02/18/2021 08:46 AM   HDL 45 02/18/2021 08:46 AM   CHOLHDL 4.5 (H) 02/18/2021 08:46 AM   LDLCALC 109 (H) 02/18/2021 08:46 AM    Wt Readings from Last 3 Encounters:  03/08/21 246 lb (111.6 kg)  02/18/21 246 lb  (111.6 kg)  11/15/20 246 lb 3.2 oz (111.7 kg)     Objective:    Vital Signs:  Ht 5\' 2"  (1.575 m)   Wt 246 lb (111.6 kg)   BMI 44.99 kg/m    Vital signs reviewed General - appears in no acute distress  ASSESSMENT & PLAN:    Viral syndrome --- recommend clear liquids and bland diet - follow up if any symptoms change or worsen  COVID-19 Education: The signs and symptoms of COVID-19 were discussed with the patient and how to seek care for testing (follow up with PCP or arrange E-visit). The importance of social distancing was discussed today.  Time:   Today, I have spent 10 minutes with the patient with telehealth technology discussing the above problems.     Medication Adjustments/Labs and Tests Ordered: Current medicines are reviewed at length with the patient today.  Concerns regarding medicines are outlined above.   Tests Ordered: Orders Placed This Encounter  Procedures   POC COVID-19   POCT rapid strep A    Medication Changes: No orders of the defined types were placed in this encounter.   Follow Up:  In Person prn  Signed, , PA-C  03/08/2021 11:02 AM    [facility] [place]

## 2021-03-26 ENCOUNTER — Encounter: Payer: Self-pay | Admitting: Physician Assistant

## 2021-04-02 ENCOUNTER — Telehealth (INDEPENDENT_AMBULATORY_CARE_PROVIDER_SITE_OTHER): Payer: Managed Care, Other (non HMO) | Admitting: Nurse Practitioner

## 2021-04-02 ENCOUNTER — Encounter: Payer: Self-pay | Admitting: Nurse Practitioner

## 2021-04-02 DIAGNOSIS — J029 Acute pharyngitis, unspecified: Secondary | ICD-10-CM

## 2021-04-02 LAB — POC COVID19 BINAXNOW: SARS Coronavirus 2 Ag: NEGATIVE

## 2021-04-02 LAB — POCT RAPID STREP A (OFFICE): Rapid Strep A Screen: NEGATIVE

## 2021-04-02 NOTE — Progress Notes (Signed)
Virtual Visit via Video Note   This visit type was conducted due to national recommendations for restrictions regarding the COVID-19 Pandemic (e.g. social distancing) in an effort to limit this patient's exposure and mitigate transmission in our community.  Due to her co-morbid illnesses, this patient is at least at moderate risk for complications without adequate follow up.  This format is felt to be most appropriate for this patient at this time.  All issues noted in this document were discussed and addressed.  A limited physical exam was performed with this format.  A verbal consent was obtained for the virtual visit.   Date:  04/02/2021   ID:  Cindy Hammond, DOB 1965-10-05, MRN [id number]  Patient Location: Home Provider Location: Office/Clinic  PCP:  Marianne Sofia, PA-C   Evaluation Performed:  Established patient, acute telemedicine visit  Chief Complaint:  Sore throat  History of Present Illness:    Cindy Hammond is a 55 y.o. female with sore throat. Onset was one day ago. Treatment includes gargling with Listerine.She has a past history  of asthma and sleep apnea. Denies cough, dyspnea, or wheezing. Denies exposure to known ill-contacts.  The patient does have symptoms concerning for COVID-19 infection (fever, chills, cough, or new shortness of breath).    Past Medical History:  Diagnosis Date   Anxiety    Depression    GERD (gastroesophageal reflux disease)    Hyperlipidemia    Hypertension    Pain in joint, lower leg    Thrombocytopenia (HCC)     Past Surgical History:  Procedure Laterality Date   TUBAL LIGATION      Family History  Problem Relation Age of Onset   Hyperlipidemia Mother    Hypertension Mother    Hyperlipidemia Father    Hypertension Father     Social History   Socioeconomic History   Marital status: Married    Spouse name: Not on file   Number of children: 3   Years of education: Not on file   Highest education level: Not on file   Occupational History   Occupation: Unemployed  Tobacco Use   Smoking status: Former    Packs/day: 0.50    Years: 10.00    Pack years: 5.00    Types: Cigarettes    Quit date: 2000    Years since quitting: 22.6   Smokeless tobacco: Never  Vaping Use   Vaping Use: Never used  Substance and Sexual Activity   Alcohol use: No    Alcohol/week: 0.0 standard drinks   Drug use: No   Sexual activity: Yes    Partners: Male  Other Topics Concern   Not on file  Social History Narrative   Not on file   Social Determinants of Health   Financial Resource Strain: Not on file  Food Insecurity: Not on file  Transportation Needs: Not on file  Physical Activity: Not on file  Stress: Not on file  Social Connections: Not on file  Intimate Partner Violence: Not on file    Outpatient Medications Prior to Visit  Medication Sig Dispense Refill   amitriptyline (ELAVIL) 150 MG tablet Take 1 tablet (150 mg total) by mouth at bedtime. 90 tablet 0   atenolol (TENORMIN) 50 MG tablet TAKE 1 TABLET BY MOUTH ONCE DAILY 90 tablet 1   atorvastatin (LIPITOR) 40 MG tablet Take 1 tablet (40 mg total) by mouth daily. 90 tablet 1   busPIRone (BUSPAR) 30 MG tablet Take 1 tablet (30 mg total) by mouth  2 (two) times daily. 180 tablet 1   clonazePAM (KLONOPIN) 0.5 MG tablet TAKE 1 TABLET TWICE A DAY 180 tablet 0   furosemide (LASIX) 20 MG tablet Take 1 tablet (20 mg total) by mouth daily. 90 tablet 1   naproxen (NAPROSYN) 500 MG tablet Take 1 tablet (500 mg total) by mouth 2 (two) times daily with a meal. 180 tablet 1   nitroGLYCERIN (NITROSTAT) 0.4 MG SL tablet Place 1 tablet (0.4 mg total) under the tongue every 5 (five) minutes as needed for chest pain. 25 tablet 3   omeprazole (PRILOSEC) 40 MG capsule Take 1 capsule (40 mg total) by mouth daily. 90 capsule 1   pregabalin (LYRICA) 75 MG capsule TAKE 1 CAPSULE THREE TIMES A DAY 270 capsule 0   QUEtiapine (SEROQUEL) 300 MG tablet TAKE 1 TABLET BY MOUTH DAILY AT  BEDTIME. 90 tablet 1   topiramate (TOPAMAX) 100 MG tablet TAKE ONE (1) TABLET BY MOUTH 3 TIMES DAILY 270 tablet 1   Vitamin D, Ergocalciferol, (DRISDOL) 1.25 MG (50000 UNIT) CAPS capsule TAKE 1 CAPSULE TWICE A WEEK 24 capsule 3   No facility-administered medications prior to visit.    Allergies:   Sumatriptan and Sulfa antibiotics   Social History   Tobacco Use   Smoking status: Former    Packs/day: 0.50    Years: 10.00    Pack years: 5.00    Types: Cigarettes    Quit date: 2000    Years since quitting: 22.6   Smokeless tobacco: Never  Vaping Use   Vaping Use: Never used  Substance Use Topics   Alcohol use: No    Alcohol/week: 0.0 standard drinks   Drug use: No     Review of Systems  Constitutional:  Negative for chills, fever and malaise/fatigue.  HENT:  Positive for sore throat. Negative for congestion, ear pain and sinus pain.   Eyes: Negative.   Respiratory:  Negative for cough, shortness of breath and wheezing.   Cardiovascular:  Negative for chest pain.  Gastrointestinal: Negative.   Genitourinary: Negative.   Musculoskeletal: Negative.   Skin:  Negative for rash.  Neurological:  Negative for headaches.  Endo/Heme/Allergies:  Positive for environmental allergies.  Psychiatric/Behavioral: Negative.      Labs/Other Tests and Data Reviewed:    Recent Labs: 02/18/2021: ALT 43; BUN 10; Creatinine, Ser 0.89; Hemoglobin 15.1; Platelets 316; Potassium 3.9; Sodium 140; TSH 3.390   Recent Lipid Panel Lab Results  Component Value Date/Time   CHOL 202 (H) 02/18/2021 08:46 AM   TRIG 279 (H) 02/18/2021 08:46 AM   HDL 45 02/18/2021 08:46 AM   CHOLHDL 4.5 (H) 02/18/2021 08:46 AM   LDLCALC 109 (H) 02/18/2021 08:46 AM    Wt Readings from Last 3 Encounters:  03/08/21 246 lb (111.6 kg)  02/18/21 246 lb (111.6 kg)  11/15/20 246 lb 3.2 oz (111.7 kg)     Objective:    Vital Signs:  There were no vitals taken for this visit.    Physical Exam No physical exam due to  telemedicine visit  ASSESSMENT & PLAN:   1. Sore throat - POC COVID-19-negative - POCT rapid strep A-negative    Orders Placed This Encounter  Procedures   POC COVID-19   POCT rapid strep A    Warm salt water gargles and throat lozenges as needed Rest and push fluids Follow-up if symptoms worsen or fail to improve   COVID-19 Education: The signs and symptoms of COVID-19 were discussed with the patient and  how to seek care for testing (follow up with PCP or arrange E-visit). The importance of social distancing was discussed today.   I spent 7 minutes dedicated to the care of this patient on the date of this encounter to include face-to-face time with the patient, as well as: EMR  Follow Up:  In Person prn  Signed, Janie Morning, NP  04/02/2021 8:37 AM    [facility] [place]

## 2021-04-19 ENCOUNTER — Other Ambulatory Visit: Payer: Self-pay

## 2021-04-19 DIAGNOSIS — F419 Anxiety disorder, unspecified: Secondary | ICD-10-CM

## 2021-04-21 MED ORDER — AMITRIPTYLINE HCL 150 MG PO TABS: 150.0000 mg | ORAL_TABLET | Freq: Every day | ORAL | 0 refills | Status: DC

## 2021-04-22 ENCOUNTER — Other Ambulatory Visit: Payer: Self-pay | Admitting: Physician Assistant

## 2021-04-22 DIAGNOSIS — I10 Essential (primary) hypertension: Secondary | ICD-10-CM

## 2021-04-30 ENCOUNTER — Ambulatory Visit (INDEPENDENT_AMBULATORY_CARE_PROVIDER_SITE_OTHER): Payer: Managed Care, Other (non HMO) | Admitting: Physician Assistant

## 2021-04-30 ENCOUNTER — Encounter: Payer: Self-pay | Admitting: Physician Assistant

## 2021-04-30 VITALS — BP 108/64 | HR 100 | Temp 97.0°F | Ht 62.0 in | Wt 246.0 lb

## 2021-04-30 DIAGNOSIS — J020 Streptococcal pharyngitis: Secondary | ICD-10-CM | POA: Diagnosis not present

## 2021-04-30 LAB — POC COVID19 BINAXNOW: SARS Coronavirus 2 Ag: NEGATIVE

## 2021-04-30 LAB — POCT RAPID STREP A (OFFICE): Rapid Strep A Screen: POSITIVE — AB

## 2021-04-30 MED ORDER — AMOXICILLIN 875 MG PO TABS: 875.0000 mg | ORAL_TABLET | Freq: Two times a day (BID) | ORAL | 0 refills | Status: AC

## 2021-04-30 NOTE — Progress Notes (Signed)
Acute Office Visit  Subjective:    Patient ID: Cindy Hammond, female    DOB: 1966-03-20, 55 y.o.   MRN: [id number]  Chief Complaint  Patient presents with   Sore Throat    Worse in the morning after waking up, no cough, no congestion/PND. Cold drinks sooth her throat some, sometimes when swallowing food her throat feels raw.    HPI Patient is in today for complaints of sore throat - states she has had intermittent problems with scratchy throat, slight cough, and congestion off and on for the past month but symptoms worsened last week Has not been exposed to strep or COVID that she is aware of Denies fever, ha, rash   Past Medical History:  Diagnosis Date   Anxiety    Depression    GERD (gastroesophageal reflux disease)    Hyperlipidemia    Hypertension    Pain in joint, lower leg    Thrombocytopenia (HCC)     Past Surgical History:  Procedure Laterality Date   TUBAL LIGATION      Family History  Problem Relation Age of Onset   Hyperlipidemia Mother    Hypertension Mother    Hyperlipidemia Father    Hypertension Father     Social History   Socioeconomic History   Marital status: Married    Spouse name: Not on file   Number of children: 3   Years of education: Not on file   Highest education level: Not on file  Occupational History   Occupation: Unemployed  Tobacco Use   Smoking status: Former    Packs/day: 0.50    Years: 10.00    Pack years: 5.00    Types: Cigarettes    Quit date: 2000    Years since quitting: 22.6   Smokeless tobacco: Never  Vaping Use   Vaping Use: Never used  Substance and Sexual Activity   Alcohol use: No    Alcohol/week: 0.0 standard drinks   Drug use: No   Sexual activity: Yes    Partners: Male  Other Topics Concern   Not on file  Social History Narrative   Not on file   Social Determinants of Health   Financial Resource Strain: Not on file  Food Insecurity: Not on file  Transportation Needs: Not on file  Physical  Activity: Not on file  Stress: Not on file  Social Connections: Not on file  Intimate Partner Violence: Not on file    Outpatient Medications Prior to Visit  Medication Sig Dispense Refill   amitriptyline (ELAVIL) 150 MG tablet Take 1 tablet (150 mg total) by mouth at bedtime. 90 tablet 0   atenolol (TENORMIN) 50 MG tablet TAKE 1 TABLET BY MOUTH ONCE DAILY 90 tablet 1   atorvastatin (LIPITOR) 40 MG tablet Take 1 tablet (40 mg total) by mouth daily. 90 tablet 1   busPIRone (BUSPAR) 30 MG tablet Take 1 tablet (30 mg total) by mouth 2 (two) times daily. 180 tablet 1   clonazePAM (KLONOPIN) 0.5 MG tablet TAKE 1 TABLET TWICE A DAY 180 tablet 0   furosemide (LASIX) 20 MG tablet Take 1 tablet (20 mg total) by mouth daily. 90 tablet 1   naproxen (NAPROSYN) 500 MG tablet Take 1 tablet (500 mg total) by mouth 2 (two) times daily with a meal. 180 tablet 1   nitroGLYCERIN (NITROSTAT) 0.4 MG SL tablet Place 1 tablet (0.4 mg total) under the tongue every 5 (five) minutes as needed for chest pain. 25 tablet 3  omeprazole (PRILOSEC) 40 MG capsule Take 1 capsule (40 mg total) by mouth daily. 90 capsule 1   pregabalin (LYRICA) 75 MG capsule TAKE 1 CAPSULE THREE TIMES A DAY 270 capsule 0   QUEtiapine (SEROQUEL) 300 MG tablet TAKE 1 TABLET BY MOUTH DAILY AT BEDTIME. 90 tablet 1   topiramate (TOPAMAX) 100 MG tablet TAKE ONE (1) TABLET BY MOUTH 3 TIMES DAILY 270 tablet 1   Vitamin D, Ergocalciferol, (DRISDOL) 1.25 MG (50000 UNIT) CAPS capsule TAKE 1 CAPSULE TWICE A WEEK 24 capsule 3   No facility-administered medications prior to visit.    Allergies  Allergen Reactions   Sumatriptan Hives and Rash   Sulfa Antibiotics     Review of Systems CONSTITUTIONAL: Negative for chills, fatigue, fever, unintentional weight gain and unintentional weight loss.  E/N/T: see HPI CARDIOVASCULAR: Negative for chest pain, dizziness, palpitations and pedal edema.  RESPIRATORY: Negative for recent cough and dyspnea.   GASTROINTESTINAL: Negative for abdominal pain, acid reflux symptoms, constipation, diarrhea, nausea and vomiting.          Objective:    Physical Exam PHYSICAL EXAM:   VS: BP 108/64   Pulse 100   Temp (!) 97 F (36.1 C)   Ht 5' 2"  (1.575 m)   Wt 246 lb (111.6 kg)   SpO2 96%   BMI 44.99 kg/m   GEN: Well nourished, well developed, in no acute distress  HEENT: normal external ears and nose - normal external auditory canals and TMS -  - Lips, Teeth and Gums - normal  Oropharynx - moderate erythema noted Cardiac: RRR; no murmurs, rubs, or gallops,no edema -  Respiratory:  normal respiratory rate and pattern with no distress - normal breath sounds with no rales, rhonchi, wheezes or rubs Skin: warm and dry, no rash   Office Visit on 04/30/2021  Component Date Value Ref Range Status   SARS Coronavirus 2 Ag 04/30/2021 Negative  Negative Final   Rapid Strep A Screen 04/30/2021 Positive (A) Negative Final    BP 108/64   Pulse 100   Temp (!) 97 F (36.1 C)   Ht 5' 2"  (1.575 m)   Wt 246 lb (111.6 kg)   SpO2 96%   BMI 44.99 kg/m  Wt Readings from Last 3 Encounters:  04/30/21 246 lb (111.6 kg)  03/08/21 246 lb (111.6 kg)  02/18/21 246 lb (111.6 kg)    Health Maintenance Due  Topic Date Due   COVID-19 Vaccine (1) Never done   PAP SMEAR-Modifier  Never done   INFLUENZA VACCINE  04/01/2021    There are no preventive care reminders to display for this patient.   Lab Results  Component Value Date   TSH 3.390 02/18/2021   Lab Results  Component Value Date   WBC 7.9 02/18/2021   HGB 15.1 02/18/2021   HCT 43.3 02/18/2021   MCV 92 02/18/2021   PLT 316 02/18/2021   Lab Results  Component Value Date   NA 140 02/18/2021   K 3.9 02/18/2021   CO2 21 02/18/2021   GLUCOSE 106 (H) 02/18/2021   BUN 10 02/18/2021   CREATININE 0.89 02/18/2021   BILITOT 0.3 02/18/2021   ALKPHOS 185 (H) 02/18/2021   AST 25 02/18/2021   ALT 43 (H) 02/18/2021   PROT 7.1 02/18/2021    ALBUMIN 4.1 02/18/2021   CALCIUM 9.1 02/18/2021   EGFR 77 02/18/2021   Lab Results  Component Value Date   CHOL 202 (H) 02/18/2021   Lab Results  Component  Value Date   HDL 45 02/18/2021   Lab Results  Component Value Date   LDLCALC 109 (H) 02/18/2021   Lab Results  Component Value Date   TRIG 279 (H) 02/18/2021   Lab Results  Component Value Date   CHOLHDL 4.5 (H) 02/18/2021   Lab Results  Component Value Date   HGBA1C 6.1 (H) 02/18/2021       Assessment & Plan:  1. Strep pharyngitis - POC COVID-19 BinaxNow - POCT rapid strep A - amoxicillin (AMOXIL) 875 MG tablet; Take 1 tablet (875 mg total) by mouth 2 (two) times daily for 10 days.  Dispense: 20 tablet; Refill: 0    Meds ordered this encounter  Medications   amoxicillin (AMOXIL) 875 MG tablet    Sig: Take 1 tablet (875 mg total) by mouth 2 (two) times daily for 10 days.    Dispense:  20 tablet    Refill:  0    Order Specific Question:   Supervising Provider    AnswerShelton Silvas    Orders Placed This Encounter  Procedures   POC COVID-19 BinaxNow   POCT rapid strep A     Follow-up: Return if symptoms worsen or fail to improve.  An After Visit Summary was printed and given to the patient.  Yetta Flock [facility] [phone]

## 2021-05-14 ENCOUNTER — Other Ambulatory Visit: Payer: Self-pay

## 2021-05-14 NOTE — Telephone Encounter (Signed)
Wants to begin getting medication at [facility].   Lorita Officer, [place] 05/14/21 4:16 PM

## 2021-05-15 NOTE — Telephone Encounter (Signed)
When pt called 9/13 it was told to CMA medication was needing to be sent to new pharmacy and she would no longer be going to mail order pharmacy.   Called [facility]. Her chart with them is termed and last clonazepam was delivered 7/2.   Called pt. Pt states she has weeks worth of medication left and was just calling to state she would not be going back to mail order. Made pt aware we could not send medications until they are needed to new pharmacy. Pt VU and will call back when couple days of supply is left.   Pharmacy updated in chart, only [facility] listed.   Lorita Officer, CCMA 05/15/21 8:59 AM

## 2021-05-22 ENCOUNTER — Encounter: Payer: Self-pay | Admitting: Physician Assistant

## 2021-05-22 ENCOUNTER — Ambulatory Visit (INDEPENDENT_AMBULATORY_CARE_PROVIDER_SITE_OTHER): Payer: Managed Care, Other (non HMO)

## 2021-05-22 DIAGNOSIS — Z23 Encounter for immunization: Secondary | ICD-10-CM

## 2021-05-29 ENCOUNTER — Ambulatory Visit (INDEPENDENT_AMBULATORY_CARE_PROVIDER_SITE_OTHER): Payer: Managed Care, Other (non HMO) | Admitting: Physician Assistant

## 2021-05-29 ENCOUNTER — Ambulatory Visit: Payer: Commercial Managed Care - PPO | Admitting: Physician Assistant

## 2021-05-29 ENCOUNTER — Encounter: Payer: Self-pay | Admitting: Physician Assistant

## 2021-05-29 ENCOUNTER — Other Ambulatory Visit: Payer: Self-pay

## 2021-05-29 VITALS — BP 110/72 | HR 91 | Temp 97.8°F | Ht 62.0 in | Wt 246.0 lb

## 2021-05-29 DIAGNOSIS — R519 Headache, unspecified: Secondary | ICD-10-CM | POA: Diagnosis not present

## 2021-05-29 DIAGNOSIS — I10 Essential (primary) hypertension: Secondary | ICD-10-CM

## 2021-05-29 DIAGNOSIS — M797 Fibromyalgia: Secondary | ICD-10-CM | POA: Diagnosis not present

## 2021-05-29 DIAGNOSIS — K219 Gastro-esophageal reflux disease without esophagitis: Secondary | ICD-10-CM | POA: Diagnosis not present

## 2021-05-29 DIAGNOSIS — E782 Mixed hyperlipidemia: Secondary | ICD-10-CM | POA: Diagnosis not present

## 2021-05-29 DIAGNOSIS — G8929 Other chronic pain: Secondary | ICD-10-CM

## 2021-05-29 DIAGNOSIS — R69 Illness, unspecified: Secondary | ICD-10-CM | POA: Diagnosis not present

## 2021-05-29 DIAGNOSIS — E559 Vitamin D deficiency, unspecified: Secondary | ICD-10-CM

## 2021-05-29 DIAGNOSIS — F419 Anxiety disorder, unspecified: Secondary | ICD-10-CM

## 2021-05-29 MED ORDER — FUROSEMIDE 20 MG PO TABS: 20.0000 mg | ORAL_TABLET | Freq: Every day | ORAL | 1 refills | Status: DC

## 2021-05-29 MED ORDER — CLONAZEPAM 0.5 MG PO TABS: 0.5000 mg | ORAL_TABLET | Freq: Two times a day (BID) | ORAL | 1 refills | Status: DC

## 2021-05-29 MED ORDER — OMEPRAZOLE 40 MG PO CPDR: 40.0000 mg | DELAYED_RELEASE_CAPSULE | Freq: Every day | ORAL | 1 refills | Status: DC

## 2021-05-29 NOTE — Progress Notes (Signed)
Established Patient Office Visit  Subjective:  Patient ID: Cindy Hammond, female    DOB: 05/04/1966  Age: 55 y.o. MRN: [id number]  CC:  Chief Complaint  Patient presents with   Hyperlipidemia    HPI Cindy Hammond presents for follow up hyperlipidemia and chronic issues  Mixed hyperlipidemia  Pt presents with hyperlipidemia.  Compliance with treatment has been good; The patient is compliant with medications, maintains a low cholesterol diet , follows up as directed , and maintains an exercise regimen . The patient denies experiencing any hypercholesterolemia related symptoms.  Evidenced based information based on history , exam, and other sources has been used  for decision making. She is currently taking lipitor 15m qd  Pt presents for follow up of hypertension.  The patient is tolerating the medication well without side effects. Compliance with treatment has been good; including taking medication as directed ,    She is currently on atenolol 557mqd and lasix 2066md - she denies chest pain or shortness of breath  Pt with history of GERD - stable on omeprazole 32m72m  Pt with history of anxiety - states her symptoms controlled well with clonazepam, amitriptyline,seroquel and buspar - voices no problems or concerns   Pt currently taking Vit D weekly - due for labwork  Pt with history of chronic headaches - currently on topamax 100mg72mtates she has headaches for yeats and seems to be getting worse - she would like to be referred to neurologist - she would like to see Amy KTeodora Mediciher husband also sees  Past Medical History:  Diagnosis Date   Anxiety    Depression    GERD (gastroesophageal reflux disease)    Hyperlipidemia    Hypertension    Pain in joint, lower leg    Thrombocytopenia (HCC)     Past Surgical History:  Procedure Laterality Date   TUBAL LIGATION      Family History  Problem Relation Age of Onset   Hyperlipidemia Mother    Hypertension Mother     Hyperlipidemia Father    Hypertension Father     Social History   Socioeconomic History   Marital status: Married    Spouse name: Not on file   Number of children: 3   Years of education: Not on file   Highest education level: Not on file  Occupational History   Occupation: Unemployed  Tobacco Use   Smoking status: Former    Packs/day: 0.50    Years: 10.00    Pack years: 5.00    Types: Cigarettes    Quit date: 2000    Years since quitting: 22.7   Smokeless tobacco: Never  Vaping Use   Vaping Use: Never used  Substance and Sexual Activity   Alcohol use: No    Alcohol/week: 0.0 standard drinks   Drug use: No   Sexual activity: Yes    Partners: Male  Other Topics Concern   Not on file  Social History Narrative   Not on file   Social Determinants of Health   Financial Resource Strain: Not on file  Food Insecurity: Not on file  Transportation Needs: Not on file  Physical Activity: Not on file  Stress: Not on file  Social Connections: Not on file  Intimate Partner Violence: Not on file     Current Outpatient Medications:    amitriptyline (ELAVIL) 150 MG tablet, Take 1 tablet (150 mg total) by mouth at bedtime., Disp: 90 tablet, Rfl: 0  atenolol (TENORMIN) 50 MG tablet, TAKE 1 TABLET BY MOUTH ONCE DAILY, Disp: 90 tablet, Rfl: 1   atorvastatin (LIPITOR) 40 MG tablet, Take 1 tablet (40 mg total) by mouth daily., Disp: 90 tablet, Rfl: 1   busPIRone (BUSPAR) 30 MG tablet, Take 1 tablet (30 mg total) by mouth 2 (two) times daily., Disp: 180 tablet, Rfl: 1   naproxen (NAPROSYN) 500 MG tablet, Take 1 tablet (500 mg total) by mouth 2 (two) times daily with a meal., Disp: 180 tablet, Rfl: 1   pregabalin (LYRICA) 75 MG capsule, TAKE 1 CAPSULE THREE TIMES A DAY, Disp: 270 capsule, Rfl: 0   QUEtiapine (SEROQUEL) 300 MG tablet, TAKE 1 TABLET BY MOUTH DAILY AT BEDTIME., Disp: 90 tablet, Rfl: 1   topiramate (TOPAMAX) 100 MG tablet, TAKE ONE (1) TABLET BY MOUTH 3 TIMES DAILY, Disp:  270 tablet, Rfl: 1   Vitamin D, Ergocalciferol, (DRISDOL) 1.25 MG (50000 UNIT) CAPS capsule, TAKE 1 CAPSULE TWICE A WEEK, Disp: 24 capsule, Rfl: 3   clonazePAM (KLONOPIN) 0.5 MG tablet, Take 1 tablet (0.5 mg total) by mouth 2 (two) times daily., Disp: 60 tablet, Rfl: 1   furosemide (LASIX) 20 MG tablet, Take 1 tablet (20 mg total) by mouth daily., Disp: 90 tablet, Rfl: 1   nitroGLYCERIN (NITROSTAT) 0.4 MG SL tablet, Place 1 tablet (0.4 mg total) under the tongue every 5 (five) minutes as needed for chest pain., Disp: 25 tablet, Rfl: 3   omeprazole (PRILOSEC) 40 MG capsule, Take 1 capsule (40 mg total) by mouth daily., Disp: 90 capsule, Rfl: 1   Allergies  Allergen Reactions   Sumatriptan Hives and Rash   Sulfa Antibiotics    CONSTITUTIONAL: Negative for chills, fatigue, fever, unintentional weight gain and unintentional weight loss.  E/N/T: Negative for ear pain, nasal congestion and sore throat.  CARDIOVASCULAR: Negative for chest pain, dizziness, palpitations and pedal edema.  RESPIRATORY: Negative for recent cough and dyspnea.  GASTROINTESTINAL: Negative for abdominal pain, acid reflux symptoms, constipation, diarrhea, nausea and vomiting.  MSK: Negative for arthralgias and myalgias.  INTEGUMENTARY: Negative for rash.  NEUROLOGICAL: see HPI PSYCHIATRIC: Negative for sleep disturbance and to question depression screen.  Negative for depression, negative for anhedonia.        Objective:   PHYSICAL EXAM:   VS: BP 110/72 (BP Location: Left Arm, Patient Position: Sitting)   Pulse 91   Temp 97.8 F (36.6 C) (Temporal)   Ht _0  (1.575 m)   Wt 246 lb (111.6 kg)   SpO2 99%   BMI 44.99 kg/m   GEN: Well nourished, well developed, in no acute distress  Cardiac: RRR; no murmurs, rubs, or gallops,no edema - Respiratory:  normal respiratory rate and pattern with no distress - normal breath sounds with no rales, rhonchi, wheezes or rubs GI: normal bowel sounds, no masses or  tenderness MS: no deformity or atrophy  Skin: warm and dry, no rash  Neuro:  Alert and Oriented x 3, Strength and sensation are intact - CN II-Xii grossly intact Psych: euthymic mood, appropriate affect and demeanor   Health Maintenance Due  Topic Date Due   PAP SMEAR-Modifier  Never done    There are no preventive care reminders to display for this patient.  Lab Results  Component Value Date   TSH 3.390 02/18/2021   Lab Results  Component Value Date   WBC 7.9 02/18/2021   HGB 15.1 02/18/2021   HCT 43.3 02/18/2021   MCV 92 02/18/2021   PLT 316  02/18/2021   Lab Results  Component Value Date   NA 140 02/18/2021   K 3.9 02/18/2021   CO2 21 02/18/2021   GLUCOSE 106 (H) 02/18/2021   BUN 10 02/18/2021   CREATININE 0.89 02/18/2021   BILITOT 0.3 02/18/2021   ALKPHOS 185 (H) 02/18/2021   AST 25 02/18/2021   ALT 43 (H) 02/18/2021   PROT 7.1 02/18/2021   ALBUMIN 4.1 02/18/2021   CALCIUM 9.1 02/18/2021   EGFR 77 02/18/2021   Lab Results  Component Value Date   CHOL 202 (H) 02/18/2021   Lab Results  Component Value Date   HDL 45 02/18/2021   Lab Results  Component Value Date   LDLCALC 109 (H) 02/18/2021   Lab Results  Component Value Date   TRIG 279 (H) 02/18/2021   Lab Results  Component Value Date   CHOLHDL 4.5 (H) 02/18/2021   Lab Results  Component Value Date   HGBA1C 6.1 (H) 02/18/2021      Assessment & Plan:   Problem List Items Addressed This Visit       Cardiovascular and Mediastinum   Benign hypertension - Primary   Relevant Orders   CBC with Differential/Platelet   Comprehensive metabolic panel   TSH Continue meds     Digestive   Gastroesophageal reflux disease without esophagitis Continue meds as directed     Other   Anxiety Continue meds as directed    Vitamin D deficiency   Relevant Orders   VITAMIN D 25 Hydroxy (Vit-D Deficiency, Fractures)   Mixed hyperlipidemia   Relevant Orders   Lipid panel   Chronic headaches Refer  to neuro      Meds ordered this encounter  Medications   clonazePAM (KLONOPIN) 0.5 MG tablet    Sig: Take 1 tablet (0.5 mg total) by mouth 2 (two) times daily.    Dispense:  60 tablet    Refill:  1    Order Specific Question:   Supervising Provider    Answer:   Shelton Silvas   furosemide (LASIX) 20 MG tablet    Sig: Take 1 tablet (20 mg total) by mouth daily.    Dispense:  90 tablet    Refill:  1    Order Specific Question:   Supervising Provider    Answer:   Shelton Silvas   omeprazole (PRILOSEC) 40 MG capsule    Sig: Take 1 capsule (40 mg total) by mouth daily.    Dispense:  90 capsule    Refill:  1    Order Specific Question:   Supervising Provider    Answer:   Shelton Silvas    Follow-up: Return in about 3 months (around 08/28/2021) for chronic fasting.    SARA R Roselynn Whitacre, PA-C

## 2021-05-30 LAB — LIPID PANEL
Chol/HDL Ratio: 5.5 ratio — ABNORMAL HIGH (ref 0.0–4.4)
Cholesterol, Total: 209 mg/dL — ABNORMAL HIGH (ref 100–199)
HDL: 38 mg/dL — ABNORMAL LOW (ref >39)
LDL Chol Calc (NIH): 118 mg/dL — ABNORMAL HIGH (ref 0–99)
Triglycerides: 304 mg/dL — ABNORMAL HIGH (ref 0–149)
VLDL Cholesterol Cal: 53 mg/dL — ABNORMAL HIGH (ref 5–40)

## 2021-05-30 LAB — CBC WITH DIFFERENTIAL/PLATELET
Basophils Absolute: 0.1 10*3/uL (ref 0.0–0.2)
Basos: 1 %
EOS (ABSOLUTE): 0.4 10*3/uL (ref 0.0–0.4)
Eos: 4 %
Hematocrit: 43 % (ref 34.0–46.6)
Hemoglobin: 14.5 g/dL (ref 11.1–15.9)
Immature Grans (Abs): 0.1 10*3/uL (ref 0.0–0.1)
Immature Granulocytes: 1 %
Lymphocytes Absolute: 2.1 10*3/uL (ref 0.7–3.1)
Lymphs: 25 %
MCH: 31.3 pg (ref 26.6–33.0)
MCHC: 33.7 g/dL (ref 31.5–35.7)
MCV: 93 fL (ref 79–97)
Monocytes Absolute: 0.7 10*3/uL (ref 0.1–0.9)
Monocytes: 8 %
Neutrophils Absolute: 5.2 10*3/uL (ref 1.4–7.0)
Neutrophils: 61 %
Platelets: 296 10*3/uL (ref 150–450)
RBC: 4.64 x10E6/uL (ref 3.77–5.28)
RDW: 12.6 % (ref 11.7–15.4)
WBC: 8.5 10*3/uL (ref 3.4–10.8)

## 2021-05-30 LAB — COMPREHENSIVE METABOLIC PANEL
ALT: 33 IU/L — ABNORMAL HIGH (ref 0–32)
AST: 23 IU/L (ref 0–40)
Albumin/Globulin Ratio: 1.4 (ref 1.2–2.2)
Albumin: 4 g/dL (ref 3.8–4.9)
Alkaline Phosphatase: 178 IU/L — ABNORMAL HIGH (ref 44–121)
BUN/Creatinine Ratio: 20 (ref 9–23)
BUN: 17 mg/dL (ref 6–24)
Bilirubin Total: 0.2 mg/dL (ref 0.0–1.2)
CO2: 19 mmol/L — ABNORMAL LOW (ref 20–29)
Calcium: 9.2 mg/dL (ref 8.7–10.2)
Chloride: 108 mmol/L — ABNORMAL HIGH (ref 96–106)
Creatinine, Ser: 0.86 mg/dL (ref 0.57–1.00)
Globulin, Total: 2.8 g/dL (ref 1.5–4.5)
Glucose: 112 mg/dL — ABNORMAL HIGH (ref 70–99)
Potassium: 4 mmol/L (ref 3.5–5.2)
Sodium: 141 mmol/L (ref 134–144)
Total Protein: 6.8 g/dL (ref 6.0–8.5)
eGFR: 80 mL/min/{1.73_m2} (ref >59)

## 2021-05-30 LAB — VITAMIN D 25 HYDROXY (VIT D DEFICIENCY, FRACTURES): Vit D, 25-Hydroxy: 45.6 ng/mL (ref 30.0–100.0)

## 2021-05-30 LAB — CARDIOVASCULAR RISK ASSESSMENT

## 2021-05-30 LAB — TSH: TSH: 2.59 u[IU]/mL (ref 0.450–4.500)

## 2021-06-05 ENCOUNTER — Other Ambulatory Visit: Payer: Self-pay | Admitting: Physician Assistant

## 2021-06-05 ENCOUNTER — Ambulatory Visit (INDEPENDENT_AMBULATORY_CARE_PROVIDER_SITE_OTHER): Payer: 59 | Admitting: Cardiology

## 2021-06-05 ENCOUNTER — Encounter: Payer: Self-pay | Admitting: Cardiology

## 2021-06-05 ENCOUNTER — Telehealth: Payer: Self-pay

## 2021-06-05 ENCOUNTER — Other Ambulatory Visit: Payer: Self-pay

## 2021-06-05 VITALS — BP 102/72 | HR 85 | Ht 62.0 in | Wt 245.0 lb

## 2021-06-05 DIAGNOSIS — M79605 Pain in left leg: Secondary | ICD-10-CM

## 2021-06-05 DIAGNOSIS — E782 Mixed hyperlipidemia: Secondary | ICD-10-CM

## 2021-06-05 DIAGNOSIS — M79604 Pain in right leg: Secondary | ICD-10-CM

## 2021-06-05 DIAGNOSIS — I2510 Atherosclerotic heart disease of native coronary artery without angina pectoris: Secondary | ICD-10-CM

## 2021-06-05 DIAGNOSIS — I10 Essential (primary) hypertension: Secondary | ICD-10-CM | POA: Diagnosis not present

## 2021-06-05 LAB — BASIC METABOLIC PANEL
BUN/Creatinine Ratio: 14 (ref 9–23)
BUN: 12 mg/dL (ref 6–24)
CO2: 24 mmol/L (ref 20–29)
Calcium: 9.2 mg/dL (ref 8.7–10.2)
Chloride: 103 mmol/L (ref 96–106)
Creatinine, Ser: 0.85 mg/dL (ref 0.57–1.00)
Glucose: 97 mg/dL (ref 70–99)
Potassium: 4.1 mmol/L (ref 3.5–5.2)
Sodium: 141 mmol/L (ref 134–144)
eGFR: 81 mL/min/{1.73_m2} (ref >59)

## 2021-06-05 LAB — MAGNESIUM: Magnesium: 2 mg/dL (ref 1.6–2.3)

## 2021-06-05 MED ORDER — ATENOLOL 25 MG PO TABS: 25.0000 mg | ORAL_TABLET | Freq: Two times a day (BID) | ORAL | 3 refills | Status: DC

## 2021-06-05 MED ORDER — FUROSEMIDE 40 MG PO TABS: 40.0000 mg | ORAL_TABLET | Freq: Every day | ORAL | 3 refills | Status: DC

## 2021-06-05 NOTE — Telephone Encounter (Signed)
Patient called stated she hurt her back, fell out of the closet on to her back.  Patient had not taking anything for her back pain, Patient was advised to take naproxen and rest as much as she can, if she is not feeling any better by Monday to call and make appointment to see provider.  If symptoms get worst over weekend to go to the nearest ER or Urgent Care.  Provider made aware of above information.  Gwynneth Aliment, CMA [phone] 06/05/2021

## 2021-06-05 NOTE — Patient Instructions (Addendum)
Medication Instructions:  Your physician has recommended you make the following change in your medication:  DECREASE: Atenolol 25 mg once daily INCREASE: Lasix 40 mg once daily *If you need a refill on your cardiac medications before your next appointment, please call your pharmacy*   Lab Work: Your physician recommends that you return for lab work in:  TODAY: BMET, Mag If you have labs (blood work) drawn today and your tests are completely normal, you will receive your results only by: MyChart Message (if you have MyChart) OR A paper copy in the mail If you have any lab test that is abnormal or we need to change your treatment, we will call you to review the results.   Testing/Procedures: Your physician has requested that you have an ankle brachial index (ABI). During this test an ultrasound and blood pressure cuff are used to evaluate the arteries that supply the arms and legs with blood. Allow thirty minutes for this exam. There are no restrictions or special instructions.    Follow-Up: At [facility], you and your health needs are our priority.  As part of our continuing mission to provide you with exceptional heart care, we have created designated Provider Care Teams.  These Care Teams include your primary Cardiologist (physician) and Advanced Practice Providers (APPs -  Physician Assistants and Nurse Practitioners) who all work together to provide you with the care you need, when you need it.  We recommend signing up for the patient portal called "MyChart".  Sign up information is provided on this After Visit Summary.  MyChart is used to connect with patients for Virtual Visits (Telemedicine).  Patients are able to view lab/test results, encounter notes, upcoming appointments, etc.  Non-urgent messages can be sent to your provider as well.   To learn more about what you can do with MyChart, go to [email or web address].    Your next appointment:   12 week(s)  The format for  your next appointment:   In Person  Provider:   Thomasene Ripple, DO [address], [place], [place] [address]    Other Instructions

## 2021-06-05 NOTE — Progress Notes (Signed)
Cardiology Office Note:    Date:  06/05/2021   ID:  Jhoanna Heyde, DOB September 08, 1965, MRN [id number]  PCP:  Marianne Sofia, PA-C  Cardiologist:  Thomasene Ripple, DO  Electrophysiologist:  None   Referring MD: Marianne Sofia, PA-C   " I have been having shortness of breath"   History of Present Illness:    Cindy Hammond is a 55 y.o. female with a hx of nonobstructive minimal coronary artery disease seen on coronary CTA done in January 2022, hypertension, hyperlipidemia, chronic diastolic heart failure is here today for follow-up visit.  I saw the patient for the 2021 at that time she experiencing some shortness of and the patient for echocardiogram test will coronary CTA.  In the interim she was able to get these testings done.  Since I saw the patient she says she has had 3 episodes of chest discomfort.  She describes it as a midsternal chest pressure-like sensation.  One of the episodes she had to take nitroglycerin with no results.  But the other 2 which was most recent she had not taking nitroglycerin and it resolved on its own.  Her most pressing issue today is the fact that she is getting worsening shortness of breath.  She reports that she is unable to lie flat she has shortness of breath during that time.  On exertion.  She also reported she has been having some leg pain when she walks.  She tells me that it is happening constantly.  No other complaints at this time.   Past Medical History:  Diagnosis Date   Anxiety    Depression    GERD (gastroesophageal reflux disease)    Hyperlipidemia    Hypertension    Pain in joint, lower leg    Thrombocytopenia (HCC)     Past Surgical History:  Procedure Laterality Date   TUBAL LIGATION      Current Medications: Current Meds  Medication Sig   amitriptyline (ELAVIL) 150 MG tablet Take 1 tablet (150 mg total) by mouth at bedtime.   atenolol (TENORMIN) 25 MG tablet Take 1 tablet (25 mg total) by mouth 2 (two) times daily.   busPIRone (BUSPAR)  30 MG tablet Take 1 tablet (30 mg total) by mouth 2 (two) times daily.   clonazePAM (KLONOPIN) 0.5 MG tablet Take 1 tablet (0.5 mg total) by mouth 2 (two) times daily.   furosemide (LASIX) 40 MG tablet Take 1 tablet (40 mg total) by mouth daily.   naproxen (NAPROSYN) 500 MG tablet Take 1 tablet (500 mg total) by mouth 2 (two) times daily with a meal.   omeprazole (PRILOSEC) 40 MG capsule Take 1 capsule (40 mg total) by mouth daily.   pregabalin (LYRICA) 75 MG capsule TAKE 1 CAPSULE THREE TIMES A DAY   QUEtiapine (SEROQUEL) 300 MG tablet TAKE 1 TABLET BY MOUTH DAILY AT BEDTIME.   topiramate (TOPAMAX) 100 MG tablet TAKE ONE (1) TABLET BY MOUTH 3 TIMES DAILY   Vitamin D, Ergocalciferol, (DRISDOL) 1.25 MG (50000 UNIT) CAPS capsule TAKE 1 CAPSULE TWICE A WEEK   [DISCONTINUED] atenolol (TENORMIN) 50 MG tablet TAKE 1 TABLET BY MOUTH ONCE DAILY   [DISCONTINUED] atorvastatin (LIPITOR) 40 MG tablet Take 1 tablet (40 mg total) by mouth daily.   [DISCONTINUED] furosemide (LASIX) 20 MG tablet Take 1 tablet (20 mg total) by mouth daily.     Allergies:   Sumatriptan and Sulfa antibiotics   Social History   Socioeconomic History   Marital status: Married    Spouse  name: Not on file   Number of children: 3   Years of education: Not on file   Highest education level: Not on file  Occupational History   Occupation: Unemployed  Tobacco Use   Smoking status: Former    Packs/day: 0.50    Years: 10.00    Pack years: 5.00    Types: Cigarettes    Quit date: 2000    Years since quitting: 22.7   Smokeless tobacco: Never  Vaping Use   Vaping Use: Never used  Substance and Sexual Activity   Alcohol use: No    Alcohol/week: 0.0 standard drinks   Drug use: No   Sexual activity: Yes    Partners: Male  Other Topics Concern   Not on file  Social History Narrative   Not on file   Social Determinants of Health   Financial Resource Strain: Not on file  Food Insecurity: Not on file  Transportation  Needs: Not on file  Physical Activity: Not on file  Stress: Not on file  Social Connections: Not on file     Family History: The patient's family history includes Hyperlipidemia in her father and mother; Hypertension in her father and mother.  ROS:   Review of Systems  Constitution: Negative for decreased appetite, fever and weight gain.  HENT: Negative for congestion, ear discharge, hoarse voice and sore throat.   Eyes: Negative for discharge, redness, vision loss in right eye and visual halos.  Cardiovascular: N reports chest pain, dyspnea on exertion.  Negative for leg swelling, orthopnea and palpitations.  Respiratory: Negative for cough, hemoptysis, shortness of breath and snoring.   Endocrine: Negative for heat intolerance and polyphagia.  Hematologic/Lymphatic: Negative for bleeding problem. Does not bruise/bleed easily.  Skin: Negative for flushing, nail changes, rash and suspicious lesions.  Musculoskeletal: Negative for arthritis, joint pain, muscle cramps, myalgias, neck pain and stiffness.  Gastrointestinal: Negative for abdominal pain, bowel incontinence, diarrhea and excessive appetite.  Genitourinary: Negative for decreased libido, genital sores and incomplete emptying.  Neurological: Negative for brief paralysis, focal weakness, headaches and loss of balance.  Psychiatric/Behavioral: Negative for altered mental status, depression and suicidal ideas.  Allergic/Immunologic: Negative for HIV exposure and persistent infections.    EKGs/Labs/Other Studies Reviewed:    The following studies were reviewed today:   EKG: None today  Echo 09/25/2020 IMPRESSIONS     1. Left ventricular ejection fraction, by estimation, is 60 to 65%. The  left ventricle has normal function. The left ventricle has no regional  wall motion abnormalities. Left ventricular diastolic parameters are  consistent with Grade II diastolic  dysfunction (pseudonormalization).   2. Right ventricular  systolic function is normal. The right ventricular  size is normal. There is normal pulmonary artery systolic pressure.   3. The mitral valve is normal in structure. Trivial mitral valve  regurgitation. No evidence of mitral stenosis.   4. The aortic valve is normal in structure. Aortic valve regurgitation is  not visualized. No aortic stenosis is present.   5. The inferior vena cava is normal in size with greater than 50%  respiratory variability, suggesting right atrial pressure of 3 mmHg.   FINDINGS   Left Ventricle: Left ventricular ejection fraction, by estimation, is 60  to 65%. The left ventricle has normal function. The left ventricle has no  regional wall motion abnormalities. Definity contrast agent was given IV  to delineate the left ventricular   endocardial borders. The left ventricular internal cavity size was normal  in size.  There is no left ventricular hypertrophy. Left ventricular  diastolic parameters are consistent with Grade II diastolic dysfunction  (pseudonormalization).   Right Ventricle: The right ventricular size is normal. No increase in  right ventricular wall thickness. Right ventricular systolic function is  normal. There is normal pulmonary artery systolic pressure. The tricuspid  regurgitant velocity is 1.26 m/s, and   with an assumed right atrial pressure of 3 mmHg, the estimated right  ventricular systolic pressure is 9.4 mmHg.   Left Atrium: Left atrial size was normal in size.   Right Atrium: Right atrial size was normal in size.   Pericardium: There is no evidence of pericardial effusion.   Mitral Valve: The mitral valve is normal in structure. Trivial mitral  valve regurgitation. No evidence of mitral valve stenosis.   Tricuspid Valve: The tricuspid valve is normal in structure. Tricuspid  valve regurgitation is not demonstrated. No evidence of tricuspid  stenosis.   Aortic Valve: The aortic valve is normal in structure. Aortic valve   regurgitation is not visualized. No aortic stenosis is present.   Pulmonic Valve: The pulmonic valve was normal in structure. Pulmonic valve  regurgitation is not visualized. No evidence of pulmonic stenosis.   Aorta: The aortic root is normal in size and structure.   Venous: The inferior vena cava is normal in size with greater than 50%  respiratory variability, suggesting right atrial pressure of 3 mmHg.   IAS/Shunts: No atrial level shunt detected by color flow Doppler.   Ccta 09/2020 Aorta: Normal size.  No calcifications.  No dissection.   Aortic Valve:  Trileaflet.  No calcifications.   Coronary Arteries:  Normal coronary origin.  Right dominance.   RCA is a large dominant artery that gives rise to PDA and PLVB. There is no plaque.   Left main is a large artery that gives rise to LAD and LCX arteries.   LAD is a large vessel. There is minimal soft plaque in the mid LAD however this is not fully quantified due to stair-step artifact- will send for FFRct to assess flow.   LCX is a non-dominant artery that gives rise to one large OM1 branch. There is minimal calcified plaque in the proximal LCX.   Other findings:   Normal pulmonary vein drainage into the left atrium.   Normal left atrial appendage without a thrombus.   Normal size of the pulmonary artery.   IMPRESSION: 1. Coronary calcium score of 18.2. This was 68 percentile for age and sex matched control.   2. Normal coronary origin with right dominance.   3.Minimal CAD. CADRADS 1. This study will be send for FFRct due to poor quantification the suspected minimal soft LAD plaque.   Thomasene Ripple, DO      Recent Labs: 05/29/2021: ALT 33; Hemoglobin 14.5; Platelets 296; TSH 2.590 06/05/2021: BUN 12; Creatinine, Ser 0.85; Magnesium 2.0; Potassium 4.1; Sodium 141  Recent Lipid Panel    Component Value Date/Time   CHOL 209 (H) 05/29/2021 0840   TRIG 304 (H) 05/29/2021 0840   HDL 38 (L) 05/29/2021 0840    CHOLHDL 5.5 (H) 05/29/2021 0840   LDLCALC 118 (H) 05/29/2021 0840    Physical Exam:    VS:  BP 102/72 (BP Location: Left Arm)   Pulse 85   Ht 5\' 2"  (1.575 m)   Wt 245 lb (111.1 kg)   SpO2 95%   BMI 44.81 kg/m     Wt Readings from Last 3 Encounters:  06/05/21 245 lb (111.1  kg)  05/29/21 246 lb (111.6 kg)  04/30/21 246 lb (111.6 kg)     GEN: Well nourished, well developed in no acute distress HEENT: Normal NECK: No JVD; No carotid bruits LYMPHATICS: No lymphadenopathy CARDIAC: S1S2 noted,RRR, no murmurs, rubs, gallops RESPIRATORY:  Clear to auscultation without rales, wheezing or rhonchi  ABDOMEN: Soft, non-tender, non-distended, +bowel sounds, no guarding. EXTREMITIES: Bilateral +2 leg edema edema, No cyanosis, no clubbing MUSCULOSKELETAL:  No deformity  SKIN: Warm and dry NEUROLOGIC:  Alert and oriented x 3, non-focal PSYCHIATRIC:  Normal affect, good insight  ASSESSMENT:    1. Coronary artery disease involving native coronary artery of native heart, unspecified whether angina present   2. Bilateral leg pain   3. Benign hypertension   4. Mixed hyperlipidemia    PLAN:     1.  Clinical exam does shows evidence of bilateral leg edema and coupled with her diastolic dysfunction I would like to increase her Lasix to 40 mg daily.  Her blood pressure is marginal today so can cut back on her atenolol as we increase the Lasix.  2.  The episode of chest discomfort are very rare  Recommended to the patient to use sublingual nitroglycerin if she experienced any other symptoms.  If the symptoms starts to increase in frequency we will reassess the need for starting long-acting nitrates.  In terms of her leg pain and suspected claudication she does have risk factors we will consult the patient for screening with ankle-brachial index.  The patient understands the need to lose weight with diet and exercise. We have discussed specific strategies for this.  Hyperlipidemia - continue  with current statin medication.  The patient is in agreement with the above plan. The patient left the office in stable condition.  The patient will follow up in   Medication Adjustments/Labs and Tests Ordered: Current medicines are reviewed at length with the patient today.  Concerns regarding medicines are outlined above.  Orders Placed This Encounter  Procedures   Basic Metabolic Panel (BMET)   Magnesium   VAS [place] ABI WITH/WO TBI    Meds ordered this encounter  Medications   atenolol (TENORMIN) 25 MG tablet    Sig: Take 1 tablet (25 mg total) by mouth 2 (two) times daily.    Dispense:  180 tablet    Refill:  3   furosemide (LASIX) 40 MG tablet    Sig: Take 1 tablet (40 mg total) by mouth daily.    Dispense:  90 tablet    Refill:  3     Patient Instructions  Medication Instructions:  Your physician has recommended you make the following change in your medication:  DECREASE: Atenolol 25 mg once daily INCREASE: Lasix 40 mg once daily *If you need a refill on your cardiac medications before your next appointment, please call your pharmacy*   Lab Work: Your physician recommends that you return for lab work in:  TODAY: BMET, Mag If you have labs (blood work) drawn today and your tests are completely normal, you will receive your results only by: MyChart Message (if you have MyChart) OR A paper copy in the mail If you have any lab test that is abnormal or we need to change your treatment, we will call you to review the results.   Testing/Procedures: Your physician has requested that you have an ankle brachial index (ABI). During this test an ultrasound and blood pressure cuff are used to evaluate the arteries that supply the arms and legs with  blood. Allow thirty minutes for this exam. There are no restrictions or special instructions.    Follow-Up: At [facility], you and your health needs are our priority.  As part of our continuing mission to provide you with  exceptional heart care, we have created designated Provider Care Teams.  These Care Teams include your primary Cardiologist (physician) and Advanced Practice Providers (APPs -  Physician Assistants and Nurse Practitioners) who all work together to provide you with the care you need, when you need it.  We recommend signing up for the patient portal called "MyChart".  Sign up information is provided on this After Visit Summary.  MyChart is used to connect with patients for Virtual Visits (Telemedicine).  Patients are able to view lab/test results, encounter notes, upcoming appointments, etc.  Non-urgent messages can be sent to your provider as well.   To learn more about what you can do with MyChart, go to [email or web address].    Your next appointment:   12 week(s)  The format for your next appointment:   In Person  Provider:   Thomasene Ripple, DO [address], [place], [place] [address]    Other Instructions     Adopting a Healthy Lifestyle.  Know what a healthy weight is for you (roughly BMI <25) and aim to maintain this   Aim for 7+ servings of fruits and vegetables daily   65-80+ fluid ounces of water or unsweet tea for healthy kidneys   Limit to max 1 drink of alcohol per day; avoid smoking/tobacco   Limit animal fats in diet for cholesterol and heart health - choose grass fed whenever available   Avoid highly processed foods, and foods high in saturated/trans fats   Aim for low stress - take time to unwind and care for your mental health   Aim for 150 min of moderate intensity exercise weekly for heart health, and weights twice weekly for bone health   Aim for 7-9 hours of sleep daily   When it comes to diets, agreement about the perfect plan isnt easy to find, even among the experts. Experts at the [facility] [employer] developed an idea known as the Healthy Eating Plate. Just imagine a plate divided into logical, healthy portions.   The emphasis is on  diet quality:   Load up on vegetables and fruits - one-half of your plate: Aim for color and variety, and remember that potatoes dont count.   Go for whole grains - one-quarter of your plate: Whole wheat, barley, wheat berries, quinoa, oats, brown rice, and foods made with them. If you want pasta, go with whole wheat pasta.   Protein power - one-quarter of your plate: Fish, chicken, beans, and nuts are all healthy, versatile protein sources. Limit red meat.   The diet, however, does go beyond the plate, offering a few other suggestions.   Use healthy plant oils, such as olive, canola, soy, corn, sunflower and peanut. Check the labels, and avoid partially hydrogenated oil, which have unhealthy trans fats.   If youre thirsty, drink water. Coffee and tea are good in moderation, but skip sugary drinks and limit milk and dairy products to one or two daily servings.   The type of carbohydrate in the diet is more important than the amount. Some sources of carbohydrates, such as vegetables, fruits, whole grains, and beans-are healthier than others.   Finally, stay active  Signed, Thomasene Ripple, DO  06/05/2021 9:47 PM    [facility]  [facility]  

## 2021-07-01 ENCOUNTER — Other Ambulatory Visit: Payer: Self-pay | Admitting: Physician Assistant

## 2021-07-05 ENCOUNTER — Ambulatory Visit (INDEPENDENT_AMBULATORY_CARE_PROVIDER_SITE_OTHER): Payer: 59

## 2021-07-05 ENCOUNTER — Other Ambulatory Visit: Payer: Self-pay

## 2021-07-05 DIAGNOSIS — M79604 Pain in right leg: Secondary | ICD-10-CM

## 2021-07-05 DIAGNOSIS — M79605 Pain in left leg: Secondary | ICD-10-CM | POA: Diagnosis not present

## 2021-07-18 DIAGNOSIS — G43009 Migraine without aura, not intractable, without status migrainosus: Secondary | ICD-10-CM | POA: Diagnosis not present

## 2021-07-29 ENCOUNTER — Other Ambulatory Visit: Payer: Self-pay | Admitting: Physician Assistant

## 2021-08-07 ENCOUNTER — Telehealth: Payer: Self-pay

## 2021-08-07 ENCOUNTER — Other Ambulatory Visit: Payer: Self-pay | Admitting: Physician Assistant

## 2021-08-07 ENCOUNTER — Ambulatory Visit: Payer: 59 | Admitting: Physician Assistant

## 2021-08-07 MED ORDER — AMOXICILLIN 875 MG PO TABS: 875.0000 mg | ORAL_TABLET | Freq: Two times a day (BID) | ORAL | 0 refills | Status: AC

## 2021-08-07 NOTE — Telephone Encounter (Signed)
Patient made aware, verbalized understanding

## 2021-08-07 NOTE — Telephone Encounter (Signed)
Patient calling as she is having ST w/ redness and white patches. Requests just test and medication without being seen by provider. Made her aware this could not be done and would need to be seen face to face with a provider. She VU but states she is not able to come until Monday due to expense. She would have copayment with urgent care as well therefore this is not an option. Patient already has appointment for Monday.   Routing to provider as Lorain Childes.   Lorita Officer, CCMA 08/07/21 8:45 AM

## 2021-08-07 NOTE — Telephone Encounter (Signed)
Spoke with patient she is having multiple chronic problems but today she is complaining of sore throat and cough.Patient has an appointment Monday to talk about these things. Please advise

## 2021-08-12 ENCOUNTER — Encounter: Payer: Self-pay | Admitting: Physician Assistant

## 2021-08-12 ENCOUNTER — Ambulatory Visit: Payer: 59 | Admitting: Physician Assistant

## 2021-08-12 VITALS — BP 116/82 | HR 100 | Temp 97.2°F | Ht 62.0 in | Wt 241.4 lb

## 2021-08-12 DIAGNOSIS — R1084 Generalized abdominal pain: Secondary | ICD-10-CM

## 2021-08-12 DIAGNOSIS — J060 Acute laryngopharyngitis: Secondary | ICD-10-CM

## 2021-08-12 DIAGNOSIS — R198 Other specified symptoms and signs involving the digestive system and abdomen: Secondary | ICD-10-CM

## 2021-08-12 LAB — CBC WITH DIFFERENTIAL/PLATELET
Basophils Absolute: 0.1 10*3/uL (ref 0.0–0.2)
Basos: 1 %
EOS (ABSOLUTE): 0.2 10*3/uL (ref 0.0–0.4)
Eos: 4 %
Hematocrit: 46.3 % (ref 34.0–46.6)
Hemoglobin: 15.8 g/dL (ref 11.1–15.9)
Immature Grans (Abs): 0 10*3/uL (ref 0.0–0.1)
Immature Granulocytes: 0 %
Lymphocytes Absolute: 1.9 10*3/uL (ref 0.7–3.1)
Lymphs: 35 %
MCH: 31.1 pg (ref 26.6–33.0)
MCHC: 34.1 g/dL (ref 31.5–35.7)
MCV: 91 fL (ref 79–97)
Monocytes Absolute: 0.6 10*3/uL (ref 0.1–0.9)
Monocytes: 10 %
Neutrophils Absolute: 2.7 10*3/uL (ref 1.4–7.0)
Neutrophils: 50 %
Platelets: 316 10*3/uL (ref 150–450)
RBC: 5.08 x10E6/uL (ref 3.77–5.28)
RDW: 12.1 % (ref 11.7–15.4)
WBC: 5.5 10*3/uL (ref 3.4–10.8)

## 2021-08-12 LAB — COMPREHENSIVE METABOLIC PANEL
ALT: 72 IU/L — ABNORMAL HIGH (ref 0–32)
AST: 63 IU/L — ABNORMAL HIGH (ref 0–40)
Albumin/Globulin Ratio: 1.3 (ref 1.2–2.2)
Albumin: 4.3 g/dL (ref 3.8–4.9)
Alkaline Phosphatase: 199 IU/L — ABNORMAL HIGH (ref 44–121)
BUN/Creatinine Ratio: 10 (ref 9–23)
BUN: 9 mg/dL (ref 6–24)
Bilirubin Total: 0.5 mg/dL (ref 0.0–1.2)
CO2: 22 mmol/L (ref 20–29)
Calcium: 9.7 mg/dL (ref 8.7–10.2)
Chloride: 101 mmol/L (ref 96–106)
Creatinine, Ser: 0.9 mg/dL (ref 0.57–1.00)
Globulin, Total: 3.3 g/dL (ref 1.5–4.5)
Glucose: 116 mg/dL — ABNORMAL HIGH (ref 70–99)
Potassium: 3.7 mmol/L (ref 3.5–5.2)
Sodium: 140 mmol/L (ref 134–144)
Total Protein: 7.6 g/dL (ref 6.0–8.5)
eGFR: 76 mL/min/{1.73_m2} (ref >59)

## 2021-08-12 MED ORDER — BENZONATATE 100 MG PO CAPS
100.0000 mg | ORAL_CAPSULE | Freq: Two times a day (BID) | ORAL | 0 refills | Status: DC | PRN
Start: 2021-08-12 — End: 2022-06-26

## 2021-08-12 NOTE — Progress Notes (Signed)
Acute Office Visit  Subjective:    Patient ID: Cindy Hammond, female    DOB: 04-07-1966, 55 y.o.   MRN: [id number]  Chief Complaint  Patient presents with   Arm Pain   Knee Pain    In back of knees    HPI: Patient is in today for complaints of pains in both axillary areas, behind both knees and occasionally upper stomach - states has been going on for several months but now concerned because she has noted that the right side of her abdomen (upper and lower) appear larger than the left side and she has begun having generalized abdominal pain as well She did have colonoscopy last year which was normal States that she has had a change in er bowel functions over the past few months Denies nausea, vomiting or weight loss  Pt currently on amoxil for uri symptoms - requests refill of tessalon for cough - Past Medical History:  Diagnosis Date   Anxiety    Depression    GERD (gastroesophageal reflux disease)    Hyperlipidemia    Hypertension    Pain in joint, lower leg    Thrombocytopenia (HCC)     Past Surgical History:  Procedure Laterality Date   TUBAL LIGATION      Family History  Problem Relation Age of Onset   Hyperlipidemia Mother    Hypertension Mother    Hyperlipidemia Father    Hypertension Father     Social History   Socioeconomic History   Marital status: Married    Spouse name: Not on file   Number of children: 3   Years of education: Not on file   Highest education level: Not on file  Occupational History   Occupation: Unemployed  Tobacco Use   Smoking status: Former    Packs/day: 0.50    Years: 10.00    Pack years: 5.00    Types: Cigarettes    Quit date: 2000    Years since quitting: 22.9   Smokeless tobacco: Never  Vaping Use   Vaping Use: Never used  Substance and Sexual Activity   Alcohol use: No    Alcohol/week: 0.0 standard drinks   Drug use: No   Sexual activity: Yes    Partners: Male  Other Topics Concern   Not on file  Social  History Narrative   Not on file   Social Determinants of Health   Financial Resource Strain: Not on file  Food Insecurity: Not on file  Transportation Needs: Not on file  Physical Activity: Not on file  Stress: Not on file  Social Connections: Not on file  Intimate Partner Violence: Not on file    Outpatient Medications Prior to Visit  Medication Sig Dispense Refill   amitriptyline (ELAVIL) 150 MG tablet Take 1 tablet (150 mg total) by mouth at bedtime. 90 tablet 0   amoxicillin (AMOXIL) 875 MG tablet Take 1 tablet (875 mg total) by mouth 2 (two) times daily for 10 days. 20 tablet 0   atenolol (TENORMIN) 25 MG tablet Take 1 tablet (25 mg total) by mouth 2 (two) times daily. 180 tablet 3   atorvastatin (LIPITOR) 40 MG tablet TAKE 1 TABLET BY MOUTH ONCE DAILY 90 tablet 1   busPIRone (BUSPAR) 30 MG tablet Take 1 tablet (30 mg total) by mouth 2 (two) times daily. 180 tablet 1   clonazePAM (KLONOPIN) 0.5 MG tablet Take 1 tablet (0.5 mg total) by mouth 2 (two) times daily. 60 tablet 1   furosemide (  LASIX) 40 MG tablet Take 1 tablet (40 mg total) by mouth daily. 90 tablet 3   naproxen (NAPROSYN) 500 MG tablet Take 1 tablet (500 mg total) by mouth 2 (two) times daily with a meal. 180 tablet 1   omeprazole (PRILOSEC) 40 MG capsule Take 1 capsule (40 mg total) by mouth daily. 90 capsule 1   pregabalin (LYRICA) 75 MG capsule TAKE 1 CAPSULE BY MOUTH 3 TIMES DAILY. 90 capsule 1   QUEtiapine (SEROQUEL) 300 MG tablet TAKE 1 TABLET BY MOUTH DAILY AT BEDTIME. 90 tablet 1   topiramate (TOPAMAX) 100 MG tablet TAKE ONE (1) TABLET BY MOUTH 3 TIMES DAILY 270 tablet 1   Vitamin D, Ergocalciferol, (DRISDOL) 1.25 MG (50000 UNIT) CAPS capsule TAKE 1 CAPSULE TWICE A WEEK 24 capsule 3   nitroGLYCERIN (NITROSTAT) 0.4 MG SL tablet Place 1 tablet (0.4 mg total) under the tongue every 5 (five) minutes as needed for chest pain. 25 tablet 3   No facility-administered medications prior to visit.    Allergies   Allergen Reactions   Sumatriptan Hives and Rash   Sulfa Antibiotics     Review of Systems CONSTITUTIONAL: Negative for chills, fatigue, fever, unintentional weight gain and unintentional weight loss.  E/N/T: has pnd and mild sore throat CARDIOVASCULAR: Negative for chest pain, dizziness, palpitations and pedal edema.  RESPIRATORY: Negative for recent cough and dyspnea.  GASTROINTESTINAL: see HPI MSK: see HPI INTEGUMENTARY: Negative for rash.       Objective:    Physical Exam PHYSICAL EXAM:   VS: BP 116/82 (BP Location: Left Arm, Patient Position: Sitting, Cuff Size: Large)   Pulse 100   Temp (!) 97.2 F (36.2 C) (Temporal)   Ht 5' 2"  (1.575 m)   Wt 241 lb 6.4 oz (109.5 kg)   SpO2 92%   BMI 44.15 kg/m   GEN: Well nourished, well developed, in no acute distress - morbidly obese HEENT: normal external ears and nose - normal external auditory canals and TMS - hearing grossly normal - normal nasal mucosa and septum - Lips, Teeth and Gums - normal  Oropharynx - normal mucosa, palate, and posterior pharynx Cardiac: RRR; no murmurs, rubs, or gallops,no edema - no significant varicosities Respiratory:  normal respiratory rate and pattern with no distress - normal breath sounds with no rales, rhonchi, wheezes or rubs GI: normal bowel sounds, no masses or tenderness   BP 116/82 (BP Location: Left Arm, Patient Position: Sitting, Cuff Size: Large)   Pulse 100   Temp (!) 97.2 F (36.2 C) (Temporal)   Ht 5' 2"  (1.575 m)   Wt 241 lb 6.4 oz (109.5 kg)   SpO2 92%   BMI 44.15 kg/m  Wt Readings from Last 3 Encounters:  08/12/21 241 lb 6.4 oz (109.5 kg)  06/05/21 245 lb (111.1 kg)  05/29/21 246 lb (111.6 kg)    Health Maintenance Due  Topic Date Due   PAP SMEAR-Modifier  Never done    There are no preventive care reminders to display for this patient.   Lab Results  Component Value Date   TSH 2.590 05/29/2021   Lab Results  Component Value Date   WBC 8.5 05/29/2021    HGB 14.5 05/29/2021   HCT 43.0 05/29/2021   MCV 93 05/29/2021   PLT 296 05/29/2021   Lab Results  Component Value Date   NA 141 06/05/2021   K 4.1 06/05/2021   CO2 24 06/05/2021   GLUCOSE 97 06/05/2021   BUN 12 06/05/2021  CREATININE 0.85 06/05/2021   BILITOT 0.2 05/29/2021   ALKPHOS 178 (H) 05/29/2021   AST 23 05/29/2021   ALT 33 (H) 05/29/2021   PROT 6.8 05/29/2021   ALBUMIN 4.0 05/29/2021   CALCIUM 9.2 06/05/2021   EGFR 81 06/05/2021   Lab Results  Component Value Date   CHOL 209 (H) 05/29/2021   Lab Results  Component Value Date   HDL 38 (L) 05/29/2021   Lab Results  Component Value Date   LDLCALC 118 (H) 05/29/2021   Lab Results  Component Value Date   TRIG 304 (H) 05/29/2021   Lab Results  Component Value Date   CHOLHDL 5.5 (H) 05/29/2021   Lab Results  Component Value Date   HGBA1C 6.1 (H) 02/18/2021       Assessment & Plan:   Problem List Items Addressed This Visit       Respiratory   Acute laryngopharyngitis   Relevant Medications   benzonatate (TESSALON) 100 MG capsule Finish amoxil as directed   Other Visit Diagnoses     Generalized abdominal pain    -  Primary   Relevant Orders   CBC with Differential/Platelet   Comprehensive metabolic panel   CT Abdomen Pelvis W Contrast   Change in bowel function       Relevant Orders   CT Abdomen Pelvis W Contrast      Meds ordered this encounter  Medications   benzonatate (TESSALON) 100 MG capsule    Sig: Take 1 capsule (100 mg total) by mouth 2 (two) times daily as needed for cough.    Dispense:  30 capsule    Refill:  0    Order Specific Question:   Supervising Provider    AnswerShelton Silvas    Orders Placed This Encounter  Procedures   CT Abdomen Pelvis W Contrast   CBC with Differential/Platelet   Comprehensive metabolic panel     Follow-up: Return in about 3 weeks (around 09/02/2021) for follow up.  An After Visit Summary was printed and given to the  patient.  Yetta Flock [facility] [phone]

## 2021-08-14 ENCOUNTER — Telehealth: Payer: Self-pay | Admitting: Physician Assistant

## 2021-08-14 NOTE — Telephone Encounter (Signed)
° °  Cindy Hammond has been scheduled for the following appointment:  WHAT: ABDOMEN/PELVIS CT WHERE: [facility] DATE: 08/23/21 TIME: 7:45 AM ARRIVAL TIME  Patient has been made aware. PICK UP PREP KIT FROM OUTPATIENT CENTER, NO SOLID FOODS 2 HRS PRIOR

## 2021-08-19 DIAGNOSIS — Z01419 Encounter for gynecological examination (general) (routine) without abnormal findings: Secondary | ICD-10-CM | POA: Diagnosis not present

## 2021-08-19 DIAGNOSIS — R8290 Unspecified abnormal findings in urine: Secondary | ICD-10-CM | POA: Diagnosis not present

## 2021-08-25 ENCOUNTER — Encounter (HOSPITAL_COMMUNITY): Payer: Self-pay

## 2021-08-25 ENCOUNTER — Emergency Department (HOSPITAL_COMMUNITY): Payer: 59

## 2021-08-25 ENCOUNTER — Other Ambulatory Visit: Payer: Self-pay

## 2021-08-25 ENCOUNTER — Emergency Department (HOSPITAL_COMMUNITY)
Admission: EM | Admit: 2021-08-25 | Discharge: 2021-08-25 | Disposition: A | Discharge status: Home / Self Care | Payer: 59 | Attending: Emergency Medicine | Admitting: Emergency Medicine

## 2021-08-25 DIAGNOSIS — R519 Headache, unspecified: Secondary | ICD-10-CM | POA: Diagnosis not present

## 2021-08-25 DIAGNOSIS — Z79899 Other long term (current) drug therapy: Secondary | ICD-10-CM | POA: Insufficient documentation

## 2021-08-25 DIAGNOSIS — I10 Essential (primary) hypertension: Secondary | ICD-10-CM | POA: Insufficient documentation

## 2021-08-25 DIAGNOSIS — Z87891 Personal history of nicotine dependence: Secondary | ICD-10-CM | POA: Diagnosis not present

## 2021-08-25 DIAGNOSIS — R202 Paresthesia of skin: Secondary | ICD-10-CM | POA: Diagnosis not present

## 2021-08-25 DIAGNOSIS — G8929 Other chronic pain: Secondary | ICD-10-CM

## 2021-08-25 DIAGNOSIS — R0602 Shortness of breath: Secondary | ICD-10-CM | POA: Diagnosis not present

## 2021-08-25 DIAGNOSIS — J4540 Moderate persistent asthma, uncomplicated: Secondary | ICD-10-CM | POA: Insufficient documentation

## 2021-08-25 DIAGNOSIS — R531 Weakness: Secondary | ICD-10-CM | POA: Diagnosis not present

## 2021-08-25 LAB — BASIC METABOLIC PANEL
Anion gap: 9 (ref 5–15)
BUN: 9 mg/dL (ref 6–20)
CO2: 24 mmol/L (ref 22–32)
Calcium: 9 mg/dL (ref 8.9–10.3)
Chloride: 107 mmol/L (ref 98–111)
Creatinine, Ser: 0.87 mg/dL (ref 0.44–1.00)
GFR, Estimated: >60 mL/min (ref >60)
Glucose, Bld: 120 mg/dL — ABNORMAL HIGH (ref 70–99)
Potassium: 3.6 mmol/L (ref 3.5–5.1)
Sodium: 140 mmol/L (ref 135–145)

## 2021-08-25 LAB — CBC
HCT: 45.2 % (ref 36.0–46.0)
Hemoglobin: 15.1 g/dL — ABNORMAL HIGH (ref 12.0–15.0)
MCH: 31.9 pg (ref 26.0–34.0)
MCHC: 33.4 g/dL (ref 30.0–36.0)
MCV: 95.4 fL (ref 80.0–100.0)
Platelets: 309 10*3/uL (ref 150–400)
RBC: 4.74 MIL/uL (ref 3.87–5.11)
RDW: 13.6 % (ref 11.5–15.5)
WBC: 7.1 10*3/uL (ref 4.0–10.5)
nRBC: 0 % (ref 0.0–0.2)

## 2021-08-25 MED ORDER — FENTANYL CITRATE PF 50 MCG/ML IJ SOSY
25.0000 ug | PREFILLED_SYRINGE | Freq: Once | INTRAMUSCULAR | Status: AC
  Administered 2021-08-25: 11:00:00 25 ug via INTRAVENOUS
  Filled 2021-08-25: qty 1

## 2021-08-25 MED ORDER — ONDANSETRON HCL 4 MG/2ML IJ SOLN
4.0000 mg | Freq: Once | INTRAMUSCULAR | Status: AC
  Administered 2021-08-25: 10:00:00 4 mg via INTRAVENOUS
  Filled 2021-08-25: qty 2

## 2021-08-25 MED ORDER — METOCLOPRAMIDE HCL 5 MG/ML IJ SOLN
10.0000 mg | Freq: Once | INTRAMUSCULAR | Status: AC
  Administered 2021-08-25: 13:00:00 10 mg via INTRAVENOUS
  Filled 2021-08-25: qty 2

## 2021-08-25 MED ORDER — KETOROLAC TROMETHAMINE 15 MG/ML IJ SOLN
15.0000 mg | Freq: Once | INTRAMUSCULAR | Status: AC
  Administered 2021-08-25: 13:00:00 15 mg via INTRAVENOUS
  Filled 2021-08-25: qty 1

## 2021-08-25 MED ORDER — DEXAMETHASONE SODIUM PHOSPHATE 10 MG/ML IJ SOLN
10.0000 mg | Freq: Once | INTRAMUSCULAR | Status: AC
  Administered 2021-08-25: 13:00:00 10 mg via INTRAVENOUS
  Filled 2021-08-25: qty 1

## 2021-08-25 MED ORDER — FENTANYL CITRATE PF 50 MCG/ML IJ SOSY
25.0000 ug | PREFILLED_SYRINGE | Freq: Once | INTRAMUSCULAR | Status: AC
  Administered 2021-08-25: 10:00:00 25 ug via INTRAVENOUS
  Filled 2021-08-25: qty 1

## 2021-08-25 NOTE — ED Notes (Signed)
Patient transported to MRI 

## 2021-08-25 NOTE — ED Provider Notes (Addendum)
Emergency Medicine Provider Triage Evaluation Note  Cindy Hammond , a 55 y.o. female  was evaluated in triage.  Pt complains of weakness. She states that same began when she woke up this morning, is now mostly resolved but she now has a headache which is unusual for her. She is also nauseous. Last known well 1030 last night. No history of CVA.  Review of Systems  Positive:  Negative: See above  Physical Exam  Ht 5\' 1"  (1.549 m)    Wt 108.9 kg    BMI 45.35 kg/m  Gen:   Awake, no distress   Resp:  Normal effort  MSK:   Moves extremities without difficulty  Other:  Alert and oriented, neurologically intact NIH 0  Medical Decision Making  Medically screening exam initiated at 8:19 AM.  Appropriate orders placed.  Jamerica Snavely was informed that the remainder of the evaluation will be completed by another provider, this initial triage assessment does not replace that evaluation, and the importance of remaining in the ED until their evaluation is complete.     Humberto Leep, PA-C 08/25/21 0827    08/27/21, PA-C 08/25/21 08/27/21    Libby Maw, MD 08/25/21 1104

## 2021-08-25 NOTE — ED Triage Notes (Addendum)
Pt arrived POV from home c/o right sided weakness, headache upon waking up. Pt LKW was 1030 pm last night. Pt is also c/o of nausea. Pt as no other neuro deficits. Pt's NIH is zero.

## 2021-08-25 NOTE — ED Notes (Signed)
Pt verbalizes understanding of discharge instructions. Opportunity for questions and answers were provided. Pt discharged from the ED.   ?

## 2021-08-25 NOTE — Discharge Instructions (Addendum)
You were seen here today for migratory right sided weakness and a headache. Your CT and MRI do not show any signs of a stroke. You lab work is normal, other than your blood glucose being slightly elevated. Please follow up with your PCP about this. Although your MRI did not show any stroke, it showed changes that may be consistent with chronic migraines. Please follow up with your neurologist about this. You can take ibuprofen or tylenol as needed for pain. Please make sure to stay well hydrated with fluids, mainly water.  If you have any one-sided weakness, slurred speech, facial droop, syncope, blurry vision, please return the nearest emergency department for evaluation.  If you have any new or worsening symptoms, please return to the ED for re-evaluation.

## 2021-08-25 NOTE — ED Provider Notes (Signed)
Cindy Hammond Provider Note   CSN: [id number] Arrival date & time: 08/25/21  0802     History Chief Complaint  Patient presents with   Weakness   Headache    Cindy Hammond is a 55 y.o. female presents to the ED for eval of right sided numbness and weakness with her face, arm, and leg along with a headache and nausea since 0500 this morning. She reports she is improving since. LKW was 2230 yesterday before the patient went to bed. The patient has a history of chronic migraines and reports she has been under a lot of stress lately. Additionally, she reports she has had a right eye "floater" for the past 2-3 weeks. She denies any speech trouble, trouble walking, drooling confusion, or facial droop. Denies any chest pain or SOB. Denies any abdominal pain, vomiting, URI symptoms, or recent illness.  Medical history includes asthma, migraines (sees Neurology), hyperlipidemia, fibromyalgia. Allergic to Sumitriptan and Bactrim. Former smoker.   Weakness Associated symptoms: headaches and nausea   Associated symptoms: no abdominal pain, no arthralgias, no chest pain, no cough, no diarrhea, no dizziness, no dysuria, no fever, no seizures, no shortness of breath and no vomiting   Headache Associated symptoms: nausea, photophobia and weakness   Associated symptoms: no abdominal pain, no back pain, no cough, no diarrhea, no dizziness, no ear pain, no eye pain, no fever, no numbness, no seizures, no sore throat and no vomiting       Past Medical History:  Diagnosis Date   Anxiety    Depression    GERD (gastroesophageal reflux disease)    Hyperlipidemia    Hypertension    Pain in joint, lower leg    Thrombocytopenia (HCC)     Patient Active Problem List   Diagnosis Date Noted   Need for prophylactic vaccination against Streptococcus pneumoniae (pneumococcus) 02/18/2021   Abnormal chest x-ray with multiple lung nodules 10/26/2020   Asthma in adult,  moderate persistent, with status asthmaticus 10/26/2020   Fatty liver disease, nonalcoholic [id number]   Kyphosis deformity of spine 10/26/2020   Multicystic dysplastic kidney (MCDK) 10/26/2020   Pharyngitis 10/26/2020   Fibromyalgia 09/21/2020   Sleep apnea in adult 09/21/2020   Precordial pain 08/17/2020   Morbid obesity ([place]) 08/17/2020   Shortness of breath 08/17/2020   Thrombocytopenia (HCC)    Pain in joint, lower leg    Hypertension    Hyperlipidemia    GERD (gastroesophageal reflux disease)    Depression    Acute laryngopharyngitis 05/22/2020   Need for prophylactic vaccination and inoculation against influenza 05/03/2020   Prediabetes 01/31/2020   Migraine 01/31/2020   Other fatigue 10/26/2019   Vitamin D deficiency 10/26/2019   Mixed hyperlipidemia 10/26/2019   Benign hypertension 10/26/2019   Gastroesophageal reflux disease without esophagitis 10/26/2019   Anxiety 06/20/2015   Asthma 06/20/2015    Past Surgical History:  Procedure Laterality Date   TUBAL LIGATION       OB History   No obstetric history on file.     Family History  Problem Relation Age of Onset   Hyperlipidemia Mother    Hypertension Mother    Hyperlipidemia Father    Hypertension Father     Social History   Tobacco Use   Smoking status: Former    Packs/day: 0.50    Years: 10.00    Pack years: 5.00    Types: Cigarettes    Quit date: 2000    Years since quitting: 23.0  Smokeless tobacco: Never  Vaping Use   Vaping Use: Never used  Substance Use Topics   Alcohol use: No    Alcohol/week: 0.0 standard drinks   Drug use: No    Home Medications Prior to Admission medications   Medication Sig Start Date End Date Taking? Authorizing Provider  albuterol (VENTOLIN HFA) 108 (90 Base) MCG/ACT inhaler Inhale 1-2 puffs into the lungs every 6 (six) hours as needed for wheezing or shortness of breath.   Yes [provider]  amitriptyline (ELAVIL) 150 MG tablet Take 1 tablet  (150 mg total) by mouth at bedtime. 04/21/21  Yes Cox, Kirsten, MD  atenolol (TENORMIN) 25 MG tablet Take 1 tablet (25 mg total) by mouth 2 (two) times daily. Patient taking differently: Take 50 mg by mouth every morning. 06/05/21 09/03/21 Yes Tobb, Kardie, DO  atorvastatin (LIPITOR) 40 MG tablet TAKE 1 TABLET BY MOUTH ONCE DAILY Patient taking differently: Take 40 mg by mouth daily. 06/05/21  Yes Marge Duncans, PA-C  benzonatate (TESSALON) 100 MG capsule Take 1 capsule (100 mg total) by mouth 2 (two) times daily as needed for cough. 08/12/21  Yes Marge Duncans, PA-C  furosemide (LASIX) 40 MG tablet Take 1 tablet (40 mg total) by mouth daily. 06/05/21 09/03/21 Yes Tobb, Kardie, DO  KRILL OIL PO Take 1 capsule by mouth 2 (two) times daily.   Yes [provider]  nitroGLYCERIN (NITROSTAT) 0.4 MG SL tablet Place 1 tablet (0.4 mg total) under the tongue every 5 (five) minutes as needed for chest pain. 08/17/20 08/25/21 Yes Tobb, Kardie, DO  omeprazole (PRILOSEC) 40 MG capsule Take 1 capsule (40 mg total) by mouth daily. Patient taking differently: Take 40 mg by mouth daily as needed (acid reflux). 05/29/21  Yes Marge Duncans, PA-C  pregabalin (LYRICA) 75 MG capsule TAKE 1 CAPSULE BY MOUTH 3 TIMES DAILY. Patient taking differently: Take 75 mg by mouth 3 (three) times daily. 07/01/21  Yes Marge Duncans, PA-C  QUEtiapine (SEROQUEL) 300 MG tablet TAKE 1 TABLET BY MOUTH DAILY AT BEDTIME. Patient taking differently: Take 300 mg by mouth at bedtime. 11/23/20  Yes Marge Duncans, PA-C  topiramate (TOPAMAX) 100 MG tablet TAKE ONE (1) TABLET BY MOUTH 3 TIMES DAILY Patient taking differently: Take 100 mg by mouth 2 (two) times daily. 07/29/21  Yes Marge Duncans, PA-C  Ubrogepant (UBRELVY) 100 MG TABS Take 100 mg by mouth 2 (two) times daily as needed (migraine).   Yes [provider]  Vitamin D, Ergocalciferol, (DRISDOL) 1.25 MG (50000 UNIT) CAPS capsule TAKE 1 CAPSULE TWICE A WEEK Patient taking differently: Take  100,000 Units by mouth every 7 (seven) days. 02/07/21  Yes Cox, Kirsten, MD  busPIRone (BUSPAR) 30 MG tablet Take 1 tablet (30 mg total) by mouth 2 (two) times daily. Patient not taking: Reported on 08/25/2021 03/06/21   Marge Duncans, PA-C  clonazePAM (KLONOPIN) 0.5 MG tablet TAKE 1 TABLET BY MOUTH TWICE DAILY. 08/27/21   CoxElnita Maxwell, MD    Allergies    Sumatriptan and Sulfa antibiotics  Review of Systems   Review of Systems  Constitutional:  Negative for chills and fever.  HENT:  Negative for ear pain and sore throat.   Eyes:  Positive for photophobia. Negative for pain and visual disturbance.  Respiratory:  Negative for cough and shortness of breath.   Cardiovascular:  Negative for chest pain and palpitations.  Gastrointestinal:  Positive for nausea. Negative for abdominal pain, constipation, diarrhea and vomiting.  Genitourinary:  Negative for dysuria and  hematuria.  Musculoskeletal:  Negative for arthralgias and back pain.  Skin:  Negative for color change and rash.  Neurological:  Positive for weakness and headaches. Negative for dizziness, seizures, syncope, facial asymmetry, speech difficulty, light-headedness and numbness.  All other systems reviewed and are negative.  Physical Exam Updated Vital Signs BP 109/72    Pulse 91    Temp 97.6 F (36.4 C) (Oral)    Resp (!) 21    Ht 5\' 1"  (1.549 m)    Wt 108.9 kg    SpO2 92%    BMI 45.35 kg/m   Physical Exam Vitals and nursing note reviewed.  Constitutional:      General: She is not in acute distress.    Appearance: Normal appearance. She is not toxic-appearing.  HENT:     Head: Normocephalic and atraumatic.     Mouth/Throat:     Mouth: Mucous membranes are moist.  Eyes:     General: No visual field deficit or scleral icterus.    Extraocular Movements: Extraocular movements intact.     Right eye: No nystagmus.     Left eye: No nystagmus.     Pupils: Pupils are equal, round, and reactive to light.  Cardiovascular:     Rate  and Rhythm: Normal rate and regular rhythm.  Pulmonary:     Effort: Pulmonary effort is normal. No respiratory distress.     Breath sounds: Normal breath sounds.  Abdominal:     General: Abdomen is flat. Bowel sounds are normal.     Palpations: Abdomen is soft.     Comments: Abdominal exam limited secondary to body habitus  Musculoskeletal:        General: No swelling, tenderness or deformity. Normal range of motion.     Cervical back: Normal range of motion.  Skin:    General: Skin is warm and dry.  Neurological:     General: No focal deficit present.     Mental Status: She is alert and oriented to person, place, and time. Mental status is at baseline.     GCS: GCS eye subscore is 4. GCS verbal subscore is 5. GCS motor subscore is 6.     Cranial Nerves: No cranial nerve deficit, dysarthria or facial asymmetry.     Sensory: No sensory deficit.     Coordination: Romberg sign negative. Coordination normal.     Gait: Gait normal.     Comments: No facial droop noted.  Cranial nerves II through XII grossly intact.  The patient is answering questions appropriately and speaking in full sentences appropriately.  No pronator drift.  Strength is 5 out of 5 in upper and lower bilateral extremities.  Sensations intact bilaterally.  Compartments are soft.  Finger-nose intact.  Rapid hand motions intact.  Patient has PERRLA and EOMI although the light is bothering her eyes.  GCS of 15.  Alert and oriented x3    ED Results / Procedures / Treatments   Labs (all labs ordered are listed, but only abnormal results are displayed) Labs Reviewed  BASIC METABOLIC PANEL - Abnormal; Notable for the following components:      Result Value   Glucose, Bld 120 (*)    All other components within normal limits  CBC - Abnormal; Notable for the following components:   Hemoglobin 15.1 (*)    All other components within normal limits  CBG MONITORING, ED    EKG EKG Interpretation  Date/Time:  Sunday August 25 2021 08:10:27 EST Ventricular Rate:  86 PR Interval:  168 QRS Duration: 84 QT Interval:  380 QTC Calculation: 454 R Axis:   74 Text Interpretation: Normal sinus rhythm Nonspecific ST and T wave abnormality Abnormal ECG No old tracing to compare Confirmed by Linwood Dibbles [phone]) on 08/25/2021 10:20:38 AM  Radiology No results found.  Procedures Procedures   Medications Ordered in ED Medications  ondansetron (ZOFRAN) injection 4 mg (4 mg Intravenous Given 08/25/21 1000)  fentaNYL (SUBLIMAZE) injection 25 mcg (25 mcg Intravenous Given 08/25/21 1002)  fentaNYL (SUBLIMAZE) injection 25 mcg (25 mcg Intravenous Given 08/25/21 1113)  metoCLOPramide (REGLAN) injection 10 mg (10 mg Intravenous Given 08/25/21 1318)  dexamethasone (DECADRON) injection 10 mg (10 mg Intravenous Given 08/25/21 1315)  ketorolac (TORADOL) 15 MG/ML injection 15 mg (15 mg Intravenous Given 08/25/21 1317)    ED Course  I have reviewed the triage vital signs and the nursing notes.  Pertinent labs & imaging results that were available during my care of the patient were reviewed by me and considered in my medical decision making (see chart for details).  55 year old female presents emergency Hammond for evaluation of strokelike symptoms since 0500 today.  Differential diagnosis includes is not limited to stroke, TIA, migraine.  Will order head CT and basic lab work.  BMP shows mildly elevated glucose of 120, however no electrolyte abnormality.  CBC shows mildly increased hemoglobin, but no leukocytosis.  Chest x-ray shows no active cardiopulmonary process.  Head CT shows no acute intracranial abnormality. MRI shows mild multifocal T2 FLAIR hyperintense signal abnormality within the cerebral white matter  and pons, nonspecific but most often secondary to chronic small vessel ischemia. Potential alternative considerations include sequela of chronic migraine headaches, sequela of a prior infectious/inflammatory process or  sequela of a demyelinating process (such as multiple sclerosis). Discussed this finding with my attending who recommended follow-up with her established neurologist.  This patient is likely experiencing a migraine as she has improved with a migraine cocktail.  Patient reports that this may have been a migraine for her as she is been under a lot of stress lately, but just want to make sure she was not having a stroke.  I recommended that she follow-up with her neurologist soon for reevaluation and to let them know of this episode.  I discussed with her that the imaging did not show she had a stroke and physical exam is very reassuring.  Return precautions given.  Patient agrees with plan.  Patient is stable being discharged home in good condition.  I discussed this case with my attending physician who cosigned this note including patient's presenting symptoms, physical exam, and planned diagnostics and interventions. Attending physician stated agreement with plan or made changes to plan which were implemented.     MDM Rules/Calculators/A&P                          Final Clinical Impression(s) / ED Diagnoses Final diagnoses:  Weakness  Chronic nonintractable headache, unspecified headache type    Rx / DC Orders ED Discharge Orders     None        Achille Rich, PA-C 08/28/21 1058    Linwood Dibbles, MD 08/28/21 1337

## 2021-08-26 ENCOUNTER — Other Ambulatory Visit: Payer: Self-pay | Admitting: Physician Assistant

## 2021-08-26 DIAGNOSIS — F419 Anxiety disorder, unspecified: Secondary | ICD-10-CM

## 2021-08-28 ENCOUNTER — Other Ambulatory Visit: Payer: Self-pay

## 2021-08-28 ENCOUNTER — Ambulatory Visit (INDEPENDENT_AMBULATORY_CARE_PROVIDER_SITE_OTHER): Payer: 59 | Admitting: Cardiology

## 2021-08-28 ENCOUNTER — Other Ambulatory Visit: Payer: Self-pay | Admitting: Cardiology

## 2021-08-28 ENCOUNTER — Encounter: Payer: Self-pay | Admitting: Cardiology

## 2021-08-28 VITALS — BP 112/64 | HR 85 | Ht 62.0 in | Wt 241.8 lb

## 2021-08-28 DIAGNOSIS — I2510 Atherosclerotic heart disease of native coronary artery without angina pectoris: Secondary | ICD-10-CM | POA: Diagnosis not present

## 2021-08-28 DIAGNOSIS — E782 Mixed hyperlipidemia: Secondary | ICD-10-CM

## 2021-08-28 DIAGNOSIS — R079 Chest pain, unspecified: Secondary | ICD-10-CM | POA: Diagnosis not present

## 2021-08-28 MED ORDER — ISOSORBIDE MONONITRATE ER 30 MG PO TB24: 15.0000 mg | ORAL_TABLET | Freq: Every day | ORAL | 3 refills | Status: DC

## 2021-08-28 NOTE — Patient Instructions (Signed)
Medication Instructions:  Your physician has recommended you make the following change in your medication:  START: Imdur 15 mg once daily *If you need a refill on your cardiac medications before your next appointment, please call your pharmacy*   Lab Work: None If you have labs (blood work) drawn today and your tests are completely normal, you will receive your results only by: MyChart Message (if you have MyChart) OR A paper copy in the mail If you have any lab test that is abnormal or we need to change your treatment, we will call you to review the results.   Testing/Procedures: None   Follow-Up: At [facility], you and your health needs are our priority.  As part of our continuing mission to provide you with exceptional heart care, we have created designated Provider Care Teams.  These Care Teams include your primary Cardiologist (physician) and Advanced Practice Providers (APPs -  Physician Assistants and Nurse Practitioners) who all work together to provide you with the care you need, when you need it.  We recommend signing up for the patient portal called "MyChart".  Sign up information is provided on this After Visit Summary.  MyChart is used to connect with patients for Virtual Visits (Telemedicine).  Patients are able to view lab/test results, encounter notes, upcoming appointments, etc.  Non-urgent messages can be sent to your provider as well.   To learn more about what you can do with MyChart, go to [email or web address].    Your next appointment:   6 month(s)  The format for your next appointment:   In Person  Provider:   Thomasene Ripple, DO     Other Instructions

## 2021-08-28 NOTE — Progress Notes (Signed)
Cardiology Office Note:    Date:  08/29/2021   ID:  Cindy Hammond, DOB Sep 27, 1965, MRN [id number]  PCP:  Marianne Sofia, PA-C  Cardiologist:  Thomasene Ripple, DO  Electrophysiologist:  None   Referring MD: Marianne Sofia, PA-C   " I had a scare on christmas day"   History of Present Illness:    Cindy Hammond is a 55 y.o. female with a hx of nonobstructive minimal coronary artery disease seen on coronary CTA done in January 2022, hypertension, hyperlipidemia, chronic diastolic heart failure is here today for follow-up visit.   I saw the patient for the 2021 at that time she experiencing some shortness of and the patient for echocardiogram test will coronary CTA.  In the interim she was able to get these testings done.  I saw the patient on June 06, 2021 at that time she did have some bilateral leg edema and started patient on Lasix 40 mg daily.  Did cut back on her atenolol to help with her blood pressure.  She has not tried sublingual nitroglycerin for her chest discomfort.  I encouraged her to do so.  Since I saw the patient she had been seen in emergency department at [facility] because she presented for left-sided weakness and slurred speech.  she did get imaging with ct scan of the head without contrast as well as an mri.  there was evidence of hyperintensity within the cerebral white matter which needs to be pursued and she is planning to see neurology.  She still is experiencing intermittent chest discomfort.  Past Medical History:  Diagnosis Date   Anxiety    Depression    GERD (gastroesophageal reflux disease)    Hyperlipidemia    Hypertension    Pain in joint, lower leg    Thrombocytopenia (HCC)     Past Surgical History:  Procedure Laterality Date   TUBAL LIGATION      Current Medications: Current Meds  Medication Sig   albuterol (VENTOLIN HFA) 108 (90 Base) MCG/ACT inhaler Inhale 1-2 puffs into the lungs every 6 (six) hours as needed for wheezing or shortness of  breath.   amitriptyline (ELAVIL) 150 MG tablet Take 1 tablet (150 mg total) by mouth at bedtime.   atenolol (TENORMIN) 25 MG tablet Take 1 tablet (25 mg total) by mouth 2 (two) times daily. (Patient taking differently: Take 50 mg by mouth every morning.)   atorvastatin (LIPITOR) 40 MG tablet TAKE 1 TABLET BY MOUTH ONCE DAILY (Patient taking differently: Take 40 mg by mouth daily.)   benzonatate (TESSALON) 100 MG capsule Take 1 capsule (100 mg total) by mouth 2 (two) times daily as needed for cough.   busPIRone (BUSPAR) 30 MG tablet Take 1 tablet (30 mg total) by mouth 2 (two) times daily.   clonazePAM (KLONOPIN) 0.5 MG tablet TAKE 1 TABLET BY MOUTH TWICE DAILY.   furosemide (LASIX) 40 MG tablet Take 1 tablet (40 mg total) by mouth daily.   isosorbide mononitrate (IMDUR) 30 MG 24 hr tablet Take 0.5 tablets (15 mg total) by mouth daily.   KRILL OIL PO Take 1 capsule by mouth 2 (two) times daily.   omeprazole (PRILOSEC) 40 MG capsule Take 1 capsule (40 mg total) by mouth daily. (Patient taking differently: Take 40 mg by mouth daily as needed (acid reflux).)   pregabalin (LYRICA) 75 MG capsule TAKE 1 CAPSULE BY MOUTH 3 TIMES DAILY. (Patient taking differently: Take 75 mg by mouth 3 (three) times daily.)   QUEtiapine (SEROQUEL)  300 MG tablet TAKE 1 TABLET BY MOUTH DAILY AT BEDTIME. (Patient taking differently: Take 300 mg by mouth at bedtime.)   topiramate (TOPAMAX) 100 MG tablet TAKE ONE (1) TABLET BY MOUTH 3 TIMES DAILY (Patient taking differently: Take 100 mg by mouth 2 (two) times daily.)   Ubrogepant (UBRELVY) 100 MG TABS Take 100 mg by mouth 2 (two) times daily as needed (migraine).   Vitamin D, Ergocalciferol, (DRISDOL) 1.25 MG (50000 UNIT) CAPS capsule TAKE 1 CAPSULE TWICE A WEEK (Patient taking differently: Take 100,000 Units by mouth every 7 (seven) days.)     Allergies:   Sumatriptan and Sulfa antibiotics   Social History   Socioeconomic History   Marital status: Married    Spouse name:  Not on file   Number of children: 3   Years of education: Not on file   Highest education level: Not on file  Occupational History   Occupation: Unemployed  Tobacco Use   Smoking status: Former    Packs/day: 0.50    Years: 10.00    Pack years: 5.00    Types: Cigarettes    Quit date: 2000    Years since quitting: 23.0   Smokeless tobacco: Never  Vaping Use   Vaping Use: Never used  Substance and Sexual Activity   Alcohol use: No    Alcohol/week: 0.0 standard drinks   Drug use: No   Sexual activity: Yes    Partners: Male  Other Topics Concern   Not on file  Social History Narrative   Not on file   Social Determinants of Health   Financial Resource Strain: Not on file  Food Insecurity: Not on file  Transportation Needs: Not on file  Physical Activity: Not on file  Stress: Not on file  Social Connections: Not on file     Family History: The patient's family history includes Hyperlipidemia in her father and mother; Hypertension in her father and mother.  ROS:   Review of Systems  Constitution: Negative for decreased appetite, fever and weight gain.  HENT: Negative for congestion, ear discharge, hoarse voice and sore throat.   Eyes: Negative for discharge, redness, vision loss in right eye and visual halos.  Cardiovascular: Negative for chest pain, dyspnea on exertion, leg swelling, orthopnea and palpitations.  Respiratory: Negative for cough, hemoptysis, shortness of breath and snoring.   Endocrine: Negative for heat intolerance and polyphagia.  Hematologic/Lymphatic: Negative for bleeding problem. Does not bruise/bleed easily.  Skin: Negative for flushing, nail changes, rash and suspicious lesions.  Musculoskeletal: Negative for arthritis, joint pain, muscle cramps, myalgias, neck pain and stiffness.  Gastrointestinal: Negative for abdominal pain, bowel incontinence, diarrhea and excessive appetite.  Genitourinary: Negative for decreased libido, genital sores and  incomplete emptying.  Neurological: Negative for brief paralysis, focal weakness, headaches and loss of balance.  Psychiatric/Behavioral: Negative for altered mental status, depression and suicidal ideas.  Allergic/Immunologic: Negative for HIV exposure and persistent infections.    EKGs/Labs/Other Studies Reviewed:    The following studies were reviewed today:   EKG: None today  TTE 08/25/2021 IMPRESSION: No evidence of acute intracranial abnormality.   Mild multifocal T2 FLAIR hyperintense signal abnormality within the cerebral white matter and pons, nonspecific but most often secondary to chronic small vessel ischemia. Potential alternative considerations include sequela of chronic migraine headaches, sequela of a prior infectious/inflammatory process or sequela of a demyelinating process (such as multiple sclerosis).   Mild paranasal sinus mucosal thickening, most notably right maxillary.   Trace fluid within the  left mastoid air cells.    CT head 08/25/2021 TECHNIQUE: Contiguous axial images were obtained from the base of the skull through the vertex without intravenous contrast.   COMPARISON:  09/01/2014   FINDINGS: Brain: No acute intracranial abnormality. Specifically, no hemorrhage, hydrocephalus, mass lesion, acute infarction, or significant intracranial injury.   Vascular: No hyperdense vessel or unexpected calcification.   Skull: No acute calvarial abnormality.   Sinuses/Orbits: No acute findings   Other: None   IMPRESSION: No acute intracranial abnormality.    Recent Labs: 05/29/2021: TSH 2.590 06/05/2021: Magnesium 2.0 08/12/2021: ALT 72 08/25/2021: BUN 9; Creatinine, Ser 0.87; Hemoglobin 15.1; Platelets 309; Potassium 3.6; Sodium 140  Recent Lipid Panel    Component Value Date/Time   CHOL 209 (H) 05/29/2021 0840   TRIG 304 (H) 05/29/2021 0840   HDL 38 (L) 05/29/2021 0840   CHOLHDL 5.5 (H) 05/29/2021 0840   LDLCALC 118 (H) 05/29/2021 0840     Physical Exam:    VS:  BP 112/64    Pulse 85    Ht  (1.575 m)    Wt 241 lb 12.8 oz (109.7 kg)    SpO2 98%    BMI 44.23 kg/m     Wt Readings from Last 3 Encounters:  08/28/21 241 lb 12.8 oz (109.7 kg)  08/25/21 240 lb (108.9 kg)  08/12/21 241 lb 6.4 oz (109.5 kg)     GEN: Well nourished, well developed in no acute distress HEENT: Normal NECK: No JVD; No carotid bruits LYMPHATICS: No lymphadenopathy CARDIAC: S1S2 noted,RRR, no murmurs, rubs, gallops RESPIRATORY:  Clear to auscultation without rales, wheezing or rhonchi  ABDOMEN: Soft, non-tender, non-distended, +bowel sounds, no guarding. EXTREMITIES: No edema, No cyanosis, no clubbing MUSCULOSKELETAL:  No deformity  SKIN: Warm and dry NEUROLOGIC:  Alert and oriented x 3, non-focal PSYCHIATRIC:  Normal affect, good insight  ASSESSMENT:    1. Chest pain of uncertain etiology   2. minimal cad   3. Morbid obesity (HCC)   4. Mixed hyperlipidemia    PLAN:     She is experiencing chest pain, with her minimal cad will add Imdur 15 mg daily.   She is waiting to hear back from her neurologist to set up an appointment given her recent symptoms of weakness and headaches.  The patient understands the need to lose weight with diet and exercise. We have discussed specific strategies for this.  She will continue on the Lasix.  The patient is in agreement with the above plan. The patient left the office in stable condition.  The patient will follow up in 6 months or sooner if needed.   Medication Adjustments/Labs and Tests Ordered: Current medicines are reviewed at length with the patient today.  Concerns regarding medicines are outlined above.  No orders of the defined types were placed in this encounter.  Meds ordered this encounter  Medications   isosorbide mononitrate (IMDUR) 30 MG 24 hr tablet    Sig: Take 0.5 tablets (15 mg total) by mouth daily.    Dispense:  45 tablet    Refill:  3    Patient Instructions   Medication Instructions:  Your physician has recommended you make the following change in your medication:  START: Imdur 15 mg once daily *If you need a refill on your cardiac medications before your next appointment, please call your pharmacy*   Lab Work: None If you have labs (blood work) drawn today and your tests are completely normal, you will receive your results only  by: MyChart Message (if you have MyChart) OR A paper copy in the mail If you have any lab test that is abnormal or we need to change your treatment, we will call you to review the results.   Testing/Procedures: None   Follow-Up: At [facility], you and your health needs are our priority.  As part of our continuing mission to provide you with exceptional heart care, we have created designated Provider Care Teams.  These Care Teams include your primary Cardiologist (physician) and Advanced Practice Providers (APPs -  Physician Assistants and Nurse Practitioners) who all work together to provide you with the care you need, when you need it.  We recommend signing up for the patient portal called "MyChart".  Sign up information is provided on this After Visit Summary.  MyChart is used to connect with patients for Virtual Visits (Telemedicine).  Patients are able to view lab/test results, encounter notes, upcoming appointments, etc.  Non-urgent messages can be sent to your provider as well.   To learn more about what you can do with MyChart, go to [email or web address].    Your next appointment:   6 month(s)  The format for your next appointment:   In Person  Provider:   Thomasene Ripple, DO     Other Instructions     Adopting a Healthy Lifestyle.  Know what a healthy weight is for you (roughly BMI <25) and aim to maintain this   Aim for 7+ servings of fruits and vegetables daily   65-80+ fluid ounces of water or unsweet tea for healthy kidneys   Limit to max 1 drink of alcohol per day; avoid  smoking/tobacco   Limit animal fats in diet for cholesterol and heart health - choose grass fed whenever available   Avoid highly processed foods, and foods high in saturated/trans fats   Aim for low stress - take time to unwind and care for your mental health   Aim for 150 min of moderate intensity exercise weekly for heart health, and weights twice weekly for bone health   Aim for 7-9 hours of sleep daily   When it comes to diets, agreement about the perfect plan isnt easy to find, even among the experts. Experts at the [facility] of [employer] developed an idea known as the Healthy Eating Plate. Just imagine a plate divided into logical, healthy portions.   The emphasis is on diet quality:   Load up on vegetables and fruits - one-half of your plate: Aim for color and variety, and remember that potatoes dont count.   Go for whole grains - one-quarter of your plate: Whole wheat, barley, wheat berries, quinoa, oats, brown rice, and foods made with them. If you want pasta, go with whole wheat pasta.   Protein power - one-quarter of your plate: Fish, chicken, beans, and nuts are all healthy, versatile protein sources. Limit red meat.   The diet, however, does go beyond the plate, offering a few other suggestions.   Use healthy plant oils, such as olive, canola, soy, corn, sunflower and peanut. Check the labels, and avoid partially hydrogenated oil, which have unhealthy trans fats.   If youre thirsty, drink water. Coffee and tea are good in moderation, but skip sugary drinks and limit milk and dairy products to one or two daily servings.   The type of carbohydrate in the diet is more important than the amount. Some sources of carbohydrates, such as vegetables, fruits, whole grains, and beans-are healthier than others.  Finally, stay active  Signed, Thomasene Ripple, DO  08/29/2021 11:21 AM    [facility]

## 2021-08-29 DIAGNOSIS — R079 Chest pain, unspecified: Secondary | ICD-10-CM

## 2021-08-29 HISTORY — DX: Chest pain, unspecified: R07.9

## 2021-09-02 ENCOUNTER — Other Ambulatory Visit: Payer: Self-pay | Admitting: Physician Assistant

## 2021-09-02 DIAGNOSIS — E559 Vitamin D deficiency, unspecified: Secondary | ICD-10-CM

## 2021-09-03 ENCOUNTER — Encounter: Payer: Self-pay | Admitting: Physician Assistant

## 2021-09-03 ENCOUNTER — Ambulatory Visit: Payer: 59 | Admitting: Physician Assistant

## 2021-09-03 ENCOUNTER — Other Ambulatory Visit: Payer: Self-pay

## 2021-09-03 VITALS — BP 102/66 | HR 89 | Temp 97.8°F | Resp 18 | Ht 61.0 in | Wt 243.6 lb

## 2021-09-03 DIAGNOSIS — G43809 Other migraine, not intractable, without status migrainosus: Secondary | ICD-10-CM | POA: Diagnosis not present

## 2021-09-03 DIAGNOSIS — R1084 Generalized abdominal pain: Secondary | ICD-10-CM

## 2021-09-03 MED ORDER — DICYCLOMINE HCL 20 MG PO TABS: 20.0000 mg | ORAL_TABLET | Freq: Three times a day (TID) | ORAL | 1 refills | Status: DC

## 2021-09-03 NOTE — Progress Notes (Signed)
Subjective:  Patient ID: Cindy Hammond, female    DOB: [birth year]  Age: 56 y.o. MRN: [id number]  Chief Complaint  Patient presents with   Follow-up    Was in ED 12/25 @ [place]    HPI  Pt was in ED 12/25 for headache and paresthesias - she was diagnosed with probable migraine sequale and was advised to follow up with her neurologist (states she has tried to make appt and no one has called her back) Stroke was ruled out by MRI Her symptoms have resolved today  Pt concerned because she is having some abdominal bloating and discomfort - states bowels have not been regular recently - she did have a CT of abdomen/pelvis on 08/23/21 which showed hepatic steatosis but otherwise normal Of note she had a normal colonoscopy 2/22 Current Outpatient Medications on File Prior to Visit  Medication Sig Dispense Refill   albuterol (VENTOLIN HFA) 108 (90 Base) MCG/ACT inhaler Inhale 1-2 puffs into the lungs every 6 (six) hours as needed for wheezing or shortness of breath.     amitriptyline (ELAVIL) 150 MG tablet Take 1 tablet (150 mg total) by mouth at bedtime. 90 tablet 0   atenolol (TENORMIN) 25 MG tablet Take 1 tablet (25 mg total) by mouth 2 (two) times daily. (Patient taking differently: Take 50 mg by mouth every morning.) 180 tablet 3   atorvastatin (LIPITOR) 40 MG tablet TAKE 1 TABLET BY MOUTH ONCE DAILY (Patient taking differently: Take 40 mg by mouth daily.) 90 tablet 1   benzonatate (TESSALON) 100 MG capsule Take 1 capsule (100 mg total) by mouth 2 (two) times daily as needed for cough. 30 capsule 0   busPIRone (BUSPAR) 30 MG tablet Take 1 tablet (30 mg total) by mouth 2 (two) times daily. 180 tablet 1   clonazePAM (KLONOPIN) 0.5 MG tablet TAKE 1 TABLET BY MOUTH TWICE DAILY. 60 tablet 0   furosemide (LASIX) 40 MG tablet Take 1 tablet (40 mg total) by mouth daily. 90 tablet 3   isosorbide mononitrate (IMDUR) 30 MG 24 hr tablet Take 0.5 tablets (15 mg total) by mouth daily. 45 tablet 3   KRILL  OIL PO Take 1 capsule by mouth 2 (two) times daily.     nitroGLYCERIN (NITROSTAT) 0.4 MG SL tablet PLACE 1 TABLET UNDER THE TONGUE AS NEEDED FOR CHEST PAIN ( MAY REPEAT EVERY5 MINUTES X 3) 25 tablet 3   omeprazole (PRILOSEC) 40 MG capsule Take 1 capsule (40 mg total) by mouth daily. (Patient taking differently: Take 40 mg by mouth daily as needed (acid reflux).) 90 capsule 1   pregabalin (LYRICA) 75 MG capsule TAKE 1 CAPSULE BY MOUTH 3 TIMES DAILY. (Patient taking differently: Take 75 mg by mouth 3 (three) times daily.) 90 capsule 1   QUEtiapine (SEROQUEL) 300 MG tablet TAKE 1 TABLET BY MOUTH DAILY AT BEDTIME. (Patient taking differently: Take 300 mg by mouth at bedtime.) 90 tablet 1   topiramate (TOPAMAX) 100 MG tablet TAKE ONE (1) TABLET BY MOUTH 3 TIMES DAILY (Patient taking differently: Take 100 mg by mouth 2 (two) times daily.) 270 tablet 1   Ubrogepant (UBRELVY) 100 MG TABS Take 100 mg by mouth 2 (two) times daily as needed (migraine).     Vitamin D, Ergocalciferol, (DRISDOL) 1.25 MG (50000 UNIT) CAPS capsule TAKE 1 CAPSULE BY MOUTH TWICE A WEEK 10 capsule 1   No current facility-administered medications on file prior to visit.   Past Medical History:  Diagnosis Date   Anxiety  Depression    GERD (gastroesophageal reflux disease)    Hyperlipidemia    Hypertension    Pain in joint, lower leg    Thrombocytopenia (HCC)    Past Surgical History:  Procedure Laterality Date   TUBAL LIGATION      Family History  Problem Relation Age of Onset   Hyperlipidemia Mother    Hypertension Mother    Hyperlipidemia Father    Hypertension Father    Social History   Socioeconomic History   Marital status: Married    Spouse name: Not on file   Number of children: 3   Years of education: Not on file   Highest education level: Not on file  Occupational History   Occupation: Unemployed  Tobacco Use   Smoking status: Former    Packs/day: 0.50    Years: 10.00    Pack years: 5.00     Types: Cigarettes    Quit date: 2000    Years since quitting: 23.0   Smokeless tobacco: Never  Vaping Use   Vaping Use: Never used  Substance and Sexual Activity   Alcohol use: No    Alcohol/week: 0.0 standard drinks   Drug use: No   Sexual activity: Yes    Partners: Male  Other Topics Concern   Not on file  Social History Narrative   Not on file   Social Determinants of Health   Financial Resource Strain: Not on file  Food Insecurity: Not on file  Transportation Needs: Not on file  Physical Activity: Not on file  Stress: Not on file  Social Connections: Not on file    Review of Systems CONSTITUTIONAL: Negative for chills, fatigue, fever, unintentional weight gain and unintentional weight loss.  E/N/T: Negative for ear pain, nasal congestion and sore throat.  CARDIOVASCULAR: Negative for chest pain, dizziness, palpitations and pedal edema.  RESPIRATORY: Negative for recent cough and dyspnea.  GASTROINTESTINAL: see HPI MSK: Negative for arthralgias and myalgias.  INTEGUMENTARY: Negative for rash.  NEUROLOGICAL: see HPI PSYCHIATRIC: Negative for sleep disturbance and to question depression screen.  Negative for depression, negative for anhedonia.       Objective:  BP 102/66    Pulse 89    Temp 97.8 F (36.6 C)    Resp 18    Ht 5\' 1"  (1.549 m)    Wt 243 lb 9.6 oz (110.5 kg)    SpO2 97%    BMI 46.03 kg/m   BP/Weight 09/03/2021 08/28/2021 08/25/2021  Systolic BP 102 112 109  Diastolic BP 66 64 72  Wt. (Lbs) 243.6 241.8 240  BMI 46.03 44.23 45.35    Physical Exam PHYSICAL EXAM:   VS: BP 102/66    Pulse 89    Temp 97.8 F (36.6 C)    Resp 18    Ht 5\' 1"  (1.549 m)    Wt 243 lb 9.6 oz (110.5 kg)    SpO2 97%    BMI 46.03 kg/m   GEN: Well nourished, well developed, in no acute distress  Cardiac: RRR; no murmurs, rubs, or gallops,no edema - Respiratory:  normal respiratory rate and pattern with no distress - normal breath sounds with no rales, rhonchi, wheezes or  rubs GI: normal bowel sounds, no masses - mild generalized tenderness Psych: euthymic mood, appropriate affect and demeanor  Diabetic Foot Exam - Simple   No data filed      Lab Results  Component Value Date   WBC 7.1 08/25/2021   HGB 15.1 (H) 08/25/2021  HCT 45.2 08/25/2021   PLT 309 08/25/2021   GLUCOSE 120 (H) 08/25/2021   CHOL 209 (H) 05/29/2021   TRIG 304 (H) 05/29/2021   HDL 38 (L) 05/29/2021   LDLCALC 118 (H) 05/29/2021   ALT 72 (H) 08/12/2021   AST 63 (H) 08/12/2021   NA 140 08/25/2021   K 3.6 08/25/2021   CL 107 08/25/2021   CREATININE 0.87 08/25/2021   BUN 9 08/25/2021   CO2 24 08/25/2021   TSH 2.590 05/29/2021   HGBA1C 6.1 (H) 02/18/2021      Assessment & Plan:   Problem List Items Addressed This Visit       Cardiovascular and Mediastinum   Migraine Continue current meds and will reach out for pt to get neurology follow up appt   Other Visit Diagnoses     Generalized abdominal pain    -  Primary   Relevant Medications   dicyclomine (BENTYL) 20 MG tablet     .  Meds ordered this encounter  Medications   dicyclomine (BENTYL) 20 MG tablet    Sig: Take 1 tablet (20 mg total) by mouth 3 (three) times daily before meals.    Dispense:  90 tablet    Refill:  1    Order Specific Question:   Supervising Provider    AnswerCorey Harold    Orders Placed This Encounter  Procedures   CBC with Differential/Platelet   Comprehensive metabolic panel     Follow-up: Return in about 4 weeks (around 10/01/2021) for follow up.  An After Visit Summary was printed and given to the patient.  Jettie Pagan [facility] [phone]

## 2021-09-04 ENCOUNTER — Other Ambulatory Visit: Payer: Self-pay

## 2021-09-04 DIAGNOSIS — R198 Other specified symptoms and signs involving the digestive system and abdomen: Secondary | ICD-10-CM

## 2021-09-04 DIAGNOSIS — R1084 Generalized abdominal pain: Secondary | ICD-10-CM

## 2021-09-04 LAB — CBC WITH DIFFERENTIAL/PLATELET
Basophils Absolute: 0.1 10*3/uL (ref 0.0–0.2)
Basos: 1 %
EOS (ABSOLUTE): 0.3 10*3/uL (ref 0.0–0.4)
Eos: 3 %
Hematocrit: 43.5 % (ref 34.0–46.6)
Hemoglobin: 14.9 g/dL (ref 11.1–15.9)
Immature Grans (Abs): 0.1 10*3/uL (ref 0.0–0.1)
Immature Granulocytes: 1 %
Lymphocytes Absolute: 2.5 10*3/uL (ref 0.7–3.1)
Lymphs: 24 %
MCH: 31.8 pg (ref 26.6–33.0)
MCHC: 34.3 g/dL (ref 31.5–35.7)
MCV: 93 fL (ref 79–97)
Monocytes Absolute: 1.1 10*3/uL — ABNORMAL HIGH (ref 0.1–0.9)
Monocytes: 10 %
Neutrophils Absolute: 6.3 10*3/uL (ref 1.4–7.0)
Neutrophils: 61 %
Platelets: 336 10*3/uL (ref 150–450)
RBC: 4.68 x10E6/uL (ref 3.77–5.28)
RDW: 12.9 % (ref 11.7–15.4)
WBC: 10.4 10*3/uL (ref 3.4–10.8)

## 2021-09-04 LAB — COMPREHENSIVE METABOLIC PANEL
ALT: 52 IU/L — ABNORMAL HIGH (ref 0–32)
AST: 42 IU/L — ABNORMAL HIGH (ref 0–40)
Albumin/Globulin Ratio: 1.4 (ref 1.2–2.2)
Albumin: 4.1 g/dL (ref 3.8–4.9)
Alkaline Phosphatase: 184 IU/L — ABNORMAL HIGH (ref 44–121)
BUN/Creatinine Ratio: 13 (ref 9–23)
BUN: 11 mg/dL (ref 6–24)
Bilirubin Total: 0.3 mg/dL (ref 0.0–1.2)
CO2: 23 mmol/L (ref 20–29)
Calcium: 8.7 mg/dL (ref 8.7–10.2)
Chloride: 104 mmol/L (ref 96–106)
Creatinine, Ser: 0.84 mg/dL (ref 0.57–1.00)
Globulin, Total: 3 g/dL (ref 1.5–4.5)
Glucose: 96 mg/dL (ref 70–99)
Potassium: 3.7 mmol/L (ref 3.5–5.2)
Sodium: 140 mmol/L (ref 134–144)
Total Protein: 7.1 g/dL (ref 6.0–8.5)
eGFR: 83 mL/min/{1.73_m2} (ref >59)

## 2021-09-05 ENCOUNTER — Ambulatory Visit: Payer: Managed Care, Other (non HMO) | Admitting: Physician Assistant

## 2021-09-19 DIAGNOSIS — G43109 Migraine with aura, not intractable, without status migrainosus: Secondary | ICD-10-CM | POA: Diagnosis not present

## 2021-09-19 DIAGNOSIS — Z79899 Other long term (current) drug therapy: Secondary | ICD-10-CM | POA: Diagnosis not present

## 2021-09-19 DIAGNOSIS — G43009 Migraine without aura, not intractable, without status migrainosus: Secondary | ICD-10-CM | POA: Diagnosis not present

## 2021-09-19 DIAGNOSIS — R29898 Other symptoms and signs involving the musculoskeletal system: Secondary | ICD-10-CM | POA: Diagnosis not present

## 2021-09-20 ENCOUNTER — Encounter: Payer: Self-pay | Admitting: Cardiology

## 2021-09-21 ENCOUNTER — Other Ambulatory Visit: Payer: Self-pay | Admitting: Family Medicine

## 2021-09-21 DIAGNOSIS — F419 Anxiety disorder, unspecified: Secondary | ICD-10-CM

## 2021-09-23 ENCOUNTER — Other Ambulatory Visit: Payer: Self-pay | Admitting: Physician Assistant

## 2021-09-23 MED ORDER — RANOLAZINE ER 500 MG PO TB12: 500.0000 mg | ORAL_TABLET | Freq: Two times a day (BID) | ORAL | 11 refills | Status: DC

## 2021-09-30 ENCOUNTER — Other Ambulatory Visit: Payer: Self-pay | Admitting: Family Medicine

## 2021-09-30 ENCOUNTER — Other Ambulatory Visit: Payer: Self-pay | Admitting: Physician Assistant

## 2021-09-30 DIAGNOSIS — F419 Anxiety disorder, unspecified: Secondary | ICD-10-CM

## 2021-10-01 ENCOUNTER — Ambulatory Visit: Payer: 59 | Admitting: Physician Assistant

## 2021-10-01 ENCOUNTER — Other Ambulatory Visit: Payer: Self-pay | Admitting: Physician Assistant

## 2021-10-01 ENCOUNTER — Encounter: Payer: Self-pay | Admitting: Physician Assistant

## 2021-10-01 ENCOUNTER — Other Ambulatory Visit: Payer: Self-pay

## 2021-10-01 VITALS — BP 116/80 | HR 88 | Temp 98.2°F | Ht 61.0 in | Wt 243.8 lb

## 2021-10-01 DIAGNOSIS — R002 Palpitations: Secondary | ICD-10-CM | POA: Diagnosis not present

## 2021-10-01 DIAGNOSIS — R1084 Generalized abdominal pain: Secondary | ICD-10-CM | POA: Diagnosis not present

## 2021-10-01 DIAGNOSIS — G43809 Other migraine, not intractable, without status migrainosus: Secondary | ICD-10-CM

## 2021-10-01 DIAGNOSIS — F419 Anxiety disorder, unspecified: Secondary | ICD-10-CM

## 2021-10-01 MED ORDER — ALBUTEROL SULFATE HFA 108 (90 BASE) MCG/ACT IN AERS: 1.0000 | INHALATION_SPRAY | Freq: Four times a day (QID) | RESPIRATORY_TRACT | 2 refills | Status: DC | PRN

## 2021-10-01 MED ORDER — CLONAZEPAM 0.5 MG PO TABS: 0.5000 mg | ORAL_TABLET | Freq: Two times a day (BID) | ORAL | 0 refills | Status: DC

## 2021-10-01 MED ORDER — QUETIAPINE FUMARATE 300 MG PO TABS: 300.0000 mg | ORAL_TABLET | Freq: Every day | ORAL | 1 refills | Status: DC

## 2021-10-01 NOTE — Progress Notes (Signed)
Subjective:  Patient ID: Cindy Hammond, female    DOB: [birth year]  Age: 56 y.o. MRN: [id number]  Chief Complaint  Patient presents with   Abdominal Pain   Headache    HPI  Pt states that the abdominal pain and bloating she had been having improved greatly with bentyl - is not currently having any issues  Pt was having trouble with migraines however she had spoken with her cardiologist and switched from imdur to ranexa and says her headaches have improved  Pt gives history today of having a few spells where she feels tingling on the right side of her chest and her heart rate seems fast - can happen at times of rest or with activity - her last episode was about 2-3 weeks ago - states it only lasts less than a minute Denies other chest pain or dyspnea symptoms She does follow regularly with cardiology and last appt was in December Current Outpatient Medications on File Prior to Visit  Medication Sig Dispense Refill   amitriptyline (ELAVIL) 150 MG tablet TAKE 1 TABLET BY MOUTH DAILY AT BEDTIME. 90 tablet 1   atenolol (TENORMIN) 25 MG tablet Take 1 tablet (25 mg total) by mouth 2 (two) times daily. (Patient taking differently: Take 50 mg by mouth every morning.) 180 tablet 3   atorvastatin (LIPITOR) 40 MG tablet TAKE 1 TABLET BY MOUTH ONCE DAILY (Patient taking differently: Take 40 mg by mouth daily.) 90 tablet 1   benzonatate (TESSALON) 100 MG capsule Take 1 capsule (100 mg total) by mouth 2 (two) times daily as needed for cough. 30 capsule 0   busPIRone (BUSPAR) 30 MG tablet Take 1 tablet (30 mg total) by mouth 2 (two) times daily. 180 tablet 1   dicyclomine (BENTYL) 20 MG tablet Take 1 tablet (20 mg total) by mouth 3 (three) times daily before meals. 90 tablet 1   furosemide (LASIX) 40 MG tablet Take 1 tablet (40 mg total) by mouth daily. 90 tablet 3   KRILL OIL PO Take 1 capsule by mouth 2 (two) times daily.     nitroGLYCERIN (NITROSTAT) 0.4 MG SL tablet PLACE 1 TABLET UNDER THE TONGUE AS  NEEDED FOR CHEST PAIN ( MAY REPEAT EVERY5 MINUTES X 3) 25 tablet 3   omeprazole (PRILOSEC) 40 MG capsule Take 1 capsule (40 mg total) by mouth daily. (Patient taking differently: Take 40 mg by mouth daily as needed (acid reflux).) 90 capsule 1   pregabalin (LYRICA) 75 MG capsule TAKE 1 CAPSULE BY MOUTH 3 TIMES DAILY. 90 capsule 0   ranolazine (RANEXA) 500 MG 12 hr tablet Take 1 tablet (500 mg total) by mouth 2 (two) times daily. 60 tablet 11   topiramate (TOPAMAX) 100 MG tablet TAKE ONE (1) TABLET BY MOUTH 3 TIMES DAILY (Patient taking differently: Take 100 mg by mouth 2 (two) times daily.) 270 tablet 1   Ubrogepant (UBRELVY) 100 MG TABS Take 100 mg by mouth 2 (two) times daily as needed (migraine).     Vitamin D, Ergocalciferol, (DRISDOL) 1.25 MG (50000 UNIT) CAPS capsule TAKE 1 CAPSULE BY MOUTH TWICE A WEEK 10 capsule 1   No current facility-administered medications on file prior to visit.   Past Medical History:  Diagnosis Date   Anxiety    Depression    GERD (gastroesophageal reflux disease)    Hyperlipidemia    Hypertension    Pain in joint, lower leg    Thrombocytopenia (HCC)    Past Surgical History:  Procedure Laterality Date  TUBAL LIGATION      Family History  Problem Relation Age of Onset   Hyperlipidemia Mother    Hypertension Mother    Hyperlipidemia Father    Hypertension Father    Social History   Socioeconomic History   Marital status: Married    Spouse name: Not on file   Number of children: 3   Years of education: Not on file   Highest education level: Not on file  Occupational History   Occupation: Unemployed  Tobacco Use   Smoking status: Former    Packs/day: 0.50    Years: 10.00    Pack years: 5.00    Types: Cigarettes    Quit date: 2000    Years since quitting: 23.0   Smokeless tobacco: Never  Vaping Use   Vaping Use: Never used  Substance and Sexual Activity   Alcohol use: No    Alcohol/week: 0.0 standard drinks   Drug use: No   Sexual  activity: Yes    Partners: Male  Other Topics Concern   Not on file  Social History Narrative   Not on file   Social Determinants of Health   Financial Resource Strain: Not on file  Food Insecurity: Not on file  Transportation Needs: Not on file  Physical Activity: Not on file  Stress: Not on file  Social Connections: Not on file    Review of Systems CONSTITUTIONAL: Negative for chills, fatigue, fever, unintentional weight gain and unintentional weight loss.  E/N/T: Negative for ear pain, nasal congestion and sore throat.  CARDIOVASCULAR: see HPI RESPIRATORY: Negative for recent cough and dyspnea.  GASTROINTESTINAL: Negative for abdominal pain, acid reflux symptoms, constipation, diarrhea, nausea and vomiting.     Objective:  PHYSICAL EXAM:   VS: BP 116/80    Pulse 88    Temp 98.2 F (36.8 C)    Ht 5\' 1"  (1.549 m)    Wt 243 lb 12.8 oz (110.6 kg)    SpO2 98%    BMI 46.07 kg/m   GEN: Well nourished, well developed, in no acute distress  Cardiac: RRR; no murmurs, rubs, or gallops,no edema -  Respiratory:  normal respiratory rate and pattern with no distress - normal breath sounds with no rales, rhonchi, wheezes or rubs Skin: warm and dry, no rash  Psych: euthymic mood, appropriate affect and demeanor  EKG - no acute changes noted Lab Results  Component Value Date   WBC 10.4 09/03/2021   HGB 14.9 09/03/2021   HCT 43.5 09/03/2021   PLT 336 09/03/2021   GLUCOSE 96 09/03/2021   CHOL 209 (H) 05/29/2021   TRIG 304 (H) 05/29/2021   HDL 38 (L) 05/29/2021   LDLCALC 118 (H) 05/29/2021   ALT 52 (H) 09/03/2021   AST 42 (H) 09/03/2021   NA 140 09/03/2021   K 3.7 09/03/2021   CL 104 09/03/2021   CREATININE 0.84 09/03/2021   BUN 11 09/03/2021   CO2 23 09/03/2021   TSH 2.590 05/29/2021   HGBA1C 6.1 (H) 02/18/2021      Assessment & Plan:   Problem List Items Addressed This Visit       Cardiovascular and Mediastinum   Migraine Continue current meds   Other Visit  Diagnoses     Generalized abdominal pain    -  Primary - resolved Continue bentyl as directed   Palpitations       Relevant Orders   EKG 12-Lead Follow up with cardiology     .  No orders  of the defined types were placed in this encounter.   Orders Placed This Encounter  Procedures   CBC with Differential/Platelet   Comprehensive metabolic panel   TSH   EKG 12-Lead     Follow-up: Return in about 4 weeks (around 10/29/2021) for chronic fasting follow up.  An After Visit Summary was printed and given to the patient.  Jettie Pagan [facility] [phone]

## 2021-10-02 LAB — COMPREHENSIVE METABOLIC PANEL
ALT: 46 IU/L — ABNORMAL HIGH (ref 0–32)
AST: 42 IU/L — ABNORMAL HIGH (ref 0–40)
Albumin/Globulin Ratio: 1.3 (ref 1.2–2.2)
Albumin: 4.1 g/dL (ref 3.8–4.9)
Alkaline Phosphatase: 169 IU/L — ABNORMAL HIGH (ref 44–121)
BUN/Creatinine Ratio: 10 (ref 9–23)
BUN: 9 mg/dL (ref 6–24)
Bilirubin Total: 0.3 mg/dL (ref 0.0–1.2)
CO2: 21 mmol/L (ref 20–29)
Calcium: 9.3 mg/dL (ref 8.7–10.2)
Chloride: 105 mmol/L (ref 96–106)
Creatinine, Ser: 0.86 mg/dL (ref 0.57–1.00)
Globulin, Total: 3.1 g/dL (ref 1.5–4.5)
Glucose: 96 mg/dL (ref 70–99)
Potassium: 4.1 mmol/L (ref 3.5–5.2)
Sodium: 141 mmol/L (ref 134–144)
Total Protein: 7.2 g/dL (ref 6.0–8.5)
eGFR: 80 mL/min/{1.73_m2} (ref >59)

## 2021-10-02 LAB — CBC WITH DIFFERENTIAL/PLATELET
Basophils Absolute: 0.1 10*3/uL (ref 0.0–0.2)
Basos: 1 %
EOS (ABSOLUTE): 0.3 10*3/uL (ref 0.0–0.4)
Eos: 4 %
Hematocrit: 45.8 % (ref 34.0–46.6)
Hemoglobin: 15.5 g/dL (ref 11.1–15.9)
Immature Grans (Abs): 0 10*3/uL (ref 0.0–0.1)
Immature Granulocytes: 1 %
Lymphocytes Absolute: 2.1 10*3/uL (ref 0.7–3.1)
Lymphs: 26 %
MCH: 31.9 pg (ref 26.6–33.0)
MCHC: 33.8 g/dL (ref 31.5–35.7)
MCV: 94 fL (ref 79–97)
Monocytes Absolute: 0.7 10*3/uL (ref 0.1–0.9)
Monocytes: 8 %
Neutrophils Absolute: 4.9 10*3/uL (ref 1.4–7.0)
Neutrophils: 60 %
Platelets: 314 10*3/uL (ref 150–450)
RBC: 4.86 x10E6/uL (ref 3.77–5.28)
RDW: 12.8 % (ref 11.7–15.4)
WBC: 8.1 10*3/uL (ref 3.4–10.8)

## 2021-10-02 LAB — TSH: TSH: 1.56 u[IU]/mL (ref 0.450–4.500)

## 2021-10-07 ENCOUNTER — Ambulatory Visit (INDEPENDENT_AMBULATORY_CARE_PROVIDER_SITE_OTHER): Payer: 59

## 2021-10-07 ENCOUNTER — Telehealth: Payer: Self-pay

## 2021-10-07 DIAGNOSIS — R002 Palpitations: Secondary | ICD-10-CM

## 2021-10-07 NOTE — Telephone Encounter (Signed)
Called pt to let her know we ordered the monitor and it will be sent to the address we have on file. She verbalized understanding.

## 2021-10-07 NOTE — Progress Notes (Unsigned)
Enrolled patient for a 14 day Zio XT  monitor to be mailed to patients home  °

## 2021-10-07 NOTE — Telephone Encounter (Signed)
While Dr. Servando Salina was speaking to pt's husband she made her aware of experiencing a fast heart rate. Dr. Servando Salina let pt know we will be sending a Zio monitor to the address on file. Order placed.

## 2021-10-10 DIAGNOSIS — R002 Palpitations: Secondary | ICD-10-CM

## 2021-10-17 DIAGNOSIS — R002 Palpitations: Secondary | ICD-10-CM | POA: Diagnosis not present

## 2021-10-30 ENCOUNTER — Other Ambulatory Visit: Payer: Self-pay | Admitting: Physician Assistant

## 2021-10-31 ENCOUNTER — Ambulatory Visit: Payer: 59 | Admitting: Physician Assistant

## 2021-11-07 ENCOUNTER — Ambulatory Visit: Payer: 59 | Admitting: Physician Assistant

## 2021-11-07 ENCOUNTER — Other Ambulatory Visit: Payer: Self-pay

## 2021-11-07 ENCOUNTER — Encounter: Payer: Self-pay | Admitting: Physician Assistant

## 2021-11-07 VITALS — BP 116/68 | HR 93 | Temp 97.4°F | Ht 61.0 in | Wt 241.4 lb

## 2021-11-07 DIAGNOSIS — G43809 Other migraine, not intractable, without status migrainosus: Secondary | ICD-10-CM | POA: Diagnosis not present

## 2021-11-07 DIAGNOSIS — I10 Essential (primary) hypertension: Secondary | ICD-10-CM

## 2021-11-07 DIAGNOSIS — R69 Illness, unspecified: Secondary | ICD-10-CM | POA: Diagnosis not present

## 2021-11-07 DIAGNOSIS — E782 Mixed hyperlipidemia: Secondary | ICD-10-CM

## 2021-11-07 DIAGNOSIS — M797 Fibromyalgia: Secondary | ICD-10-CM

## 2021-11-07 DIAGNOSIS — R7303 Prediabetes: Secondary | ICD-10-CM | POA: Diagnosis not present

## 2021-11-07 DIAGNOSIS — E559 Vitamin D deficiency, unspecified: Secondary | ICD-10-CM

## 2021-11-07 DIAGNOSIS — F419 Anxiety disorder, unspecified: Secondary | ICD-10-CM

## 2021-11-07 NOTE — Progress Notes (Signed)
Established Patient Office Visit  Subjective:  Patient ID: Cindy Hammond, female    DOB: [birth year]  Age: 56 y.o. MRN: [id number]  CC:  Chief Complaint  Patient presents with   Hyperlipidemia   Hypertension    HPI Cindy Hammond presents for follow up hyperlipidemia and chronic issues  Mixed hyperlipidemia  Pt presents with hyperlipidemia.  Compliance with treatment has been good; The patient is compliant with medications, maintains a low cholesterol diet , follows up as directed , . The patient denies experiencing any hypercholesterolemia related symptoms.  Evidenced based information based on history , exam, and other sources has been used  for decision making. She is currently taking lipitor 4m qd  Pt presents for follow up of hypertension.  The patient is tolerating the medication well without side effects. Compliance with treatment has been good; including taking medication as directed ,    She is currently on atenolol 25 mg qd and lasix 454mqd- she denies chest pain or shortness of breath  Pt with history of GERD - stable on omeprazole 405md  Pt with history of anxiety - states her symptoms controlled well with clonazepam, amitriptyline,seroquel and buspar - voices no problems or concerns except that she is concerned about her husband who is having some health problems  Pt currently taking Vit D weekly - due for labwork  Pt with history of migraines - currently stable on topamax and ubrelvy    Past Medical History:  Diagnosis Date   Anxiety    Depression    GERD (gastroesophageal reflux disease)    Hyperlipidemia    Hypertension    Pain in joint, lower leg    Thrombocytopenia (HCC)     Past Surgical History:  Procedure Laterality Date   TUBAL LIGATION      Family History  Problem Relation Age of Onset   Hyperlipidemia Mother    Hypertension Mother    Hyperlipidemia Father    Hypertension Father     Social History   Socioeconomic History   Marital  status: Married    Spouse name: Not on file   Number of children: 3   Years of education: Not on file   Highest education level: Not on file  Occupational History   Occupation: Unemployed  Tobacco Use   Smoking status: Former    Packs/day: 0.50    Years: 10.00    Pack years: 5.00    Types: Cigarettes    Quit date: 2000    Years since quitting: 23.2   Smokeless tobacco: Never  Vaping Use   Vaping Use: Never used  Substance and Sexual Activity   Alcohol use: No    Alcohol/week: 0.0 standard drinks   Drug use: No   Sexual activity: Yes    Partners: Male  Other Topics Concern   Not on file  Social History Narrative   Not on file   Social Determinants of Health   Financial Resource Strain: Not on file  Food Insecurity: Not on file  Transportation Needs: Not on file  Physical Activity: Not on file  Stress: Not on file  Social Connections: Not on file  Intimate Partner Violence: Not on file     Current Outpatient Medications:    albuterol (VENTOLIN HFA) 108 (90 Base) MCG/ACT inhaler, Inhale 1-2 puffs into the lungs every 6 (six) hours as needed for wheezing or shortness of breath., Disp: 1 each, Rfl: 2   amitriptyline (ELAVIL) 150 MG tablet, TAKE 1 TABLET BY  MOUTH DAILY AT BEDTIME., Disp: 90 tablet, Rfl: 1   atorvastatin (LIPITOR) 40 MG tablet, TAKE 1 TABLET BY MOUTH ONCE DAILY (Patient taking differently: Take 40 mg by mouth daily.), Disp: 90 tablet, Rfl: 1   benzonatate (TESSALON) 100 MG capsule, Take 1 capsule (100 mg total) by mouth 2 (two) times daily as needed for cough., Disp: 30 capsule, Rfl: 0   busPIRone (BUSPAR) 30 MG tablet, Take 1 tablet (30 mg total) by mouth 2 (two) times daily., Disp: 180 tablet, Rfl: 1   clonazePAM (KLONOPIN) 0.5 MG tablet, Take 1 tablet (0.5 mg total) by mouth 2 (two) times daily., Disp: 60 tablet, Rfl: 0   dicyclomine (BENTYL) 20 MG tablet, Take 1 tablet (20 mg total) by mouth 3 (three) times daily before meals., Disp: 90 tablet, Rfl: 1    KRILL OIL PO, Take 1 capsule by mouth 2 (two) times daily., Disp: , Rfl:    nitroGLYCERIN (NITROSTAT) 0.4 MG SL tablet, PLACE 1 TABLET UNDER THE TONGUE AS NEEDED FOR CHEST PAIN ( MAY REPEAT EVERY5 MINUTES X 3), Disp: 25 tablet, Rfl: 3   omeprazole (PRILOSEC) 40 MG capsule, Take 1 capsule (40 mg total) by mouth daily. (Patient taking differently: Take 40 mg by mouth daily as needed (acid reflux).), Disp: 90 capsule, Rfl: 1   pregabalin (LYRICA) 75 MG capsule, TAKE 1 CAPSULE BY MOUTH 3 TIMES DAILY., Disp: 90 capsule, Rfl: 0   QUEtiapine (SEROQUEL) 300 MG tablet, Take 1 tablet (300 mg total) by mouth at bedtime., Disp: 90 tablet, Rfl: 1   ranolazine (RANEXA) 500 MG 12 hr tablet, Take 1 tablet (500 mg total) by mouth 2 (two) times daily., Disp: 60 tablet, Rfl: 11   topiramate (TOPAMAX) 100 MG tablet, TAKE ONE (1) TABLET BY MOUTH 3 TIMES DAILY (Patient taking differently: Take 100 mg by mouth 2 (two) times daily.), Disp: 270 tablet, Rfl: 1   Ubrogepant (UBRELVY) 100 MG TABS, Take 100 mg by mouth 2 (two) times daily as needed (migraine)., Disp: , Rfl:    Vitamin D, Ergocalciferol, (DRISDOL) 1.25 MG (50000 UNIT) CAPS capsule, TAKE 1 CAPSULE BY MOUTH TWICE A WEEK, Disp: 10 capsule, Rfl: 1   atenolol (TENORMIN) 25 MG tablet, Take 1 tablet (25 mg total) by mouth 2 (two) times daily. (Patient taking differently: Take 50 mg by mouth every morning.), Disp: 180 tablet, Rfl: 3   furosemide (LASIX) 40 MG tablet, Take 1 tablet (40 mg total) by mouth daily., Disp: 90 tablet, Rfl: 3   Allergies  Allergen Reactions   Sumatriptan Hives and Rash   Sulfa Antibiotics    CONSTITUTIONAL: Negative for chills, fatigue, fever, unintentional weight gain and unintentional weight loss.  E/N/T: Negative for ear pain, nasal congestion and sore throat.  CARDIOVASCULAR: Negative for chest pain, dizziness, palpitations and pedal edema.  RESPIRATORY: Negative for recent cough and dyspnea.  GASTROINTESTINAL: Negative for abdominal  pain, acid reflux symptoms, constipation, diarrhea, nausea and vomiting.  MSK: Negative for arthralgias and myalgias.  INTEGUMENTARY: Negative for rash.  NEUROLOGICAL: Negative for dizziness and headaches.  PSYCHIATRIC: Negative for sleep disturbance and to question depression screen.  Negative for depression, negative for anhedonia.         Objective:  PHYSICAL EXAM:   VS: BP 116/68    Pulse 93    Temp (!) 97.4 F (36.3 C)    Ht 5' 1" (1.549 m)    Wt 241 lb 6.4 oz (109.5 kg)    SpO2 95%    BMI 45.61  kg/m   GEN: Well nourished, well developed, in no acute distress  Cardiac: RRR; no murmurs, rubs, or gallops,no edema -  Respiratory:  normal respiratory rate and pattern with no distress - normal breath sounds with no rales, rhonchi, wheezes or rubs MS: no deformity or atrophy  Skin: warm and dry, no rash  Psych: euthymic mood, appropriate affect and demeanor     Health Maintenance Due  Topic Date Due   PAP SMEAR-Modifier  Never done    There are no preventive care reminders to display for this patient.  Lab Results  Component Value Date   TSH 1.560 10/01/2021   Lab Results  Component Value Date   WBC 8.1 10/01/2021   HGB 15.5 10/01/2021   HCT 45.8 10/01/2021   MCV 94 10/01/2021   PLT 314 10/01/2021   Lab Results  Component Value Date   NA 141 10/01/2021   K 4.1 10/01/2021   CO2 21 10/01/2021   GLUCOSE 96 10/01/2021   BUN 9 10/01/2021   CREATININE 0.86 10/01/2021   BILITOT 0.3 10/01/2021   ALKPHOS 169 (H) 10/01/2021   AST 42 (H) 10/01/2021   ALT 46 (H) 10/01/2021   PROT 7.2 10/01/2021   ALBUMIN 4.1 10/01/2021   CALCIUM 9.3 10/01/2021   ANIONGAP 9 08/25/2021   EGFR 80 10/01/2021   Lab Results  Component Value Date   CHOL 209 (H) 05/29/2021   Lab Results  Component Value Date   HDL 38 (L) 05/29/2021   Lab Results  Component Value Date   LDLCALC 118 (H) 05/29/2021   Lab Results  Component Value Date   TRIG 304 (H) 05/29/2021   Lab Results   Component Value Date   CHOLHDL 5.5 (H) 05/29/2021   Lab Results  Component Value Date   HGBA1C 6.1 (H) 02/18/2021      Assessment & Plan:   Problem List Items Addressed This Visit       Cardiovascular and Mediastinum   Benign hypertension - Primary   Relevant Orders   CBC with Differential/Platelet   Comprehensive metabolic panel   TSH Continue meds     Digestive   Gastroesophageal reflux disease without esophagitis Continue meds as directed     Other   Anxiety Continue meds as directed    Vitamin D deficiency   Relevant Orders   VITAMIN D 25 Hydroxy (Vit-D Deficiency, Fractures)   Mixed hyperlipidemia   Relevant Orders   Lipid panel Continue meds Watch diet         Follow-up: Return in about 6 months (around 05/10/2022) for chroinc fasting follow up.    SARA R Shalawn Wynder, PA-C

## 2021-11-08 LAB — CBC WITH DIFFERENTIAL/PLATELET
Basophils Absolute: 0.1 10*3/uL (ref 0.0–0.2)
Basos: 1 %
EOS (ABSOLUTE): 0.3 10*3/uL (ref 0.0–0.4)
Eos: 4 %
Hematocrit: 46.8 % — ABNORMAL HIGH (ref 34.0–46.6)
Hemoglobin: 15.9 g/dL (ref 11.1–15.9)
Immature Grans (Abs): 0 10*3/uL (ref 0.0–0.1)
Immature Granulocytes: 0 %
Lymphocytes Absolute: 1.7 10*3/uL (ref 0.7–3.1)
Lymphs: 26 %
MCH: 31.6 pg (ref 26.6–33.0)
MCHC: 34 g/dL (ref 31.5–35.7)
MCV: 93 fL (ref 79–97)
Monocytes Absolute: 0.8 10*3/uL (ref 0.1–0.9)
Monocytes: 11 %
Neutrophils Absolute: 3.9 10*3/uL (ref 1.4–7.0)
Neutrophils: 58 %
Platelets: 319 10*3/uL (ref 150–450)
RBC: 5.03 x10E6/uL (ref 3.77–5.28)
RDW: 12.8 % (ref 11.7–15.4)
WBC: 6.6 10*3/uL (ref 3.4–10.8)

## 2021-11-08 LAB — LIPID PANEL
Chol/HDL Ratio: 5.1 ratio — ABNORMAL HIGH (ref 0.0–4.4)
Cholesterol, Total: 202 mg/dL — ABNORMAL HIGH (ref 100–199)
HDL: 40 mg/dL (ref >39)
LDL Chol Calc (NIH): 122 mg/dL — ABNORMAL HIGH (ref 0–99)
Triglycerides: 228 mg/dL — ABNORMAL HIGH (ref 0–149)
VLDL Cholesterol Cal: 40 mg/dL (ref 5–40)

## 2021-11-08 LAB — COMPREHENSIVE METABOLIC PANEL
ALT: 32 IU/L (ref 0–32)
AST: 28 IU/L (ref 0–40)
Albumin/Globulin Ratio: 1.5 (ref 1.2–2.2)
Albumin: 4.3 g/dL (ref 3.8–4.9)
Alkaline Phosphatase: 172 IU/L — ABNORMAL HIGH (ref 44–121)
BUN/Creatinine Ratio: 14 (ref 9–23)
BUN: 14 mg/dL (ref 6–24)
Bilirubin Total: 0.3 mg/dL (ref 0.0–1.2)
CO2: 23 mmol/L (ref 20–29)
Calcium: 9.1 mg/dL (ref 8.7–10.2)
Chloride: 104 mmol/L (ref 96–106)
Creatinine, Ser: 1.01 mg/dL — ABNORMAL HIGH (ref 0.57–1.00)
Globulin, Total: 2.9 g/dL (ref 1.5–4.5)
Glucose: 117 mg/dL — ABNORMAL HIGH (ref 70–99)
Potassium: 3.8 mmol/L (ref 3.5–5.2)
Sodium: 142 mmol/L (ref 134–144)
Total Protein: 7.2 g/dL (ref 6.0–8.5)
eGFR: 66 mL/min/{1.73_m2} (ref >59)

## 2021-11-08 LAB — HEMOGLOBIN A1C
Est. average glucose Bld gHb Est-mCnc: 123 mg/dL
Hgb A1c MFr Bld: 5.9 % — ABNORMAL HIGH (ref 4.8–5.6)

## 2021-11-08 LAB — VITAMIN D 25 HYDROXY (VIT D DEFICIENCY, FRACTURES): Vit D, 25-Hydroxy: 60.8 ng/mL (ref 30.0–100.0)

## 2021-11-08 LAB — CARDIOVASCULAR RISK ASSESSMENT

## 2021-11-12 ENCOUNTER — Encounter: Payer: Self-pay | Admitting: Physician Assistant

## 2021-11-12 ENCOUNTER — Ambulatory Visit (INDEPENDENT_AMBULATORY_CARE_PROVIDER_SITE_OTHER): Payer: 59 | Admitting: Physician Assistant

## 2021-11-12 VITALS — BP 124/70 | HR 87 | Temp 97.4°F | Ht 61.0 in | Wt 248.4 lb

## 2021-11-12 DIAGNOSIS — J060 Acute laryngopharyngitis: Secondary | ICD-10-CM

## 2021-11-12 LAB — POCT RAPID STREP A (OFFICE): Rapid Strep A Screen: NEGATIVE

## 2021-11-12 LAB — POC COVID19 BINAXNOW: SARS Coronavirus 2 Ag: NEGATIVE

## 2021-11-12 MED ORDER — AMOXICILLIN 875 MG PO TABS: 875.0000 mg | ORAL_TABLET | Freq: Two times a day (BID) | ORAL | 0 refills | Status: AC

## 2021-11-12 NOTE — Progress Notes (Signed)
? ?Acute Office Visit ? ?Subjective:  ? ? Patient ID: Cindy Hammond, female    DOB: July 07, 1966, 55 y.o.   MRN: [id number] ? ?Chief Complaint  ?Patient presents with  ? Cough  ? Sore Throat  ? ? ?HPI: ?Patient is in today for complaints of sore throat, low grade fever and cough that started last night.  Has had mild headaches as well.  Has had minimal congestion.  Has not been taking any decongestants or antihistamines for drainage ?Complains of general malaise ? ?Past Medical History:  ?Diagnosis Date  ? Anxiety   ? Depression   ? GERD (gastroesophageal reflux disease)   ? Hyperlipidemia   ? Hypertension   ? Pain in joint, lower leg   ? Thrombocytopenia ([place])   ? ? ?Past Surgical History:  ?Procedure Laterality Date  ? TUBAL LIGATION    ? ? ?Family History  ?Problem Relation Age of Onset  ? Hyperlipidemia Mother   ? Hypertension Mother   ? Hyperlipidemia Father   ? Hypertension Father   ? ? ?Social History  ? ?Socioeconomic History  ? Marital status: Married  ?  Spouse name: Not on file  ? Number of children: 3  ? Years of education: Not on file  ? Highest education level: Not on file  ?Occupational History  ? Occupation: Unemployed  ?Tobacco Use  ? Smoking status: Former  ?  Packs/day: 0.50  ?  Years: 10.00  ?  Pack years: 5.00  ?  Types: Cigarettes  ?  Quit date: 2000  ?  Years since quitting: 23.2  ? Smokeless tobacco: Never  ?Vaping Use  ? Vaping Use: Never used  ?Substance and Sexual Activity  ? Alcohol use: No  ?  Alcohol/week: 0.0 standard drinks  ? Drug use: No  ? Sexual activity: Yes  ?  Partners: Male  ?Other Topics Concern  ? Not on file  ?Social History Narrative  ? Not on file  ? ?Social Determinants of Health  ? ?Financial Resource Strain: Not on file  ?Food Insecurity: Not on file  ?Transportation Needs: Not on file  ?Physical Activity: Not on file  ?Stress: Not on file  ?Social Connections: Not on file  ?Intimate Partner Violence: Not on file  ? ? ?Outpatient Medications Prior to Visit  ?Medication Sig  Dispense Refill  ? albuterol (VENTOLIN HFA) 108 (90 Base) MCG/ACT inhaler Inhale 1-2 puffs into the lungs every 6 (six) hours as needed for wheezing or shortness of breath. 1 each 2  ? amitriptyline (ELAVIL) 150 MG tablet TAKE 1 TABLET BY MOUTH DAILY AT BEDTIME. 90 tablet 1  ? atenolol (TENORMIN) 25 MG tablet Take 1 tablet (25 mg total) by mouth 2 (two) times daily. (Patient taking differently: Take 50 mg by mouth every morning.) 180 tablet 3  ? atorvastatin (LIPITOR) 40 MG tablet TAKE 1 TABLET BY MOUTH ONCE DAILY (Patient taking differently: Take 40 mg by mouth daily.) 90 tablet 1  ? benzonatate (TESSALON) 100 MG capsule Take 1 capsule (100 mg total) by mouth 2 (two) times daily as needed for cough. 30 capsule 0  ? busPIRone (BUSPAR) 30 MG tablet Take 1 tablet (30 mg total) by mouth 2 (two) times daily. 180 tablet 1  ? clonazePAM (KLONOPIN) 0.5 MG tablet Take 1 tablet (0.5 mg total) by mouth 2 (two) times daily. 60 tablet 0  ? dicyclomine (BENTYL) 20 MG tablet Take 1 tablet (20 mg total) by mouth 3 (three) times daily before meals. 90 tablet 1  ?  furosemide (LASIX) 40 MG tablet Take 1 tablet (40 mg total) by mouth daily. 90 tablet 3  ? KRILL OIL PO Take 1 capsule by mouth 2 (two) times daily.    ? nitroGLYCERIN (NITROSTAT) 0.4 MG SL tablet PLACE 1 TABLET UNDER THE TONGUE AS NEEDED FOR CHEST PAIN ( MAY REPEAT EVERY5 MINUTES X 3) 25 tablet 3  ? omeprazole (PRILOSEC) 40 MG capsule Take 1 capsule (40 mg total) by mouth daily. (Patient taking differently: Take 40 mg by mouth daily as needed (acid reflux).) 90 capsule 1  ? pregabalin (LYRICA) 75 MG capsule TAKE 1 CAPSULE BY MOUTH 3 TIMES DAILY. 90 capsule 0  ? QUEtiapine (SEROQUEL) 300 MG tablet Take 1 tablet (300 mg total) by mouth at bedtime. 90 tablet 1  ? ranolazine (RANEXA) 500 MG 12 hr tablet Take 1 tablet (500 mg total) by mouth 2 (two) times daily. 60 tablet 11  ? topiramate (TOPAMAX) 100 MG tablet TAKE ONE (1) TABLET BY MOUTH 3 TIMES DAILY (Patient taking  differently: Take 100 mg by mouth 2 (two) times daily.) 270 tablet 1  ? Ubrogepant (UBRELVY) 100 MG TABS Take 100 mg by mouth 2 (two) times daily as needed (migraine).    ? Vitamin D, Ergocalciferol, (DRISDOL) 1.25 MG (50000 UNIT) CAPS capsule TAKE 1 CAPSULE BY MOUTH TWICE A WEEK 10 capsule 1  ? ?No facility-administered medications prior to visit.  ? ? ?Allergies  ?Allergen Reactions  ? Sumatriptan Hives and Rash  ? Sulfa Antibiotics   ? ? ?Review of Systems ?CONSTITUTIONAL: Negative for chills, fatigue, fever, unintentional weight gain and unintentional weight loss.  ?E/N/T: see HPI ?CARDIOVASCULAR: Negative for chest pain, dizziness, palpitations and pedal edema.  ?RESPIRATORY: Negative for recent cough and dyspnea.  ?GASTROINTESTINAL: Negative for abdominal pain, acid reflux symptoms, constipation, diarrhea, nausea and vomiting.  ?INTEGUMENTARY: Negative for rash.  ? ? ?   ?Objective:  ?PHYSICAL EXAM:  ? ?VS: BP 124/70   Pulse 87   Temp (!) 97.4 ?F (36.3 ?C)   Ht 5' 1"  (1.549 m)   Wt 248 lb 6.4 oz (112.7 kg)   SpO2 96%   BMI 46.93 kg/m?  ? ?GEN: Well nourished, well developed, in no acute distress  ?HEENT: normal external ears and nose - normal external auditory canals and TMS - hearing grossly normal -- Lips, Teeth and Gums - normal  ?Oropharynx - moderate erythema/pnd ?Cardiac: RRR; no murmurs,  ?Respiratory:  normal respiratory rate and pattern with no distress - normal breath sounds with no rales, rhonchi, wheezes or rubs ? ?Office Visit on 11/12/2021  ?Component Date Value Ref Range Status  ? SARS Coronavirus 2 Ag 11/12/2021 Negative  Negative Final  ? Rapid Strep A Screen 11/12/2021 Negative  Negative Final  ? ? ?Health Maintenance Due  ?Topic Date Due  ? PAP SMEAR-Modifier  Never done  ? ? ?There are no preventive care reminders to display for this patient. ? ? ?Lab Results  ?Component Value Date  ? TSH 1.560 10/01/2021  ? ?Lab Results  ?Component Value Date  ? WBC 6.6 11/07/2021  ? HGB 15.9  11/07/2021  ? HCT 46.8 (H) 11/07/2021  ? MCV 93 11/07/2021  ? PLT 319 11/07/2021  ? ?Lab Results  ?Component Value Date  ? NA 142 11/07/2021  ? K 3.8 11/07/2021  ? CO2 23 11/07/2021  ? GLUCOSE 117 (H) 11/07/2021  ? BUN 14 11/07/2021  ? CREATININE 1.01 (H) 11/07/2021  ? BILITOT 0.3 11/07/2021  ? ALKPHOS 172 (H)  11/07/2021  ? AST 28 11/07/2021  ? ALT 32 11/07/2021  ? PROT 7.2 11/07/2021  ? ALBUMIN 4.3 11/07/2021  ? CALCIUM 9.1 11/07/2021  ? ANIONGAP 9 08/25/2021  ? EGFR 66 11/07/2021  ? ?Lab Results  ?Component Value Date  ? CHOL 202 (H) 11/07/2021  ? ?Lab Results  ?Component Value Date  ? HDL 40 11/07/2021  ? ?Lab Results  ?Component Value Date  ? LDLCALC 122 (H) 11/07/2021  ? ?Lab Results  ?Component Value Date  ? TRIG 228 (H) 11/07/2021  ? ?Lab Results  ?Component Value Date  ? CHOLHDL 5.1 (H) 11/07/2021  ? ?Lab Results  ?Component Value Date  ? HGBA1C 5.9 (H) 11/07/2021  ? ? ?   ?Assessment & Plan:  ? ?Problem List Items Addressed This Visit   ? ?  ? Respiratory  ? Acute laryngopharyngitis - Primary  ? Relevant Medications  ? amoxicillin (AMOXIL) 875 MG tablet  ? Other Relevant Orders  ? POC COVID-19 (Completed)  ? POCT rapid strep A (Completed)  ? ?Meds ordered this encounter  ?Medications  ? amoxicillin (AMOXIL) 875 MG tablet  ?  Sig: Take 1 tablet (875 mg total) by mouth 2 (two) times daily for 10 days.  ?  Dispense:  20 tablet  ?  Refill:  0  ?  Order Specific Question:   Supervising Provider  ?  AnswerRochel Brome [[id number]]  ? ? ?Orders Placed This Encounter  ?Procedures  ? [place] COVID-19  ? POCT rapid strep A  ?  ? ?Follow-up: Return if symptoms worsen or fail to improve. ? ?An After Visit Summary was printed and given to the patient. ? ?SARA R Austyn Seier, PA-C ?[place] ?[phone] ?

## 2021-11-13 ENCOUNTER — Other Ambulatory Visit: Payer: Self-pay | Admitting: Physician Assistant

## 2021-11-13 DIAGNOSIS — E559 Vitamin D deficiency, unspecified: Secondary | ICD-10-CM

## 2021-11-15 ENCOUNTER — Other Ambulatory Visit: Payer: Self-pay | Admitting: Physician Assistant

## 2021-11-19 ENCOUNTER — Other Ambulatory Visit: Payer: Self-pay | Admitting: Physician Assistant

## 2021-11-19 DIAGNOSIS — R1084 Generalized abdominal pain: Secondary | ICD-10-CM

## 2021-12-12 ENCOUNTER — Other Ambulatory Visit: Payer: Self-pay | Admitting: Physician Assistant

## 2021-12-13 ENCOUNTER — Other Ambulatory Visit: Payer: Self-pay | Admitting: Physician Assistant

## 2021-12-13 DIAGNOSIS — F419 Anxiety disorder, unspecified: Secondary | ICD-10-CM

## 2022-01-02 ENCOUNTER — Other Ambulatory Visit: Payer: Self-pay | Admitting: Physician Assistant

## 2022-01-02 ENCOUNTER — Encounter: Payer: Self-pay | Admitting: Physician Assistant

## 2022-01-02 DIAGNOSIS — F419 Anxiety disorder, unspecified: Secondary | ICD-10-CM

## 2022-01-03 ENCOUNTER — Other Ambulatory Visit: Payer: Self-pay | Admitting: Physician Assistant

## 2022-01-03 DIAGNOSIS — F419 Anxiety disorder, unspecified: Secondary | ICD-10-CM

## 2022-01-03 MED ORDER — BUSPIRONE HCL 30 MG PO TABS: 30.0000 mg | ORAL_TABLET | Freq: Two times a day (BID) | ORAL | 1 refills | Status: DC

## 2022-01-03 NOTE — Telephone Encounter (Signed)
sent 

## 2022-01-13 DIAGNOSIS — G43709 Chronic migraine without aura, not intractable, without status migrainosus: Secondary | ICD-10-CM | POA: Diagnosis not present

## 2022-01-13 DIAGNOSIS — R29898 Other symptoms and signs involving the musculoskeletal system: Secondary | ICD-10-CM | POA: Diagnosis not present

## 2022-02-07 ENCOUNTER — Other Ambulatory Visit: Payer: Self-pay | Admitting: Physician Assistant

## 2022-02-07 DIAGNOSIS — F419 Anxiety disorder, unspecified: Secondary | ICD-10-CM

## 2022-02-07 MED ORDER — CLONAZEPAM 0.5 MG PO TABS: 0.5000 mg | ORAL_TABLET | Freq: Two times a day (BID) | ORAL | 0 refills | Status: DC

## 2022-02-11 ENCOUNTER — Other Ambulatory Visit: Payer: Self-pay | Admitting: Physician Assistant

## 2022-02-11 DIAGNOSIS — E559 Vitamin D deficiency, unspecified: Secondary | ICD-10-CM

## 2022-02-14 ENCOUNTER — Ambulatory Visit: Payer: 59 | Admitting: Cardiology

## 2022-02-26 ENCOUNTER — Encounter: Payer: Self-pay | Admitting: Physician Assistant

## 2022-02-27 DIAGNOSIS — F418 Other specified anxiety disorders: Secondary | ICD-10-CM | POA: Diagnosis not present

## 2022-02-27 DIAGNOSIS — F411 Generalized anxiety disorder: Secondary | ICD-10-CM | POA: Diagnosis not present

## 2022-02-27 DIAGNOSIS — R69 Illness, unspecified: Secondary | ICD-10-CM | POA: Diagnosis not present

## 2022-02-27 NOTE — Telephone Encounter (Signed)
I spoke with mother.  Pt with history of migraines and 'eyes bulging' - has been told possibly has Graves disease and needs referral Sadly I have no openings for new patient until 7/10 - they switched to this appt and if something opens up sooner I will let them know

## 2022-03-19 ENCOUNTER — Other Ambulatory Visit: Payer: Self-pay | Admitting: Physician Assistant

## 2022-03-19 DIAGNOSIS — R1084 Generalized abdominal pain: Secondary | ICD-10-CM

## 2022-03-26 ENCOUNTER — Ambulatory Visit: Payer: 59 | Admitting: Cardiology

## 2022-03-26 ENCOUNTER — Encounter: Payer: Self-pay | Admitting: Cardiology

## 2022-03-26 ENCOUNTER — Telehealth: Payer: Self-pay | Admitting: Cardiology

## 2022-03-26 VITALS — BP 115/60 | HR 77 | Ht 62.0 in | Wt 247.8 lb

## 2022-03-26 DIAGNOSIS — E782 Mixed hyperlipidemia: Secondary | ICD-10-CM

## 2022-03-26 DIAGNOSIS — Z79899 Other long term (current) drug therapy: Secondary | ICD-10-CM | POA: Diagnosis not present

## 2022-03-26 DIAGNOSIS — I10 Essential (primary) hypertension: Secondary | ICD-10-CM

## 2022-03-26 LAB — BASIC METABOLIC PANEL WITH GFR
BUN/Creatinine Ratio: 9 (ref 9–23)
BUN: 9 mg/dL (ref 6–24)
CO2: 21 mmol/L (ref 20–29)
Calcium: 9.3 mg/dL (ref 8.7–10.2)
Chloride: 108 mmol/L — ABNORMAL HIGH (ref 96–106)
Creatinine, Ser: 0.96 mg/dL (ref 0.57–1.00)
Glucose: 91 mg/dL (ref 70–99)
Potassium: 4.3 mmol/L (ref 3.5–5.2)
Sodium: 144 mmol/L (ref 134–144)
eGFR: 70 mL/min/1.73

## 2022-03-26 LAB — MAGNESIUM: Magnesium: 2.1 mg/dL (ref 1.6–2.3)

## 2022-03-26 NOTE — Patient Instructions (Signed)
Medication Instructions:  Your physician recommends that you continue on your current medications as directed. Please refer to the Current Medication list given to you today.  *If you need a refill on your cardiac medications before your next appointment, please call your pharmacy*   Lab Work: BMET and Magnesium  If you have labs (blood work) drawn today and your tests are completely normal, you will receive your results only by: MyChart Message (if you have MyChart) OR A paper copy in the mail If you have any lab test that is abnormal or we need to change your treatment, we will call you to review the results.   Testing/Procedures: NONE   Follow-Up: At [facility], you and your health needs are our priority.  As part of our continuing mission to provide you with exceptional heart care, we have created designated Provider Care Teams.  These Care Teams include your primary Cardiologist (physician) and Advanced Practice Providers (APPs -  Physician Assistants and Nurse Practitioners) who all work together to provide you with the care you need, when you need it.  We recommend signing up for the patient portal called "MyChart".  Sign up information is provided on this After Visit Summary.  MyChart is used to connect with patients for Virtual Visits (Telemedicine).  Patients are able to view lab/test results, encounter notes, upcoming appointments, etc.  Non-urgent messages can be sent to your provider as well.   To learn more about what you can do with MyChart, go to [email or web address].    Your next appointment:   9 month(s)  The format for your next appointment:   In Person  Provider:   Thomasene Ripple, DO

## 2022-03-26 NOTE — Progress Notes (Signed)
Cardiology Office Note:    Date:  03/27/2022   ID:  Cindy Hammond, DOB [birth year], MRN [id number]  PCP:  Marianne Sofia, PA-C  Cardiologist:  Thomasene Ripple, DO  Electrophysiologist:  None   Referring MD: Marianne Sofia, Cordelia Poche   'I am doing well"   History of Present Illness:    Cindy Hammond is a 56 y.o. female with a hx of nonobstructive minimal coronary artery disease seen on coronary CTA done in January 2022, hypertension, hyperlipidemia, chronic diastolic heart failure is here today for follow-up visit.   I saw the patient for the 2021 at that time she experiencing some shortness of and the patient for echocardiogram test will coronary CTA.  In the interim she was able to get these testings done.   I saw the patient on June 06, 2021 at that time she did have some bilateral leg edema and started patient on Lasix 40 mg daily.  Did cut back on her atenolol to help with her blood pressure.  She has not tried sublingual nitroglycerin for her chest discomfort.  I encouraged her to do so.  At her last visit she was experiencing intermittent chest pain so I started her on Imdur 15 mg daily. We continue the rest of meds. Since her visit the chest pain had improve some.  Past Medical History:  Diagnosis Date   Anxiety    Depression    GERD (gastroesophageal reflux disease)    Hyperlipidemia    Hypertension    Pain in joint, lower leg    Thrombocytopenia (HCC)     Past Surgical History:  Procedure Laterality Date   TUBAL LIGATION      Current Medications: Current Meds  Medication Sig   albuterol (VENTOLIN HFA) 108 (90 Base) MCG/ACT inhaler Inhale 1-2 puffs into the lungs every 6 (six) hours as needed for wheezing or shortness of breath.   amitriptyline (ELAVIL) 150 MG tablet TAKE 1 TABLET BY MOUTH DAILY AT BEDTIME.   atorvastatin (LIPITOR) 40 MG tablet TAKE 1 TABLET BY MOUTH ONCE DAILY   benzonatate (TESSALON) 100 MG capsule Take 1 capsule (100 mg total) by mouth 2 (two) times daily as  needed for cough.   busPIRone (BUSPAR) 30 MG tablet Take 1 tablet (30 mg total) by mouth 2 (two) times daily.   clonazePAM (KLONOPIN) 0.5 MG tablet Take 1 tablet (0.5 mg total) by mouth 2 (two) times daily.   dicyclomine (BENTYL) 20 MG tablet TAKE 1 TABLET BY MOUTH THREE(3) TIMES DAILY BEFORE MEALS   KRILL OIL PO Take 1 capsule by mouth 2 (two) times daily.   nitroGLYCERIN (NITROSTAT) 0.4 MG SL tablet PLACE 1 TABLET UNDER THE TONGUE AS NEEDED FOR CHEST PAIN ( MAY REPEAT EVERY5 MINUTES X 3)   omeprazole (PRILOSEC) 40 MG capsule Take 1 capsule (40 mg total) by mouth daily. (Patient taking differently: Take 40 mg by mouth daily as needed (acid reflux).)   pregabalin (LYRICA) 75 MG capsule TAKE 1 CAPSULE BY MOUTH 3 TIMES DAILY.   QUEtiapine (SEROQUEL) 300 MG tablet Take 1 tablet (300 mg total) by mouth at bedtime.   ranolazine (RANEXA) 500 MG 12 hr tablet Take 1 tablet (500 mg total) by mouth 2 (two) times daily.   topiramate (TOPAMAX) 100 MG tablet TAKE ONE (1) TABLET BY MOUTH 3 TIMES DAILY   Ubrogepant (UBRELVY) 100 MG TABS Take 100 mg by mouth 2 (two) times daily as needed (migraine).   Vitamin D, Ergocalciferol, (DRISDOL) 1.25 MG (50000 UNIT) CAPS capsule TAKE  1 CAPSULE BY MOUTH TWICE A WEEK     Allergies:   Sumatriptan and Sulfa antibiotics   Social History   Socioeconomic History   Marital status: Married    Spouse name: Not on file   Number of children: 3   Years of education: Not on file   Highest education level: Not on file  Occupational History   Occupation: Unemployed  Tobacco Use   Smoking status: Former    Packs/day: 0.50    Years: 10.00    Total pack years: 5.00    Types: Cigarettes    Quit date: 2000    Years since quitting: 23.5   Smokeless tobacco: Never  Vaping Use   Vaping Use: Never used  Substance and Sexual Activity   Alcohol use: No    Alcohol/week: 0.0 standard drinks of alcohol   Drug use: No   Sexual activity: Yes    Partners: Male  Other Topics  Concern   Not on file  Social History Narrative   Not on file   Social Determinants of Health   Financial Resource Strain: Not on file  Food Insecurity: Not on file  Transportation Needs: Not on file  Physical Activity: Not on file  Stress: Not on file  Social Connections: Not on file     Family History: The patient's family history includes Hyperlipidemia in her father and mother; Hypertension in her father and mother.  ROS:   Review of Systems  Constitution: Negative for decreased appetite, fever and weight gain.  HENT: Negative for congestion, ear discharge, hoarse voice and sore throat.   Eyes: Negative for discharge, redness, vision loss in right eye and visual halos.  Cardiovascular: Negative for chest pain, dyspnea on exertion, leg swelling, orthopnea and palpitations.  Respiratory: Negative for cough, hemoptysis, shortness of breath and snoring.   Endocrine: Negative for heat intolerance and polyphagia.  Hematologic/Lymphatic: Negative for bleeding problem. Does not bruise/bleed easily.  Skin: Negative for flushing, nail changes, rash and suspicious lesions.  Musculoskeletal: Negative for arthritis, joint pain, muscle cramps, myalgias, neck pain and stiffness.  Gastrointestinal: Negative for abdominal pain, bowel incontinence, diarrhea and excessive appetite.  Genitourinary: Negative for decreased libido, genital sores and incomplete emptying.  Neurological: Negative for brief paralysis, focal weakness, headaches and loss of balance.  Psychiatric/Behavioral: Negative for altered mental status, depression and suicidal ideas.  Allergic/Immunologic: Negative for HIV exposure and persistent infections.    EKGs/Labs/Other Studies Reviewed:    The following studies were reviewed today:   EKG:  The ekg ordered today demonstrates   Recent Labs: 10/01/2021: TSH 1.560 11/07/2021: ALT 32; Hemoglobin 15.9; Platelets 319 03/26/2022: BUN 9; Creatinine, Ser 0.96; Magnesium 2.1;  Potassium 4.3; Sodium 144  Recent Lipid Panel    Component Value Date/Time   CHOL 202 (H) 11/07/2021 1025   TRIG 228 (H) 11/07/2021 1025   HDL 40 11/07/2021 1025   CHOLHDL 5.1 (H) 11/07/2021 1025   LDLCALC 122 (H) 11/07/2021 1025    Physical Exam:    VS:  BP 115/60   Pulse 77   Ht 5\' 2"  (1.575 m)   Wt 247 lb 12.8 oz (112.4 kg)   SpO2 97%   BMI 45.32 kg/m     Wt Readings from Last 3 Encounters:  03/26/22 247 lb 12.8 oz (112.4 kg)  11/12/21 248 lb 6.4 oz (112.7 kg)  11/07/21 241 lb 6.4 oz (109.5 kg)     GEN: Well nourished, well developed in no acute distress HEENT: Normal NECK: No  JVD; No carotid bruits LYMPHATICS: No lymphadenopathy CARDIAC: S1S2 noted,RRR, no murmurs, rubs, gallops RESPIRATORY:  Clear to auscultation without rales, wheezing or rhonchi  ABDOMEN: Soft, non-tender, non-distended, +bowel sounds, no guarding. EXTREMITIES: No edema, No cyanosis, no clubbing MUSCULOSKELETAL:  No deformity  SKIN: Warm and dry NEUROLOGIC:  Alert and oriented x 3, non-focal PSYCHIATRIC:  Normal affect, good insight  ASSESSMENT:    1. Medication management   2. Benign hypertension   3. Mixed hyperlipidemia   4. Morbid obesity (HCC)    PLAN:     1.  The patient describes the sound musculoskeletal it is intermittent.  Have asked the patient to monitor this pain if it becomes frequent we will reassess the need for any further ischemic evaluation.  We will continue her current medication regimen.  Blood work will be done today to assess her BMP and mag.  The patient understands the need to lose weight with diet and exercise. We have discussed specific strategies for this.  The patient is in agreement with the above plan. The patient left the office in stable condition.  The patient will follow up in   Medication Adjustments/Labs and Tests Ordered: Current medicines are reviewed at length with the patient today.  Concerns regarding medicines are outlined above.  Orders  Placed This Encounter  Procedures   Basic Metabolic Panel (BMET)   Magnesium   No orders of the defined types were placed in this encounter.   Patient Instructions  Medication Instructions:  Your physician recommends that you continue on your current medications as directed. Please refer to the Current Medication list given to you today.  *If you need a refill on your cardiac medications before your next appointment, please call your pharmacy*   Lab Work: BMET and Magnesium  If you have labs (blood work) drawn today and your tests are completely normal, you will receive your results only by: MyChart Message (if you have MyChart) OR A paper copy in the mail If you have any lab test that is abnormal or we need to change your treatment, we will call you to review the results.   Testing/Procedures: NONE   Follow-Up: At [facility], you and your health needs are our priority.  As part of our continuing mission to provide you with exceptional heart care, we have created designated Provider Care Teams.  These Care Teams include your primary Cardiologist (physician) and Advanced Practice Providers (APPs -  Physician Assistants and Nurse Practitioners) who all work together to provide you with the care you need, when you need it.  We recommend signing up for the patient portal called "MyChart".  Sign up information is provided on this After Visit Summary.  MyChart is used to connect with patients for Virtual Visits (Telemedicine).  Patients are able to view lab/test results, encounter notes, upcoming appointments, etc.  Non-urgent messages can be sent to your provider as well.   To learn more about what you can do with MyChart, go to [email or web address].    Your next appointment:   9 month(s)  The format for your next appointment:   In Person  Provider:   Thomasene Ripple, DO     Adopting a Healthy Lifestyle.  Know what a healthy weight is for you (roughly BMI <25) and aim to  maintain this   Aim for 7+ servings of fruits and vegetables daily   65-80+ fluid ounces of water or unsweet tea for healthy kidneys   Limit to max 1 drink of alcohol  per day; avoid smoking/tobacco   Limit animal fats in diet for cholesterol and heart health - choose grass fed whenever available   Avoid highly processed foods, and foods high in saturated/trans fats   Aim for low stress - take time to unwind and care for your mental health   Aim for 150 min of moderate intensity exercise weekly for heart health, and weights twice weekly for bone health   Aim for 7-9 hours of sleep daily   When it comes to diets, agreement about the perfect plan isnt easy to find, even among the experts. Experts at the [facility] [employer] developed an idea known as the Healthy Eating Plate. Just imagine a plate divided into logical, healthy portions.   The emphasis is on diet quality:   Load up on vegetables and fruits - one-half of your plate: Aim for color and variety, and remember that potatoes dont count.   Go for whole grains - one-quarter of your plate: Whole wheat, barley, wheat berries, quinoa, oats, brown rice, and foods made with them. If you want pasta, go with whole wheat pasta.   Protein power - one-quarter of your plate: Fish, chicken, beans, and nuts are all healthy, versatile protein sources. Limit red meat.   The diet, however, does go beyond the plate, offering a few other suggestions.   Use healthy plant oils, such as olive, canola, soy, corn, sunflower and peanut. Check the labels, and avoid partially hydrogenated oil, which have unhealthy trans fats.   If youre thirsty, drink water. Coffee and tea are good in moderation, but skip sugary drinks and limit milk and dairy products to one or two daily servings.   The type of carbohydrate in the diet is more important than the amount. Some sources of carbohydrates, such as vegetables, fruits, whole grains, and beans-are  healthier than others.   Finally, stay active  Signed, Thomasene Ripple, DO  03/27/2022 9:01 PM    [facility]

## 2022-03-26 NOTE — Telephone Encounter (Signed)
Spoke to patient she stated she is presently in our lobby.She has appointment with Dr.Tobb this morning.She wanted to let Dr.Tobb know she can do what she requested.

## 2022-03-26 NOTE — Telephone Encounter (Signed)
Pt calling to let Dr. Servando Salina know that she is able to do what she ask ( per their conversation this morning) in August. Please advise

## 2022-03-31 ENCOUNTER — Other Ambulatory Visit: Payer: Self-pay | Admitting: Family Medicine

## 2022-03-31 DIAGNOSIS — F419 Anxiety disorder, unspecified: Secondary | ICD-10-CM

## 2022-04-05 ENCOUNTER — Other Ambulatory Visit: Payer: Self-pay | Admitting: Physician Assistant

## 2022-04-05 DIAGNOSIS — E559 Vitamin D deficiency, unspecified: Secondary | ICD-10-CM

## 2022-04-22 ENCOUNTER — Encounter: Payer: Self-pay | Admitting: Physician Assistant

## 2022-04-28 ENCOUNTER — Other Ambulatory Visit: Payer: Self-pay | Admitting: Physician Assistant

## 2022-04-28 DIAGNOSIS — R69 Illness, unspecified: Secondary | ICD-10-CM | POA: Diagnosis not present

## 2022-04-29 DIAGNOSIS — Z1231 Encounter for screening mammogram for malignant neoplasm of breast: Secondary | ICD-10-CM | POA: Diagnosis not present

## 2022-04-29 LAB — HM MAMMOGRAPHY

## 2022-04-30 ENCOUNTER — Ambulatory Visit: Payer: 59 | Admitting: Physician Assistant

## 2022-04-30 ENCOUNTER — Encounter: Payer: Self-pay | Admitting: Physician Assistant

## 2022-04-30 VITALS — BP 108/72 | HR 87 | Temp 97.1°F | Ht 62.0 in | Wt 246.6 lb

## 2022-04-30 DIAGNOSIS — R7303 Prediabetes: Secondary | ICD-10-CM

## 2022-04-30 DIAGNOSIS — E782 Mixed hyperlipidemia: Secondary | ICD-10-CM | POA: Diagnosis not present

## 2022-04-30 DIAGNOSIS — I10 Essential (primary) hypertension: Secondary | ICD-10-CM | POA: Diagnosis not present

## 2022-04-30 DIAGNOSIS — E559 Vitamin D deficiency, unspecified: Secondary | ICD-10-CM

## 2022-04-30 DIAGNOSIS — K219 Gastro-esophageal reflux disease without esophagitis: Secondary | ICD-10-CM

## 2022-04-30 NOTE — Progress Notes (Signed)
Established Patient Office Visit  Subjective:  Patient ID: Cindy Hammond, female    DOB: 04-Feb-1966  Age: 56 y.o. MRN: [id number]  CC:  Chief Complaint  Patient presents with   Hypertension    HPI Cindy Hammond presents for follow up hyperlipidemia and chronic issues  Mixed hyperlipidemia  Pt presents with hyperlipidemia.  Compliance with treatment has been good; The patient is compliant with medications, maintains a low cholesterol diet , follows up as directed , . The patient denies experiencing any hypercholesterolemia related symptoms.  Evidenced based information based on history , exam, and other sources has been used  for decision making. She is currently taking lipitor 47m qd  Pt presents for follow up of hypertension.  The patient is tolerating the medication well without side effects. Compliance with treatment has been good; including taking medication as directed ,    She is currently on atenolol 25 mg qd and lasix 477mqd- she denies chest pain or shortness of breath Pt follows with Dr ToHarriet Massonardiologist regularly who has her taking Ranexa and use nitrrostat as needed.  Pt is unsure when she has next follow up appt  Pt with history of GERD - stable on omeprazole 4031md  Pt with history of anxiety - states her symptoms controlled well with clonazepam, amitriptyline,seroquel and buspar - voices no problems or concerns - she states she has started seeing a therapist as well - JarTacy Durat currently taking Vit D weekly - due for labwork  Pt with history of migraines - currently stable on topamax and ubrelvy- she follows regularly with amy KeaTeryl Lucy  Patient has history of prediabetes - states at this time is not really watching diet well but trying  Past Medical History:  Diagnosis Date   Anxiety    Depression    GERD (gastroesophageal reflux disease)    Hyperlipidemia    Hypertension    Pain in joint, lower leg    Thrombocytopenia (HCC)     Past Surgical History:   Procedure Laterality Date   TUBAL LIGATION      Family History  Problem Relation Age of Onset   Hyperlipidemia Mother    Hypertension Mother    Hyperlipidemia Father    Hypertension Father     Social History   Socioeconomic History   Marital status: Married    Spouse name: Not on file   Number of children: 3   Years of education: Not on file   Highest education level: Not on file  Occupational History   Occupation: Unemployed  Tobacco Use   Smoking status: Former    Packs/day: 0.50    Years: 10.00    Total pack years: 5.00    Types: Cigarettes    Quit date: 2000    Years since quitting: 23.6   Smokeless tobacco: Never  Vaping Use   Vaping Use: Never used  Substance and Sexual Activity   Alcohol use: No    Alcohol/week: 0.0 standard drinks of alcohol   Drug use: No   Sexual activity: Yes    Partners: Male  Other Topics Concern   Not on file  Social History Narrative   Not on file   Social Determinants of Health   Financial Resource Strain: Not on file  Food Insecurity: Not on file  Transportation Needs: Not on file  Physical Activity: Not on file  Stress: Not on file  Social Connections: Not on file  Intimate Partner Violence: Not on file  Current Outpatient Medications:    albuterol (VENTOLIN HFA) 108 (90 Base) MCG/ACT inhaler, Inhale 1-2 puffs into the lungs every 6 (six) hours as needed for wheezing or shortness of breath., Disp: 1 each, Rfl: 2   amitriptyline (ELAVIL) 150 MG tablet, TAKE 1 TABLET BY MOUTH DAILY AT BEDTIME., Disp: 90 tablet, Rfl: 1   atorvastatin (LIPITOR) 40 MG tablet, TAKE 1 TABLET BY MOUTH ONCE DAILY, Disp: 90 tablet, Rfl: 1   benzonatate (TESSALON) 100 MG capsule, Take 1 capsule (100 mg total) by mouth 2 (two) times daily as needed for cough., Disp: 30 capsule, Rfl: 0   busPIRone (BUSPAR) 30 MG tablet, Take 1 tablet (30 mg total) by mouth 2 (two) times daily., Disp: 180 tablet, Rfl: 1   clonazePAM (KLONOPIN) 0.5 MG tablet, Take  1 tablet (0.5 mg total) by mouth 2 (two) times daily., Disp: 60 tablet, Rfl: 2   dicyclomine (BENTYL) 20 MG tablet, TAKE 1 TABLET BY MOUTH THREE(3) TIMES DAILY BEFORE MEALS, Disp: 90 tablet, Rfl: 1   KRILL OIL PO, Take 1 capsule by mouth 2 (two) times daily., Disp: , Rfl:    nitroGLYCERIN (NITROSTAT) 0.4 MG SL tablet, PLACE 1 TABLET UNDER THE TONGUE AS NEEDED FOR CHEST PAIN ( MAY REPEAT EVERY5 MINUTES X 3), Disp: 25 tablet, Rfl: 3   omeprazole (PRILOSEC) 40 MG capsule, Take 1 capsule (40 mg total) by mouth daily. (Patient taking differently: Take 40 mg by mouth daily as needed (acid reflux).), Disp: 90 capsule, Rfl: 1   pregabalin (LYRICA) 75 MG capsule, TAKE 1 CAPSULE BY MOUTH 3 TIMES DAILY., Disp: 270 capsule, Rfl: 0   QUEtiapine (SEROQUEL) 300 MG tablet, Take 1 tablet (300 mg total) by mouth at bedtime., Disp: 90 tablet, Rfl: 1   ranolazine (RANEXA) 500 MG 12 hr tablet, Take 1 tablet (500 mg total) by mouth 2 (two) times daily., Disp: 60 tablet, Rfl: 11   topiramate (TOPAMAX) 100 MG tablet, TAKE ONE (1) TABLET BY MOUTH 3 TIMES DAILY, Disp: 270 tablet, Rfl: 1   Ubrogepant (UBRELVY) 100 MG TABS, Take 100 mg by mouth 2 (two) times daily as needed (migraine)., Disp: , Rfl:    Vitamin D, Ergocalciferol, (DRISDOL) 1.25 MG (50000 UNIT) CAPS capsule, TAKE 1 CAPSULE BY MOUTH TWICE A WEEK, Disp: 10 capsule, Rfl: 1   atenolol (TENORMIN) 25 MG tablet, Take 1 tablet (25 mg total) by mouth 2 (two) times daily. (Patient taking differently: Take 50 mg by mouth every morning.), Disp: 180 tablet, Rfl: 3   furosemide (LASIX) 40 MG tablet, Take 1 tablet (40 mg total) by mouth daily., Disp: 90 tablet, Rfl: 3   Allergies  Allergen Reactions   Sumatriptan Hives and Rash   Sulfa Antibiotics    CONSTITUTIONAL: Negative for chills, fatigue, fever, unintentional weight gain and unintentional weight loss.  E/N/T: Negative for ear pain, nasal congestion and sore throat.  CARDIOVASCULAR: Negative for chest pain,  dizziness, palpitations and pedal edema.  RESPIRATORY: Negative for recent cough and dyspnea.  GASTROINTESTINAL: Negative for abdominal pain, acid reflux symptoms, constipation, diarrhea, nausea and vomiting.  MSK: Negative for arthralgias and myalgias.  INTEGUMENTARY: Negative for rash.  NEUROLOGICAL: Negative for dizziness and headaches.  PSYCHIATRIC: Negative for sleep disturbance and to question depression screen.  Negative for depression, negative for anhedonia.        Objective:  PHYSICAL EXAM:   VS: BP 108/72 (BP Location: Left Arm, Patient Position: Sitting, Cuff Size: Large)   Pulse 87   Temp (!) 97.1 F (  36.2 C) (Temporal)   Ht 5' 2"  (1.575 m)   Wt 246 lb 9.6 oz (111.9 kg)   SpO2 95%   BMI 45.10 kg/m   GEN: Well nourished, well developed, in no acute distress   Cardiac: RRR; no murmurs, rubs, or gallops,no edema -  Respiratory:  normal respiratory rate and pattern with no distress - normal breath sounds with no rales, rhonchi, wheezes or rubs  MS: no deformity or atrophy  Skin: warm and dry, no rash  Neuro:  Alert and Oriented x 3,- CN II-Xii grossly intact Psych: euthymic mood, appropriate affect and demeanor  Health Maintenance Due  Topic Date Due   Zoster Vaccines- Shingrix (1 of 2) Never done   INFLUENZA VACCINE  04/01/2022    There are no preventive care reminders to display for this patient.  Lab Results  Component Value Date   TSH 1.560 10/01/2021   Lab Results  Component Value Date   WBC 6.6 11/07/2021   HGB 15.9 11/07/2021   HCT 46.8 (H) 11/07/2021   MCV 93 11/07/2021   PLT 319 11/07/2021   Lab Results  Component Value Date   NA 144 03/26/2022   K 4.3 03/26/2022   CO2 21 03/26/2022   GLUCOSE 91 03/26/2022   BUN 9 03/26/2022   CREATININE 0.96 03/26/2022   BILITOT 0.3 11/07/2021   ALKPHOS 172 (H) 11/07/2021   AST 28 11/07/2021   ALT 32 11/07/2021   PROT 7.2 11/07/2021   ALBUMIN 4.3 11/07/2021   CALCIUM 9.3 03/26/2022   ANIONGAP 9  08/25/2021   EGFR 70 03/26/2022   Lab Results  Component Value Date   CHOL 202 (H) 11/07/2021   Lab Results  Component Value Date   HDL 40 11/07/2021   Lab Results  Component Value Date   LDLCALC 122 (H) 11/07/2021   Lab Results  Component Value Date   TRIG 228 (H) 11/07/2021   Lab Results  Component Value Date   CHOLHDL 5.1 (H) 11/07/2021   Lab Results  Component Value Date   HGBA1C 5.9 (H) 11/07/2021      Assessment & Plan:   Problem List Items Addressed This Visit       Cardiovascular and Mediastinum   Benign hypertension - Primary   Relevant Orders   CBC with Differential/Platelet   Comprehensive metabolic panel   TSH Continue meds Follow up with cardiology as directed     Digestive   Gastroesophageal reflux disease without esophagitis Continue meds as directed     Other   Anxiety Continue meds as directed Follow up with therapist    Vitamin D deficiency   Relevant Orders   VITAMIN D 25 Hydroxy (Vit-D Deficiency, Fractures)   Mixed hyperlipidemia   Relevant Orders   Lipid panel Continue meds Watch diet  Prediabetes Watch diet Hgb a1c pending         Follow-up: Return in about 3 months (around 07/31/2022) for chronic fasting follow up.    SARA R Chauna Osoria, PA-C

## 2022-05-01 ENCOUNTER — Other Ambulatory Visit: Payer: Self-pay | Admitting: Physician Assistant

## 2022-05-01 ENCOUNTER — Encounter: Payer: Self-pay | Admitting: Physician Assistant

## 2022-05-01 LAB — CBC WITH DIFFERENTIAL/PLATELET
Basophils Absolute: 0.1 10*3/uL (ref 0.0–0.2)
Basos: 1 %
EOS (ABSOLUTE): 0.3 10*3/uL (ref 0.0–0.4)
Eos: 4 %
Hematocrit: 42.7 % (ref 34.0–46.6)
Hemoglobin: 14.8 g/dL (ref 11.1–15.9)
Immature Grans (Abs): 0 10*3/uL (ref 0.0–0.1)
Immature Granulocytes: 0 %
Lymphocytes Absolute: 2.1 10*3/uL (ref 0.7–3.1)
Lymphs: 27 %
MCH: 33 pg (ref 26.6–33.0)
MCHC: 34.7 g/dL (ref 31.5–35.7)
MCV: 95 fL (ref 79–97)
Monocytes Absolute: 0.7 10*3/uL (ref 0.1–0.9)
Monocytes: 9 %
Neutrophils Absolute: 4.6 10*3/uL (ref 1.4–7.0)
Neutrophils: 59 %
Platelets: 307 10*3/uL (ref 150–450)
RBC: 4.49 x10E6/uL (ref 3.77–5.28)
RDW: 12.8 % (ref 11.7–15.4)
WBC: 7.8 10*3/uL (ref 3.4–10.8)

## 2022-05-01 LAB — LIPID PANEL
Chol/HDL Ratio: 5.5 ratio — ABNORMAL HIGH (ref 0.0–4.4)
Cholesterol, Total: 226 mg/dL — ABNORMAL HIGH (ref 100–199)
HDL: 41 mg/dL (ref >39)
LDL Chol Calc (NIH): 126 mg/dL — ABNORMAL HIGH (ref 0–99)
Triglycerides: 333 mg/dL — ABNORMAL HIGH (ref 0–149)
VLDL Cholesterol Cal: 59 mg/dL — ABNORMAL HIGH (ref 5–40)

## 2022-05-01 LAB — TSH: TSH: 3.63 u[IU]/mL (ref 0.450–4.500)

## 2022-05-01 LAB — COMPREHENSIVE METABOLIC PANEL
ALT: 28 IU/L (ref 0–32)
AST: 26 IU/L (ref 0–40)
Albumin/Globulin Ratio: 1.3 (ref 1.2–2.2)
Albumin: 4.1 g/dL (ref 3.8–4.9)
Alkaline Phosphatase: 170 IU/L — ABNORMAL HIGH (ref 44–121)
BUN/Creatinine Ratio: 13 (ref 9–23)
BUN: 12 mg/dL (ref 6–24)
Bilirubin Total: 0.2 mg/dL (ref 0.0–1.2)
CO2: 19 mmol/L — ABNORMAL LOW (ref 20–29)
Calcium: 9.4 mg/dL (ref 8.7–10.2)
Chloride: 108 mmol/L — ABNORMAL HIGH (ref 96–106)
Creatinine, Ser: 0.89 mg/dL (ref 0.57–1.00)
Globulin, Total: 3.2 g/dL (ref 1.5–4.5)
Glucose: 121 mg/dL — ABNORMAL HIGH (ref 70–99)
Potassium: 3.8 mmol/L (ref 3.5–5.2)
Sodium: 145 mmol/L — ABNORMAL HIGH (ref 134–144)
Total Protein: 7.3 g/dL (ref 6.0–8.5)
eGFR: 77 mL/min/{1.73_m2} (ref >59)

## 2022-05-01 LAB — CARDIOVASCULAR RISK ASSESSMENT

## 2022-05-01 LAB — HEMOGLOBIN A1C
Est. average glucose Bld gHb Est-mCnc: 123 mg/dL
Hgb A1c MFr Bld: 5.9 % — ABNORMAL HIGH (ref 4.8–5.6)

## 2022-05-01 LAB — VITAMIN D 25 HYDROXY (VIT D DEFICIENCY, FRACTURES): Vit D, 25-Hydroxy: 40.8 ng/mL (ref 30.0–100.0)

## 2022-05-01 MED ORDER — ATORVASTATIN CALCIUM 80 MG PO TABS: 80.0000 mg | ORAL_TABLET | Freq: Every day | ORAL | 1 refills | Status: DC

## 2022-05-02 ENCOUNTER — Encounter: Payer: Self-pay | Admitting: Physician Assistant

## 2022-05-08 ENCOUNTER — Encounter: Payer: Self-pay | Admitting: Physician Assistant

## 2022-05-12 ENCOUNTER — Other Ambulatory Visit: Payer: Self-pay | Admitting: Family Medicine

## 2022-05-16 ENCOUNTER — Other Ambulatory Visit: Payer: Self-pay | Admitting: Family Medicine

## 2022-05-16 DIAGNOSIS — E559 Vitamin D deficiency, unspecified: Secondary | ICD-10-CM

## 2022-05-19 ENCOUNTER — Ambulatory Visit: Payer: 59 | Admitting: Physician Assistant

## 2022-05-20 ENCOUNTER — Ambulatory Visit: Payer: 59 | Admitting: Physician Assistant

## 2022-05-26 ENCOUNTER — Other Ambulatory Visit: Payer: Self-pay | Admitting: Physician Assistant

## 2022-05-26 ENCOUNTER — Other Ambulatory Visit: Payer: Self-pay | Admitting: Family Medicine

## 2022-05-26 DIAGNOSIS — F419 Anxiety disorder, unspecified: Secondary | ICD-10-CM

## 2022-06-01 ENCOUNTER — Other Ambulatory Visit: Payer: Self-pay | Admitting: Physician Assistant

## 2022-06-02 ENCOUNTER — Ambulatory Visit (INDEPENDENT_AMBULATORY_CARE_PROVIDER_SITE_OTHER): Payer: 59

## 2022-06-02 ENCOUNTER — Encounter: Payer: Self-pay | Admitting: Cardiology

## 2022-06-02 DIAGNOSIS — Z23 Encounter for immunization: Secondary | ICD-10-CM

## 2022-06-05 DIAGNOSIS — S8002XA Contusion of left knee, initial encounter: Secondary | ICD-10-CM | POA: Diagnosis not present

## 2022-06-05 DIAGNOSIS — R519 Headache, unspecified: Secondary | ICD-10-CM | POA: Diagnosis not present

## 2022-06-05 DIAGNOSIS — Z043 Encounter for examination and observation following other accident: Secondary | ICD-10-CM | POA: Diagnosis not present

## 2022-06-05 DIAGNOSIS — M25562 Pain in left knee: Secondary | ICD-10-CM | POA: Diagnosis not present

## 2022-06-05 DIAGNOSIS — W19XXXA Unspecified fall, initial encounter: Secondary | ICD-10-CM | POA: Diagnosis not present

## 2022-06-10 ENCOUNTER — Other Ambulatory Visit: Payer: Self-pay | Admitting: Physician Assistant

## 2022-06-10 ENCOUNTER — Other Ambulatory Visit: Payer: Self-pay | Admitting: Cardiology

## 2022-06-10 DIAGNOSIS — K219 Gastro-esophageal reflux disease without esophagitis: Secondary | ICD-10-CM

## 2022-06-25 DIAGNOSIS — L739 Follicular disorder, unspecified: Secondary | ICD-10-CM | POA: Diagnosis not present

## 2022-06-25 DIAGNOSIS — N898 Other specified noninflammatory disorders of vagina: Secondary | ICD-10-CM | POA: Diagnosis not present

## 2022-06-26 ENCOUNTER — Other Ambulatory Visit: Payer: Self-pay | Admitting: Physician Assistant

## 2022-06-26 ENCOUNTER — Encounter: Payer: Self-pay | Admitting: Physician Assistant

## 2022-06-26 MED ORDER — BENZONATATE 100 MG PO CAPS: 100.0000 mg | ORAL_CAPSULE | Freq: Two times a day (BID) | ORAL | 1 refills | Status: DC | PRN

## 2022-06-26 NOTE — Telephone Encounter (Signed)
Will send in 

## 2022-07-29 DIAGNOSIS — G43709 Chronic migraine without aura, not intractable, without status migrainosus: Secondary | ICD-10-CM | POA: Diagnosis not present

## 2022-07-31 ENCOUNTER — Other Ambulatory Visit: Payer: Self-pay | Admitting: Family Medicine

## 2022-08-01 ENCOUNTER — Encounter: Payer: Self-pay | Admitting: Cardiology

## 2022-08-01 NOTE — Telephone Encounter (Signed)
Left message for patient call back. Recommended that if she is short of breath, to go to the emergency room.

## 2022-08-06 ENCOUNTER — Ambulatory Visit: Payer: 59 | Admitting: Physician Assistant

## 2022-08-06 ENCOUNTER — Encounter: Payer: Self-pay | Admitting: Physician Assistant

## 2022-08-06 VITALS — BP 104/76 | HR 100 | Temp 97.5°F | Ht 62.0 in | Wt 242.6 lb

## 2022-08-06 DIAGNOSIS — K219 Gastro-esophageal reflux disease without esophagitis: Secondary | ICD-10-CM

## 2022-08-06 DIAGNOSIS — E782 Mixed hyperlipidemia: Secondary | ICD-10-CM

## 2022-08-06 DIAGNOSIS — I10 Essential (primary) hypertension: Secondary | ICD-10-CM | POA: Diagnosis not present

## 2022-08-06 DIAGNOSIS — Z23 Encounter for immunization: Secondary | ICD-10-CM | POA: Insufficient documentation

## 2022-08-06 DIAGNOSIS — E559 Vitamin D deficiency, unspecified: Secondary | ICD-10-CM | POA: Diagnosis not present

## 2022-08-06 DIAGNOSIS — R7303 Prediabetes: Secondary | ICD-10-CM | POA: Diagnosis not present

## 2022-08-06 DIAGNOSIS — F419 Anxiety disorder, unspecified: Secondary | ICD-10-CM

## 2022-08-06 DIAGNOSIS — R69 Illness, unspecified: Secondary | ICD-10-CM | POA: Diagnosis not present

## 2022-08-06 HISTORY — DX: Encounter for immunization: Z23

## 2022-08-06 NOTE — Progress Notes (Signed)
Established Patient Office Visit  Subjective:  Patient ID: Cindy Hammond, female    DOB: 02/05/1966  Age: 56 y.o. MRN: [id number]  CC:  Chief Complaint  Patient presents with   Hypertension    HPI Cindy Hammond presents for follow up hyperlipidemia and chronic issues  Mixed hyperlipidemia  Pt presents with hyperlipidemia.  Compliance with treatment has been good; The patient is compliant with medications, maintains a low cholesterol diet , follows up as directed , . The patient denies experiencing any hypercholesterolemia related symptoms.  Evidenced based information based on history , exam, and other sources has been used  for decision making. She is currently taking lipitor 62m qd  Pt presents for follow up of hypertension.  The patient is tolerating the medication well without side effects. Compliance with treatment has been good; including taking medication as directed ,    She is currently on atenolol 25 mg qd and lasix 444mqd- she denies chest pain or shortness of breath Pt follows with Dr ToHarriet Massonardiologist regularly who has her taking Ranexa and use nitrrostat as needed.  She has follow up appt in the next month  Pt with history of GERD - stable on omeprazole 4072md  Pt with history of anxiety - states her symptoms controlled well with clonazepam, amitriptyline,seroquel and buspar - voices no problems or concerns - she states she has started seeing a therapist as well - JarTacy Durat currently taking Vit D weekly - due for labwork  Pt with history of migraines - currently stable on topamax and ubrelvy- she follows regularly with amy KeaTeryl Lucy  Patient has history of prediabetes - states at this time is not really watching diet well but trying  Pt would like to get shingles vaccine  Past Medical History:  Diagnosis Date   Anxiety    Depression    GERD (gastroesophageal reflux disease)    Hyperlipidemia    Hypertension    Pain in joint, lower leg    Thrombocytopenia  (HCC)     Past Surgical History:  Procedure Laterality Date   TUBAL LIGATION      Family History  Problem Relation Age of Onset   Hyperlipidemia Mother    Hypertension Mother    Hyperlipidemia Father    Hypertension Father     Social History   Socioeconomic History   Marital status: Married    Spouse name: Not on file   Number of children: 3   Years of education: Not on file   Highest education level: Not on file  Occupational History   Occupation: Unemployed  Tobacco Use   Smoking status: Former    Packs/day: 0.50    Years: 10.00    Total pack years: 5.00    Types: Cigarettes    Quit date: 2000    Years since quitting: 23.9   Smokeless tobacco: Never  Vaping Use   Vaping Use: Never used  Substance and Sexual Activity   Alcohol use: No    Alcohol/week: 0.0 standard drinks of alcohol   Drug use: No   Sexual activity: Yes    Partners: Male  Other Topics Concern   Not on file  Social History Narrative   Not on file   Social Determinants of Health   Financial Resource Strain: Not on file  Food Insecurity: Not on file  Transportation Needs: Not on file  Physical Activity: Not on file  Stress: Not on file  Social Connections: Not on file  Intimate Partner Violence: Not on file     Current Outpatient Medications:    albuterol (VENTOLIN HFA) 108 (90 Base) MCG/ACT inhaler, Inhale 1-2 puffs into the lungs every 6 (six) hours as needed for wheezing or shortness of breath., Disp: 1 each, Rfl: 2   amitriptyline (ELAVIL) 150 MG tablet, TAKE 1 TABLET BY MOUTH DAILY AT BEDTIME., Disp: 90 tablet, Rfl: 1   atorvastatin (LIPITOR) 80 MG tablet, Take 1 tablet (80 mg total) by mouth daily., Disp: 90 tablet, Rfl: 1   benzonatate (TESSALON) 100 MG capsule, Take 1 capsule (100 mg total) by mouth 2 (two) times daily as needed for cough., Disp: 30 capsule, Rfl: 1   busPIRone (BUSPAR) 30 MG tablet, Take 1 tablet (30 mg total) by mouth 2 (two) times daily., Disp: 180 tablet, Rfl:  1   clonazePAM (KLONOPIN) 0.5 MG tablet, TAKE ONE TABLET BY MOUTH TWICE DAILY, Disp: 60 tablet, Rfl: 2   dicyclomine (BENTYL) 20 MG tablet, TAKE 1 TABLET BY MOUTH THREE(3) TIMES DAILY BEFORE MEALS, Disp: 90 tablet, Rfl: 1   KRILL OIL PO, Take 1 capsule by mouth 2 (two) times daily., Disp: , Rfl:    nitroGLYCERIN (NITROSTAT) 0.4 MG SL tablet, PLACE 1 TABLET UNDER THE TONGUE AS NEEDED FOR CHEST PAIN ( MAY REPEAT EVERY5 MINUTES X 3), Disp: 25 tablet, Rfl: 3   omeprazole (PRILOSEC) 40 MG capsule, TAKE 1 CAPSULE BY MOUTH ONCE DAILY., Disp: 90 capsule, Rfl: 1   pregabalin (LYRICA) 75 MG capsule, Take 1 capsule by mouth 3 times daily., Disp: 270 capsule, Rfl: 0   QUEtiapine (SEROQUEL) 300 MG tablet, TAKE 1 TABLET BY MOUTH AT BEDTIME, Disp: 90 tablet, Rfl: 1   ranolazine (RANEXA) 500 MG 12 hr tablet, Take 1 tablet (500 mg total) by mouth 2 (two) times daily., Disp: 60 tablet, Rfl: 11   topiramate (TOPAMAX) 100 MG tablet, TAKE ONE (1) TABLET BY MOUTH 3 TIMES DAILY, Disp: 270 tablet, Rfl: 1   Ubrogepant (UBRELVY) 100 MG TABS, Take 100 mg by mouth 2 (two) times daily as needed (migraine)., Disp: , Rfl:    Vitamin D, Ergocalciferol, (DRISDOL) 1.25 MG (50000 UNIT) CAPS capsule, TAKE 1 CAPSULE BY MOUTH TWICE A WEEK, Disp: 10 capsule, Rfl: 1   atenolol (TENORMIN) 25 MG tablet, Take 1 tablet (25 mg total) by mouth 2 (two) times daily. (Patient taking differently: Take 50 mg by mouth every morning.), Disp: 180 tablet, Rfl: 3   furosemide (LASIX) 40 MG tablet, Take 1 tablet (40 mg total) by mouth daily., Disp: 90 tablet, Rfl: 3   Allergies  Allergen Reactions   Sumatriptan Hives and Rash   Sulfa Antibiotics    CONSTITUTIONAL: Negative for chills, fatigue, fever, unintentional weight gain and unintentional weight loss.  E/N/T: Negative for ear pain, nasal congestion and sore throat.  CARDIOVASCULAR: Negative for chest pain, dizziness, palpitations and pedal edema.  RESPIRATORY: Negative for recent cough and  dyspnea.  GASTROINTESTINAL: Negative for abdominal pain, acid reflux symptoms, constipation, diarrhea, nausea and vomiting.  MSK: Negative for arthralgias and myalgias.  INTEGUMENTARY: Negative for rash.  NEUROLOGICAL: Negative for dizziness and headaches.  PSYCHIATRIC: Negative for sleep disturbance and to question depression screen.  Negative for depression, negative for anhedonia.            Objective:  PHYSICAL EXAM:   VS: BP 104/76 (BP Location: Left Arm, Patient Position: Sitting, Cuff Size: Large)   Pulse 100   Temp (!) 97.5 F (36.4 C) (Temporal)   Ht 5'  2" (1.575 m)   Wt 242 lb 9.6 oz (110 kg)   SpO2 96%   BMI 44.37 kg/m   GEN: Well nourished, well developed, in no acute distress  Cardiac: RRR; no murmurs, rubs, or gallops,no edema -  Respiratory:  normal respiratory rate and pattern with no distress - normal breath sounds with no rales, rhonchi, wheezes or rubs MS: no deformity or atrophy  Skin: warm and dry, no rash  Neuro:  Alert and Oriented x 3,  - CN II-Xii grossly intact Psych: euthymic mood, appropriate affect and demeanor   Health Maintenance Due  Topic Date Due   Zoster Vaccines- Shingrix (1 of 2) Never done   PAP SMEAR-Modifier  Never done    There are no preventive care reminders to display for this patient.  Lab Results  Component Value Date   TSH 3.630 04/30/2022   Lab Results  Component Value Date   WBC 7.8 04/30/2022   HGB 14.8 04/30/2022   HCT 42.7 04/30/2022   MCV 95 04/30/2022   PLT 307 04/30/2022   Lab Results  Component Value Date   NA 145 (H) 04/30/2022   K 3.8 04/30/2022   CO2 19 (L) 04/30/2022   GLUCOSE 121 (H) 04/30/2022   BUN 12 04/30/2022   CREATININE 0.89 04/30/2022   BILITOT 0.2 04/30/2022   ALKPHOS 170 (H) 04/30/2022   AST 26 04/30/2022   ALT 28 04/30/2022   PROT 7.3 04/30/2022   ALBUMIN 4.1 04/30/2022   CALCIUM 9.4 04/30/2022   ANIONGAP 9 08/25/2021   EGFR 77 04/30/2022   Lab Results  Component Value Date    CHOL 226 (H) 04/30/2022   Lab Results  Component Value Date   HDL 41 04/30/2022   Lab Results  Component Value Date   LDLCALC 126 (H) 04/30/2022   Lab Results  Component Value Date   TRIG 333 (H) 04/30/2022   Lab Results  Component Value Date   CHOLHDL 5.5 (H) 04/30/2022   Lab Results  Component Value Date   HGBA1C 5.9 (H) 04/30/2022      Assessment & Plan:   Problem List Items Addressed This Visit       Cardiovascular and Mediastinum   Benign hypertension - Primary   Relevant Orders   CBC with Differential/Platelet   Comprehensive metabolic panel   TSH Continue meds Follow up with cardiology as directed     Digestive   Gastroesophageal reflux disease without esophagitis Continue meds as directed     Other   Anxiety Continue meds as directed Follow up with therapist    Vitamin D deficiency   Relevant Orders   VITAMIN D 25 Hydroxy (Vit-D Deficiency, Fractures)   Mixed hyperlipidemia   Relevant Orders   Lipid panel Continue meds Watch diet  Prediabetes Watch diet Hgb a1c pending  Need for shingles vaccine Shingrix given - #1         Follow-up: Return in about 3 months (around 11/05/2022) for chronic fasting follow up.    SARA R Inger Wiest, PA-C

## 2022-08-07 ENCOUNTER — Encounter: Payer: Self-pay | Admitting: Physician Assistant

## 2022-08-07 LAB — LIPID PANEL
Chol/HDL Ratio: 4.7 ratio — ABNORMAL HIGH (ref 0.0–4.4)
Cholesterol, Total: 196 mg/dL (ref 100–199)
HDL: 42 mg/dL (ref >39)
LDL Chol Calc (NIH): 107 mg/dL — ABNORMAL HIGH (ref 0–99)
Triglycerides: 272 mg/dL — ABNORMAL HIGH (ref 0–149)
VLDL Cholesterol Cal: 47 mg/dL — ABNORMAL HIGH (ref 5–40)

## 2022-08-07 LAB — CBC WITH DIFFERENTIAL/PLATELET
Basophils Absolute: 0.1 10*3/uL (ref 0.0–0.2)
Basos: 1 %
EOS (ABSOLUTE): 0.3 10*3/uL (ref 0.0–0.4)
Eos: 3 %
Hematocrit: 46.6 % (ref 34.0–46.6)
Hemoglobin: 15.6 g/dL (ref 11.1–15.9)
Immature Grans (Abs): 0 10*3/uL (ref 0.0–0.1)
Immature Granulocytes: 1 %
Lymphocytes Absolute: 1.5 10*3/uL (ref 0.7–3.1)
Lymphs: 19 %
MCH: 32.2 pg (ref 26.6–33.0)
MCHC: 33.5 g/dL (ref 31.5–35.7)
MCV: 96 fL (ref 79–97)
Monocytes Absolute: 0.6 10*3/uL (ref 0.1–0.9)
Monocytes: 7 %
Neutrophils Absolute: 5.6 10*3/uL (ref 1.4–7.0)
Neutrophils: 69 %
Platelets: 303 10*3/uL (ref 150–450)
RBC: 4.85 x10E6/uL (ref 3.77–5.28)
RDW: 12.3 % (ref 11.7–15.4)
WBC: 8 10*3/uL (ref 3.4–10.8)

## 2022-08-07 LAB — COMPREHENSIVE METABOLIC PANEL
ALT: 33 IU/L — ABNORMAL HIGH (ref 0–32)
AST: 25 IU/L (ref 0–40)
Albumin/Globulin Ratio: 1.3 (ref 1.2–2.2)
Albumin: 4.2 g/dL (ref 3.8–4.9)
Alkaline Phosphatase: 185 IU/L — ABNORMAL HIGH (ref 44–121)
BUN/Creatinine Ratio: 12 (ref 9–23)
BUN: 11 mg/dL (ref 6–24)
Bilirubin Total: 0.5 mg/dL (ref 0.0–1.2)
CO2: 21 mmol/L (ref 20–29)
Calcium: 9 mg/dL (ref 8.7–10.2)
Chloride: 106 mmol/L (ref 96–106)
Creatinine, Ser: 0.91 mg/dL (ref 0.57–1.00)
Globulin, Total: 3.2 g/dL (ref 1.5–4.5)
Glucose: 117 mg/dL — ABNORMAL HIGH (ref 70–99)
Potassium: 3.9 mmol/L (ref 3.5–5.2)
Sodium: 140 mmol/L (ref 134–144)
Total Protein: 7.4 g/dL (ref 6.0–8.5)
eGFR: 75 mL/min/{1.73_m2} (ref >59)

## 2022-08-07 LAB — CARDIOVASCULAR RISK ASSESSMENT

## 2022-08-07 LAB — TSH: TSH: 2.43 u[IU]/mL (ref 0.450–4.500)

## 2022-08-07 LAB — HEMOGLOBIN A1C
Est. average glucose Bld gHb Est-mCnc: 128 mg/dL
Hgb A1c MFr Bld: 6.1 % — ABNORMAL HIGH (ref 4.8–5.6)

## 2022-08-07 LAB — VITAMIN D 25 HYDROXY (VIT D DEFICIENCY, FRACTURES): Vit D, 25-Hydroxy: 45.5 ng/mL (ref 30.0–100.0)

## 2022-08-08 ENCOUNTER — Other Ambulatory Visit: Payer: Self-pay

## 2022-08-12 DIAGNOSIS — Z01419 Encounter for gynecological examination (general) (routine) without abnormal findings: Secondary | ICD-10-CM | POA: Diagnosis not present

## 2022-08-12 DIAGNOSIS — N898 Other specified noninflammatory disorders of vagina: Secondary | ICD-10-CM | POA: Diagnosis not present

## 2022-08-21 ENCOUNTER — Encounter: Payer: Self-pay | Admitting: Physician Assistant

## 2022-08-27 ENCOUNTER — Ambulatory Visit: Payer: 59 | Admitting: Family Medicine

## 2022-09-03 ENCOUNTER — Other Ambulatory Visit: Payer: Self-pay | Admitting: Physician Assistant

## 2022-09-03 DIAGNOSIS — E559 Vitamin D deficiency, unspecified: Secondary | ICD-10-CM

## 2022-09-03 DIAGNOSIS — F419 Anxiety disorder, unspecified: Secondary | ICD-10-CM

## 2022-09-15 ENCOUNTER — Encounter: Payer: Self-pay | Admitting: Physician Assistant

## 2022-09-15 ENCOUNTER — Other Ambulatory Visit: Payer: Self-pay

## 2022-09-30 ENCOUNTER — Encounter: Payer: Self-pay | Admitting: Cardiology

## 2022-09-30 ENCOUNTER — Other Ambulatory Visit: Payer: Self-pay | Admitting: Cardiology

## 2022-09-30 ENCOUNTER — Other Ambulatory Visit: Payer: Self-pay | Admitting: Family Medicine

## 2022-09-30 ENCOUNTER — Other Ambulatory Visit: Payer: Self-pay | Admitting: Physician Assistant

## 2022-09-30 DIAGNOSIS — F419 Anxiety disorder, unspecified: Secondary | ICD-10-CM

## 2022-09-30 MED ORDER — ATENOLOL 25 MG PO TABS: 25.0000 mg | ORAL_TABLET | Freq: Two times a day (BID) | ORAL | 3 refills | Status: DC

## 2022-10-03 ENCOUNTER — Encounter: Payer: Self-pay | Admitting: Physician Assistant

## 2022-10-05 ENCOUNTER — Encounter: Payer: Self-pay | Admitting: Physician Assistant

## 2022-10-06 NOTE — Telephone Encounter (Signed)
Patient called the off about her symptoms in the message that she sent. I spoke with Hoyle Sauer, RN about this patient. I offered her the first available appointment, which was for this Wednesday morning with sally. Patient stated that she doesn't know if she could wait that long. Patient notified that if she feels that bad it might be best to go to UC again to be see for these symptoms. Patient stated she is going to go somewhere to be seen.

## 2022-10-14 ENCOUNTER — Other Ambulatory Visit (HOSPITAL_COMMUNITY): Payer: Self-pay

## 2022-10-28 ENCOUNTER — Other Ambulatory Visit: Payer: Self-pay | Admitting: Physician Assistant

## 2022-10-28 ENCOUNTER — Other Ambulatory Visit (HOSPITAL_COMMUNITY): Payer: Self-pay

## 2022-11-11 ENCOUNTER — Encounter: Payer: Self-pay | Admitting: Physician Assistant

## 2022-11-11 ENCOUNTER — Ambulatory Visit: Payer: Medicaid Other | Admitting: Physician Assistant

## 2022-11-11 VITALS — BP 118/84 | HR 95 | Temp 97.5°F | Ht 62.0 in | Wt 248.2 lb

## 2022-11-11 DIAGNOSIS — E559 Vitamin D deficiency, unspecified: Secondary | ICD-10-CM | POA: Diagnosis not present

## 2022-11-11 DIAGNOSIS — I10 Essential (primary) hypertension: Secondary | ICD-10-CM

## 2022-11-11 DIAGNOSIS — F419 Anxiety disorder, unspecified: Secondary | ICD-10-CM

## 2022-11-11 DIAGNOSIS — Z1231 Encounter for screening mammogram for malignant neoplasm of breast: Secondary | ICD-10-CM

## 2022-11-11 DIAGNOSIS — M797 Fibromyalgia: Secondary | ICD-10-CM

## 2022-11-11 DIAGNOSIS — R7303 Prediabetes: Secondary | ICD-10-CM

## 2022-11-11 DIAGNOSIS — E782 Mixed hyperlipidemia: Secondary | ICD-10-CM

## 2022-11-11 DIAGNOSIS — K219 Gastro-esophageal reflux disease without esophagitis: Secondary | ICD-10-CM | POA: Diagnosis not present

## 2022-11-11 NOTE — Progress Notes (Signed)
Established Patient Office Visit  Subjective:  Patient ID: Cindy Hammond, female    DOB: [birth year]  Age: 57 y.o. MRN: [id number]  CC:  Chief Complaint  Patient presents with   Hypertension   Hyperlipidemia    HPI Cindy Hammond presents for follow up hyperlipidemia and chronic issues  Mixed hyperlipidemia  Pt presents with hyperlipidemia.  Compliance with treatment has been good; The patient is compliant with medications, maintains a low cholesterol diet , follows up as directed , . The patient denies experiencing any hypercholesterolemia related symptoms.  Evidenced based information based on history , exam, and other sources has been used  for decision making. She is currently taking lipitor '80mg'$  qd  Pt presents for follow up of hypertension.  The patient is tolerating the medication well without side effects. Compliance with treatment has been good; including taking medication as directed ,    She is currently on atenolol 25 mg qd and lasix '40mg'$  qd- she denies chest pain or shortness of breath Pt follows with Dr Harriet Masson cardiologist regularly who has her taking Ranexa and use nitrrostat as needed.  She has follow up appt in the next few months  Pt with history of GERD - stable on omeprazole '40mg'$  qd  Pt with history of anxiety - states her symptoms controlled well with clonazepam, amitriptyline,seroquel and buspar - voices no problems or concerns - she states she is seeing  a therapist regularly- Tacy Dura  Pt currently taking Vit D weekly - due for labwork  Pt with history of migraines - currently stable on topamax and ubrelvy- she follows regularly with amy Teryl Lucy NP  Patient has history of prediabetes - states at this time is not really watching diet well but trying- due to repeat hgb a1c  Pt is due for mammogram  Past Medical History:  Diagnosis Date   Anxiety    Depression    GERD (gastroesophageal reflux disease)    Hyperlipidemia    Hypertension    Pain in joint, lower  leg    Thrombocytopenia (HCC)     Past Surgical History:  Procedure Laterality Date   TUBAL LIGATION      Family History  Problem Relation Age of Onset   Hyperlipidemia Mother    Hypertension Mother    Hyperlipidemia Father    Hypertension Father     Social History   Socioeconomic History   Marital status: Married    Spouse name: Not on file   Number of children: 3   Years of education: Not on file   Highest education level: Not on file  Occupational History   Occupation: Unemployed  Tobacco Use   Smoking status: Former    Packs/day: 0.50    Years: 10.00    Total pack years: 5.00    Types: Cigarettes    Quit date: 2000    Years since quitting: 24.2   Smokeless tobacco: Never  Vaping Use   Vaping Use: Never used  Substance and Sexual Activity   Alcohol use: No    Alcohol/week: 0.0 standard drinks of alcohol   Drug use: No   Sexual activity: Yes    Partners: Male  Other Topics Concern   Not on file  Social History Narrative   Not on file   Social Determinants of Health   Financial Resource Strain: Not on file  Food Insecurity: Not on file  Transportation Needs: Not on file  Physical Activity: Not on file  Stress: Not on file  Social  Connections: Not on file  Intimate Partner Violence: Not on file     Current Outpatient Medications:    albuterol (VENTOLIN HFA) 108 (90 Base) MCG/ACT inhaler, INHALE 1-2 PUFFS INTO THE LUNGS EVERY 6 HOURS AS NEEDED FOR WHEEZING OR SHORTNESS OF BREATH, Disp: 8.5 g, Rfl: 2   amitriptyline (ELAVIL) 150 MG tablet, TAKE 1 TABLET BY MOUTH DAILY AT BEDTIME., Disp: 90 tablet, Rfl: 1   atenolol (TENORMIN) 25 MG tablet, Take 1 tablet (25 mg total) by mouth 2 (two) times daily., Disp: 180 tablet, Rfl: 3   atorvastatin (LIPITOR) 80 MG tablet, Take 1 tablet (80 mg total) by mouth daily., Disp: 90 tablet, Rfl: 1   benzonatate (TESSALON) 100 MG capsule, Take 1 capsule (100 mg total) by mouth 2 (two) times daily as needed for cough., Disp:  30 capsule, Rfl: 1   busPIRone (BUSPAR) 30 MG tablet, Take 1 tablet (30 mg total) by mouth 2 (two) times daily., Disp: 180 tablet, Rfl: 1   clonazePAM (KLONOPIN) 0.5 MG tablet, Take 1 tablet by mouth twice a day., Disp: 60 tablet, Rfl: 2   dicyclomine (BENTYL) 20 MG tablet, TAKE 1 TABLET BY MOUTH THREE(3) TIMES DAILY BEFORE MEALS, Disp: 90 tablet, Rfl: 1   KRILL OIL PO, Take 1 capsule by mouth 2 (two) times daily., Disp: , Rfl:    nitroGLYCERIN (NITROSTAT) 0.4 MG SL tablet, PLACE 1 TABLET UNDER THE TONGUE AS NEEDED FOR CHEST PAIN ( MAY REPEAT EVERY5 MINUTES X 3), Disp: 25 tablet, Rfl: 3   omeprazole (PRILOSEC) 40 MG capsule, TAKE 1 CAPSULE BY MOUTH ONCE DAILY., Disp: 90 capsule, Rfl: 1   pregabalin (LYRICA) 75 MG capsule, Take 1 capsule by mouth 3 times a day., Disp: 270 capsule, Rfl: 0   QUEtiapine (SEROQUEL) 300 MG tablet, TAKE 1 TABLET BY MOUTH AT BEDTIME, Disp: 90 tablet, Rfl: 1   ranolazine (RANEXA) 500 MG 12 hr tablet, TAKE 1 TABLET BY MOUTH TWICE(2) DAILY, Disp: 60 tablet, Rfl: 11   topiramate (TOPAMAX) 100 MG tablet, Take 1 tablet by mouth 3 times daily., Disp: 270 tablet, Rfl: 1   Ubrogepant (UBRELVY) 100 MG TABS, Take 100 mg by mouth 2 (two) times daily as needed (migraine)., Disp: , Rfl:    Vitamin D, Ergocalciferol, 50000 units CAPS, TAKE 1 CAPSULE BY MOUTH TWICE A WEEK, Disp: 10 capsule, Rfl: 2   furosemide (LASIX) 40 MG tablet, Take 1 tablet (40 mg total) by mouth daily., Disp: 90 tablet, Rfl: 3   Allergies  Allergen Reactions   Sumatriptan Hives and Rash   Sulfa Antibiotics    CONSTITUTIONAL: Negative for chills, fatigue, fever, unintentional weight gain and unintentional weight loss.  E/N/T: Negative for ear pain, nasal congestion and sore throat.  CARDIOVASCULAR: Negative for chest pain, dizziness, palpitations and pedal edema.  RESPIRATORY: Negative for recent cough and dyspnea.  GASTROINTESTINAL: Negative for abdominal pain, acid reflux symptoms, constipation, diarrhea,  nausea and vomiting.  MSK: Negative for arthralgias and myalgias.  INTEGUMENTARY: Negative for rash.  NEUROLOGICAL: Negative for dizziness and headaches.  PSYCHIATRIC: Negative for sleep disturbance and to question depression screen.  Negative for depression, negative for anhedonia.         Objective:  PHYSICAL EXAM:   VS: BP 118/84 (BP Location: Left Arm, Patient Position: Sitting, Cuff Size: Large)   Pulse 95   Temp (!) 97.5 F (36.4 C) (Temporal)   Ht '5\' 2"'$  (1.575 m)   Wt 248 lb 3.2 oz (112.6 kg)   SpO2 94%  BMI 45.40 kg/m   GEN: Well nourished, well developed, in no acute distress   Cardiac: RRR; no murmurs, rubs, or gallops,no edema -  Respiratory:  normal respiratory rate and pattern with no distress - normal breath sounds with no rales, rhonchi, wheezes or rubs MS: no deformity or atrophy  Skin: warm and dry, no rash  Psych: euthymic mood, appropriate affect and demeanor   Health Maintenance Due  Topic Date Due   PAP SMEAR-Modifier  Never done    There are no preventive care reminders to display for this patient.  Lab Results  Component Value Date   TSH 2.430 08/06/2022   Lab Results  Component Value Date   WBC 8.0 08/06/2022   HGB 15.6 08/06/2022   HCT 46.6 08/06/2022   MCV 96 08/06/2022   PLT 303 08/06/2022   Lab Results  Component Value Date   NA 140 08/06/2022   K 3.9 08/06/2022   CO2 21 08/06/2022   GLUCOSE 117 (H) 08/06/2022   BUN 11 08/06/2022   CREATININE 0.91 08/06/2022   BILITOT 0.5 08/06/2022   ALKPHOS 185 (H) 08/06/2022   AST 25 08/06/2022   ALT 33 (H) 08/06/2022   PROT 7.4 08/06/2022   ALBUMIN 4.2 08/06/2022   CALCIUM 9.0 08/06/2022   ANIONGAP 9 08/25/2021   EGFR 75 08/06/2022   Lab Results  Component Value Date   CHOL 196 08/06/2022   Lab Results  Component Value Date   HDL 42 08/06/2022   Lab Results  Component Value Date   LDLCALC 107 (H) 08/06/2022   Lab Results  Component Value Date   TRIG 272 (H) 08/06/2022    Lab Results  Component Value Date   CHOLHDL 4.7 (H) 08/06/2022   Lab Results  Component Value Date   HGBA1C 6.1 (H) 08/06/2022      Assessment & Plan:   Problem List Items Addressed This Visit       Cardiovascular and Mediastinum   Benign hypertension - Primary   Relevant Orders   CBC with Differential/Platelet   Comprehensive metabolic panel   TSH Continue meds Follow up with cardiology as directed     Digestive   Gastroesophageal reflux disease without esophagitis Continue meds as directed     Other   Anxiety Continue meds as directed Follow up with therapist    Vitamin D deficiency   Relevant Orders   VITAMIN D 25 Hydroxy (Vit-D Deficiency, Fractures)   Mixed hyperlipidemia   Relevant Orders   Lipid panel Continue meds Watch diet  Prediabetes Watch diet Hgb a1c pending  Breast cancer screening Mammogram order given         Follow-up: Return in about 4 months (around 03/13/2023) for chronic fasting follow up.    SARA R Eulises Kijowski, PA-C

## 2022-11-19 ENCOUNTER — Other Ambulatory Visit: Payer: Medicaid Other

## 2022-11-19 DIAGNOSIS — E782 Mixed hyperlipidemia: Secondary | ICD-10-CM

## 2022-11-19 DIAGNOSIS — I10 Essential (primary) hypertension: Secondary | ICD-10-CM | POA: Diagnosis not present

## 2022-11-19 DIAGNOSIS — R7303 Prediabetes: Secondary | ICD-10-CM | POA: Diagnosis not present

## 2022-11-19 DIAGNOSIS — F419 Anxiety disorder, unspecified: Secondary | ICD-10-CM | POA: Diagnosis not present

## 2022-11-19 DIAGNOSIS — E559 Vitamin D deficiency, unspecified: Secondary | ICD-10-CM | POA: Diagnosis not present

## 2022-11-20 LAB — CBC WITH DIFFERENTIAL/PLATELET
Basophils Absolute: 0.1 10*3/uL (ref 0.0–0.2)
Basos: 1 %
EOS (ABSOLUTE): 0.4 10*3/uL (ref 0.0–0.4)
Eos: 5 %
Hematocrit: 44.8 % (ref 34.0–46.6)
Hemoglobin: 15.6 g/dL (ref 11.1–15.9)
Immature Grans (Abs): 0 10*3/uL (ref 0.0–0.1)
Immature Granulocytes: 0 %
Lymphocytes Absolute: 1.7 10*3/uL (ref 0.7–3.1)
Lymphs: 23 %
MCH: 33.3 pg — ABNORMAL HIGH (ref 26.6–33.0)
MCHC: 34.8 g/dL (ref 31.5–35.7)
MCV: 96 fL (ref 79–97)
Monocytes Absolute: 0.7 10*3/uL (ref 0.1–0.9)
Monocytes: 9 %
Neutrophils Absolute: 4.8 10*3/uL (ref 1.4–7.0)
Neutrophils: 62 %
Platelets: 289 10*3/uL (ref 150–450)
RBC: 4.68 x10E6/uL (ref 3.77–5.28)
RDW: 13.4 % (ref 11.7–15.4)
WBC: 7.6 10*3/uL (ref 3.4–10.8)

## 2022-11-20 LAB — COMPREHENSIVE METABOLIC PANEL
ALT: 27 IU/L (ref 0–32)
AST: 20 IU/L (ref 0–40)
Albumin/Globulin Ratio: 1.4 (ref 1.2–2.2)
Albumin: 4.2 g/dL (ref 3.8–4.9)
Alkaline Phosphatase: 214 IU/L — ABNORMAL HIGH (ref 44–121)
BUN/Creatinine Ratio: 14 (ref 9–23)
BUN: 14 mg/dL (ref 6–24)
Bilirubin Total: 0.4 mg/dL (ref 0.0–1.2)
CO2: 21 mmol/L (ref 20–29)
Calcium: 9 mg/dL (ref 8.7–10.2)
Chloride: 106 mmol/L (ref 96–106)
Creatinine, Ser: 0.98 mg/dL (ref 0.57–1.00)
Globulin, Total: 3 g/dL (ref 1.5–4.5)
Glucose: 119 mg/dL — ABNORMAL HIGH (ref 70–99)
Potassium: 4 mmol/L (ref 3.5–5.2)
Sodium: 143 mmol/L (ref 134–144)
Total Protein: 7.2 g/dL (ref 6.0–8.5)
eGFR: 68 mL/min/{1.73_m2} (ref >59)

## 2022-11-20 LAB — TSH: TSH: 3.1 u[IU]/mL (ref 0.450–4.500)

## 2022-11-20 LAB — LIPID PANEL
Chol/HDL Ratio: 5.1 ratio — ABNORMAL HIGH (ref 0.0–4.4)
Cholesterol, Total: 205 mg/dL — ABNORMAL HIGH (ref 100–199)
HDL: 40 mg/dL (ref >39)
LDL Chol Calc (NIH): 119 mg/dL — ABNORMAL HIGH (ref 0–99)
Triglycerides: 263 mg/dL — ABNORMAL HIGH (ref 0–149)
VLDL Cholesterol Cal: 46 mg/dL — ABNORMAL HIGH (ref 5–40)

## 2022-11-20 LAB — HEMOGLOBIN A1C
Est. average glucose Bld gHb Est-mCnc: 126 mg/dL
Hgb A1c MFr Bld: 6 % — ABNORMAL HIGH (ref 4.8–5.6)

## 2022-11-20 LAB — CARDIOVASCULAR RISK ASSESSMENT

## 2022-11-20 LAB — VITAMIN D 25 HYDROXY (VIT D DEFICIENCY, FRACTURES): Vit D, 25-Hydroxy: 47 ng/mL (ref 30.0–100.0)

## 2022-11-25 ENCOUNTER — Encounter: Payer: Self-pay | Admitting: Cardiology

## 2022-11-25 ENCOUNTER — Ambulatory Visit: Payer: Medicaid Other | Attending: Cardiology | Admitting: Cardiology

## 2022-11-25 VITALS — BP 132/78 | HR 79 | Ht 60.0 in | Wt 245.0 lb

## 2022-11-25 DIAGNOSIS — R0602 Shortness of breath: Secondary | ICD-10-CM

## 2022-11-25 DIAGNOSIS — R7303 Prediabetes: Secondary | ICD-10-CM

## 2022-11-25 DIAGNOSIS — I10 Essential (primary) hypertension: Secondary | ICD-10-CM

## 2022-11-25 DIAGNOSIS — E782 Mixed hyperlipidemia: Secondary | ICD-10-CM

## 2022-11-25 NOTE — Patient Instructions (Signed)
Medication Instructions:  Your physician recommends that you continue on your current medications as directed. Please refer to the Current Medication list given to you today.  *If you need a refill on your cardiac medications before your next appointment, please call your pharmacy*   Lab Work: None   Testing/Procedures: Your physician has requested that you have an echocardiogram in [place]. Echocardiography is a painless test that uses sound waves to create images of your heart. It provides your doctor with information about the size and shape of your heart and how well your heart's chambers and valves are working. This procedure takes approximately one hour. There are no restrictions for this procedure. Please do NOT wear cologne, perfume, aftershave, or lotions (deodorant is allowed). Please arrive 15 minutes prior to your appointment time.    Follow-Up: At [facility], you and your health needs are our priority.  As part of our continuing mission to provide you with exceptional heart care, we have created designated Provider Care Teams.  These Care Teams include your primary Cardiologist (physician) and Advanced Practice Providers (APPs -  Physician Assistants and Nurse Practitioners) who all work together to provide you with the care you need, when you need it.  Your next appointment:   6 month(s)  Provider:   Berniece Salines, DO   Other instructions: Please contact your pulmonary doctor for a possible EPAP adjustment.

## 2022-11-25 NOTE — Progress Notes (Signed)
Cardiology Office Note:    Date:  11/26/2022   ID:  Cindy Hammond, DOB 08-02-1966, MRN [id number]  PCP:  Marge Duncans, PA-C  Cardiologist:  Berniece Salines, DO  Electrophysiologist:  None   Referring MD: Marge Duncans, PA-C   'I am short of nreath"   History of Present Illness:    Cindy Hammond is a 57 y.o. female with a hx of nonobstructive minimal coronary artery disease seen on coronary CTA done in January 2022, hypertension, hyperlipidemia, chronic diastolic heart failure is here today for follow-up visit.   At her visit in July 2023 I suspect that the she may be experiencing some musculoskeletal pain.  Today she tells me she is experiencing worsening shortness of breath on exertion.  She is concerned about this.  She can barely do anything and get short of breath.  Past Medical History:  Diagnosis Date   Anxiety    Depression    GERD (gastroesophageal reflux disease)    Hyperlipidemia    Hypertension    Pain in joint, lower leg    Thrombocytopenia (HCC)     Past Surgical History:  Procedure Laterality Date   TUBAL LIGATION      Current Medications: Current Meds  Medication Sig   albuterol (VENTOLIN HFA) 108 (90 Base) MCG/ACT inhaler INHALE 1-2 PUFFS INTO THE LUNGS EVERY 6 HOURS AS NEEDED FOR WHEEZING OR SHORTNESS OF BREATH   amitriptyline (ELAVIL) 150 MG tablet TAKE 1 TABLET BY MOUTH DAILY AT BEDTIME.   atenolol (TENORMIN) 25 MG tablet Take 1 tablet (25 mg total) by mouth 2 (two) times daily.   atorvastatin (LIPITOR) 80 MG tablet Take 1 tablet (80 mg total) by mouth daily.   benzonatate (TESSALON) 100 MG capsule Take 1 capsule (100 mg total) by mouth 2 (two) times daily as needed for cough.   busPIRone (BUSPAR) 30 MG tablet Take 1 tablet (30 mg total) by mouth 2 (two) times daily.   clonazePAM (KLONOPIN) 0.5 MG tablet Take 1 tablet by mouth twice a day.   dicyclomine (BENTYL) 20 MG tablet TAKE 1 TABLET BY MOUTH THREE(3) TIMES DAILY BEFORE MEALS   Erenumab-aooe (AIMOVIG, 140  MG DOSE, [place]) Inject into the skin.   KRILL OIL PO Take 1 capsule by mouth 2 (two) times daily.   nitroGLYCERIN (NITROSTAT) 0.4 MG SL tablet PLACE 1 TABLET UNDER THE TONGUE AS NEEDED FOR CHEST PAIN ( MAY REPEAT EVERY5 MINUTES X 3)   omeprazole (PRILOSEC) 40 MG capsule TAKE 1 CAPSULE BY MOUTH ONCE DAILY.   pregabalin (LYRICA) 75 MG capsule Take 1 capsule by mouth 3 times a day.   QUEtiapine (SEROQUEL) 300 MG tablet TAKE 1 TABLET BY MOUTH AT BEDTIME   ranolazine (RANEXA) 500 MG 12 hr tablet TAKE 1 TABLET BY MOUTH TWICE(2) DAILY   topiramate (TOPAMAX) 100 MG tablet Take 1 tablet by mouth 3 times daily.   Ubrogepant (UBRELVY) 100 MG TABS Take 100 mg by mouth 2 (two) times daily as needed (migraine).   Vitamin D, Ergocalciferol, 50000 units CAPS TAKE 1 CAPSULE BY MOUTH TWICE A WEEK     Allergies:   Sumatriptan and Sulfa antibiotics   Social History   Socioeconomic History   Marital status: Married    Spouse name: Not on file   Number of children: 3   Years of education: Not on file   Highest education level: Not on file  Occupational History   Occupation: Unemployed  Tobacco Use   Smoking status: Former    Packs/day:  0.50    Years: 10.00    Additional pack years: 0.00    Total pack years: 5.00    Types: Cigarettes    Quit date: 2000    Years since quitting: 24.2   Smokeless tobacco: Never  Vaping Use   Vaping Use: Never used  Substance and Sexual Activity   Alcohol use: No    Alcohol/week: 0.0 standard drinks of alcohol   Drug use: No   Sexual activity: Yes    Partners: Male  Other Topics Concern   Not on file  Social History Narrative   Not on file   Social Determinants of Health   Financial Resource Strain: Not on file  Food Insecurity: Not on file  Transportation Needs: Not on file  Physical Activity: Not on file  Stress: Not on file  Social Connections: Not on file     Family History: The patient's family history includes Hyperlipidemia in her father and  mother; Hypertension in her father and mother.  ROS:   Review of Systems  Constitution: Negative for decreased appetite, fever and weight gain.  HENT: Negative for congestion, ear discharge, hoarse voice and sore throat.   Eyes: Negative for discharge, redness, vision loss in right eye and visual halos.  Cardiovascular: Negative for chest pain, dyspnea on exertion, leg swelling, orthopnea and palpitations.  Respiratory: Negative for cough, hemoptysis, shortness of breath and snoring.   Endocrine: Negative for heat intolerance and polyphagia.  Hematologic/Lymphatic: Negative for bleeding problem. Does not bruise/bleed easily.  Skin: Negative for flushing, nail changes, rash and suspicious lesions.  Musculoskeletal: Negative for arthritis, joint pain, muscle cramps, myalgias, neck pain and stiffness.  Gastrointestinal: Negative for abdominal pain, bowel incontinence, diarrhea and excessive appetite.  Genitourinary: Negative for decreased libido, genital sores and incomplete emptying.  Neurological: Negative for brief paralysis, focal weakness, headaches and loss of balance.  Psychiatric/Behavioral: Negative for altered mental status, depression and suicidal ideas.  Allergic/Immunologic: Negative for HIV exposure and persistent infections.    EKGs/Labs/Other Studies Reviewed:    The following studies were reviewed today:   EKG:  The ekg ordered today demonstrates sinus rhythm, heart rate 70 bpm  Recent Labs: 03/26/2022: Magnesium 2.1 11/19/2022: ALT 27; BUN 14; Creatinine, Ser 0.98; Hemoglobin 15.6; Platelets 289; Potassium 4.0; Sodium 143; TSH 3.100  Recent Lipid Panel    Component Value Date/Time   CHOL 205 (H) 11/19/2022 0833   TRIG 263 (H) 11/19/2022 0833   HDL 40 11/19/2022 0833   CHOLHDL 5.1 (H) 11/19/2022 0833   LDLCALC 119 (H) 11/19/2022 [id number]    Physical Exam:    VS:  BP 132/78   Pulse 79   Ht 5' (1.524 m)   Wt 245 lb (111.1 kg)   SpO2 95%   BMI 47.85 kg/m     Wt  Readings from Last 3 Encounters:  11/25/22 245 lb (111.1 kg)  11/11/22 248 lb 3.2 oz (112.6 kg)  08/06/22 242 lb 9.6 oz (110 kg)     GEN: Well nourished, well developed in no acute distress HEENT: Normal NECK: No JVD; No carotid bruits LYMPHATICS: No lymphadenopathy CARDIAC: S1S2 noted,RRR, no murmurs, rubs, gallops RESPIRATORY:  Clear to auscultation without rales, wheezing or rhonchi  ABDOMEN: Soft, non-tender, non-distended, +bowel sounds, no guarding. EXTREMITIES: No edema, No cyanosis, no clubbing MUSCULOSKELETAL:  No deformity  SKIN: Warm and dry NEUROLOGIC:  Alert and oriented x 3, non-focal PSYCHIATRIC:  Normal affect, good insight  ASSESSMENT:    1. SOB (shortness of breath)  2. Benign hypertension   3. Mixed hyperlipidemia   4. Prediabetes   5. Morbid obesity ([place])     PLAN:    Shortness of breath I believe is multifactorial: Morbid obesity with deconditioning and OSA.  What I will do is repeat an echocardiogram to rule any structural abnormalities and left ventricular dysfunction.  I have also asked the patient to follow-up with her pulmonary physician to possibly readjust her EPAP setting as this has not been adjusted in several years that she has had a significant weight change.   The patient understands the need to lose weight with diet and exercise. We have discussed specific strategies for this.  The patient is in agreement with the above plan. The patient left the office in stable condition.  The patient will follow up in   Medication Adjustments/Labs and Tests Ordered: Current medicines are reviewed at length with the patient today.  Concerns regarding medicines are outlined above.  Orders Placed This Encounter  Procedures   EKG 12-Lead   ECHOCARDIOGRAM COMPLETE   No orders of the defined types were placed in this encounter.   Patient Instructions  Medication Instructions:  Your physician recommends that you continue on your current medications as  directed. Please refer to the Current Medication list given to you today.  *If you need a refill on your cardiac medications before your next appointment, please call your pharmacy*   Lab Work: None   Testing/Procedures: Your physician has requested that you have an echocardiogram in [place]. Echocardiography is a painless test that uses sound waves to create images of your heart. It provides your doctor with information about the size and shape of your heart and how well your heart's chambers and valves are working. This procedure takes approximately one hour. There are no restrictions for this procedure. Please do NOT wear cologne, perfume, aftershave, or lotions (deodorant is allowed). Please arrive 15 minutes prior to your appointment time.    Follow-Up: At [facility], you and your health needs are our priority.  As part of our continuing mission to provide you with exceptional heart care, we have created designated Provider Care Teams.  These Care Teams include your primary Cardiologist (physician) and Advanced Practice Providers (APPs -  Physician Assistants and Nurse Practitioners) who all work together to provide you with the care you need, when you need it.  Your next appointment:   6 month(s)  Provider:   Berniece Salines, DO   Other instructions: Please contact your pulmonary doctor for a possible EPAP adjustment.    Adopting a Healthy Lifestyle.  Know what a healthy weight is for you (roughly BMI <25) and aim to maintain this   Aim for 7+ servings of fruits and vegetables daily   65-80+ fluid ounces of water or unsweet tea for healthy kidneys   Limit to max 1 drink of alcohol per day; avoid smoking/tobacco   Limit animal fats in diet for cholesterol and heart health - choose grass fed whenever available   Avoid highly processed foods, and foods high in saturated/trans fats   Aim for low stress - take time to unwind and care for your mental health   Aim  for 150 min of moderate intensity exercise weekly for heart health, and weights twice weekly for bone health   Aim for 7-9 hours of sleep daily   When it comes to diets, agreement about the perfect plan isnt easy to find, even among the experts. Experts at the [employer] developed an idea known as the Healthy Eating Plate. Just imagine a plate divided into logical, healthy portions.   The emphasis is on diet quality:   Load up on vegetables and fruits - one-half of your plate: Aim for color and variety, and remember that potatoes dont count.   Go for whole grains - one-quarter of your plate: Whole wheat, barley, wheat berries, quinoa, oats, brown rice, and foods made with them. If you want pasta, go with whole wheat pasta.   Protein power - one-quarter of your plate: Fish, chicken, beans, and nuts are all healthy, versatile protein sources. Limit red meat.   The diet, however, does go beyond the plate, offering a few other suggestions.   Use healthy plant oils, such as olive, canola, soy, corn, sunflower and peanut. Check the labels, and avoid partially hydrogenated oil, which have unhealthy trans fats.   If youre thirsty, drink water. Coffee and tea are good in moderation, but skip sugary drinks and limit milk and dairy products to one or two daily servings.   The type of carbohydrate in the diet is more important than the amount. Some sources of carbohydrates, such as vegetables, fruits, whole grains, and beans-are healthier than others.   Finally, stay active  Signed, Berniece Salines, DO  11/26/2022 9:02 PM     [facility]

## 2022-11-26 DIAGNOSIS — B9689 Other specified bacterial agents as the cause of diseases classified elsewhere: Secondary | ICD-10-CM | POA: Diagnosis not present

## 2022-11-26 DIAGNOSIS — N952 Postmenopausal atrophic vaginitis: Secondary | ICD-10-CM | POA: Diagnosis not present

## 2022-11-26 DIAGNOSIS — R300 Dysuria: Secondary | ICD-10-CM | POA: Diagnosis not present

## 2022-11-26 DIAGNOSIS — N760 Acute vaginitis: Secondary | ICD-10-CM | POA: Diagnosis not present

## 2022-11-27 DIAGNOSIS — G43709 Chronic migraine without aura, not intractable, without status migrainosus: Secondary | ICD-10-CM | POA: Diagnosis not present

## 2022-12-04 ENCOUNTER — Encounter: Payer: Self-pay | Admitting: Physician Assistant

## 2022-12-05 ENCOUNTER — Ambulatory Visit: Payer: Medicaid Other | Attending: Cardiology

## 2022-12-05 ENCOUNTER — Other Ambulatory Visit: Payer: Self-pay

## 2022-12-05 DIAGNOSIS — R0602 Shortness of breath: Secondary | ICD-10-CM

## 2022-12-05 DIAGNOSIS — F419 Anxiety disorder, unspecified: Secondary | ICD-10-CM

## 2022-12-05 LAB — ECHOCARDIOGRAM COMPLETE
Area-P 1/2: 4.03 cm2
S' Lateral: 2.4 cm

## 2022-12-05 MED ORDER — QUETIAPINE FUMARATE 300 MG PO TABS: 300.0000 mg | ORAL_TABLET | Freq: Every day | ORAL | 1 refills | Status: DC

## 2022-12-05 MED ORDER — CLONAZEPAM 0.5 MG PO TABS: 0.5000 mg | ORAL_TABLET | Freq: Two times a day (BID) | ORAL | 2 refills | Status: DC

## 2022-12-22 ENCOUNTER — Other Ambulatory Visit: Payer: Self-pay | Admitting: Physician Assistant

## 2022-12-22 DIAGNOSIS — E559 Vitamin D deficiency, unspecified: Secondary | ICD-10-CM

## 2023-02-04 DIAGNOSIS — F411 Generalized anxiety disorder: Secondary | ICD-10-CM | POA: Diagnosis not present

## 2023-02-04 DIAGNOSIS — R5383 Other fatigue: Secondary | ICD-10-CM | POA: Diagnosis not present

## 2023-02-04 DIAGNOSIS — G4733 Obstructive sleep apnea (adult) (pediatric): Secondary | ICD-10-CM | POA: Diagnosis not present

## 2023-02-04 DIAGNOSIS — J4540 Moderate persistent asthma, uncomplicated: Secondary | ICD-10-CM | POA: Diagnosis not present

## 2023-02-18 DIAGNOSIS — G4733 Obstructive sleep apnea (adult) (pediatric): Secondary | ICD-10-CM | POA: Diagnosis not present

## 2023-02-23 DIAGNOSIS — F411 Generalized anxiety disorder: Secondary | ICD-10-CM | POA: Diagnosis not present

## 2023-02-23 DIAGNOSIS — J4540 Moderate persistent asthma, uncomplicated: Secondary | ICD-10-CM | POA: Diagnosis not present

## 2023-02-23 DIAGNOSIS — G4736 Sleep related hypoventilation in conditions classified elsewhere: Secondary | ICD-10-CM | POA: Diagnosis not present

## 2023-02-23 DIAGNOSIS — R5383 Other fatigue: Secondary | ICD-10-CM | POA: Diagnosis not present

## 2023-02-26 ENCOUNTER — Encounter: Payer: Self-pay | Admitting: Cardiology

## 2023-02-27 DIAGNOSIS — R051 Acute cough: Secondary | ICD-10-CM | POA: Diagnosis not present

## 2023-02-27 DIAGNOSIS — R0981 Nasal congestion: Secondary | ICD-10-CM | POA: Diagnosis not present

## 2023-02-27 DIAGNOSIS — B349 Viral infection, unspecified: Secondary | ICD-10-CM | POA: Diagnosis not present

## 2023-02-27 DIAGNOSIS — R519 Headache, unspecified: Secondary | ICD-10-CM | POA: Diagnosis not present

## 2023-02-27 DIAGNOSIS — R5381 Other malaise: Secondary | ICD-10-CM | POA: Diagnosis not present

## 2023-03-02 ENCOUNTER — Other Ambulatory Visit: Payer: Self-pay | Admitting: Physician Assistant

## 2023-03-09 ENCOUNTER — Other Ambulatory Visit: Payer: Self-pay | Admitting: Family Medicine

## 2023-03-09 DIAGNOSIS — F419 Anxiety disorder, unspecified: Secondary | ICD-10-CM

## 2023-03-16 ENCOUNTER — Other Ambulatory Visit: Payer: Self-pay | Admitting: Cardiology

## 2023-03-17 DIAGNOSIS — G4733 Obstructive sleep apnea (adult) (pediatric): Secondary | ICD-10-CM | POA: Diagnosis not present

## 2023-03-23 ENCOUNTER — Ambulatory Visit: Payer: Medicaid Other | Admitting: Physician Assistant

## 2023-03-23 DIAGNOSIS — J4540 Moderate persistent asthma, uncomplicated: Secondary | ICD-10-CM | POA: Diagnosis not present

## 2023-03-23 DIAGNOSIS — G4736 Sleep related hypoventilation in conditions classified elsewhere: Secondary | ICD-10-CM | POA: Diagnosis not present

## 2023-03-23 DIAGNOSIS — R5383 Other fatigue: Secondary | ICD-10-CM | POA: Diagnosis not present

## 2023-03-23 DIAGNOSIS — F411 Generalized anxiety disorder: Secondary | ICD-10-CM | POA: Diagnosis not present

## 2023-03-31 ENCOUNTER — Other Ambulatory Visit: Payer: Self-pay | Admitting: Family Medicine

## 2023-03-31 DIAGNOSIS — F419 Anxiety disorder, unspecified: Secondary | ICD-10-CM

## 2023-04-01 ENCOUNTER — Other Ambulatory Visit: Payer: Self-pay | Admitting: Family Medicine

## 2023-04-01 DIAGNOSIS — E559 Vitamin D deficiency, unspecified: Secondary | ICD-10-CM

## 2023-04-02 ENCOUNTER — Other Ambulatory Visit: Payer: Self-pay

## 2023-04-02 DIAGNOSIS — F419 Anxiety disorder, unspecified: Secondary | ICD-10-CM

## 2023-04-07 ENCOUNTER — Encounter: Payer: Self-pay | Admitting: Physician Assistant

## 2023-04-07 ENCOUNTER — Ambulatory Visit: Payer: Medicaid Other | Admitting: Physician Assistant

## 2023-04-07 VITALS — BP 118/82 | HR 90 | Temp 97.7°F | Ht 60.0 in | Wt 243.6 lb

## 2023-04-07 DIAGNOSIS — R7303 Prediabetes: Secondary | ICD-10-CM | POA: Diagnosis not present

## 2023-04-07 DIAGNOSIS — M797 Fibromyalgia: Secondary | ICD-10-CM | POA: Diagnosis not present

## 2023-04-07 DIAGNOSIS — I10 Essential (primary) hypertension: Secondary | ICD-10-CM | POA: Diagnosis not present

## 2023-04-07 DIAGNOSIS — F419 Anxiety disorder, unspecified: Secondary | ICD-10-CM | POA: Diagnosis not present

## 2023-04-07 DIAGNOSIS — K219 Gastro-esophageal reflux disease without esophagitis: Secondary | ICD-10-CM

## 2023-04-07 DIAGNOSIS — R3589 Other polyuria: Secondary | ICD-10-CM

## 2023-04-07 DIAGNOSIS — E782 Mixed hyperlipidemia: Secondary | ICD-10-CM

## 2023-04-07 DIAGNOSIS — E559 Vitamin D deficiency, unspecified: Secondary | ICD-10-CM

## 2023-04-07 DIAGNOSIS — N3000 Acute cystitis without hematuria: Secondary | ICD-10-CM | POA: Diagnosis not present

## 2023-04-07 HISTORY — DX: Other polyuria: R35.89

## 2023-04-07 LAB — POCT URINALYSIS DIP (CLINITEK)
Bilirubin, UA: NEGATIVE
Blood, UA: NEGATIVE
Glucose, UA: NEGATIVE mg/dL
Ketones, POC UA: NEGATIVE mg/dL
Nitrite, UA: NEGATIVE
POC PROTEIN,UA: NEGATIVE
Spec Grav, UA: 1.015 (ref 1.010–1.025)
Urobilinogen, UA: 0.2 E.U./dL
pH, UA: 6 (ref 5.0–8.0)

## 2023-04-07 MED ORDER — NITROFURANTOIN MONOHYD MACRO 100 MG PO CAPS: 100.0000 mg | ORAL_CAPSULE | Freq: Two times a day (BID) | ORAL | 0 refills | Status: DC

## 2023-04-07 NOTE — Progress Notes (Signed)
Established Patient Office Visit  Subjective:  Patient ID: Cindy Hammond, female    DOB: 1966/03/15  Age: 57 y.o. MRN: [id number]  CC:  Chief Complaint  Patient presents with   Medical Management of Chronic Issues    HPI Cindy Hammond presents for follow up hyperlipidemia and chronic issues  Mixed hyperlipidemia  Pt presents with hyperlipidemia.  Compliance with treatment has been good; The patient is compliant with medications, maintains a low cholesterol diet , follows up as directed , . The patient denies experiencing any hypercholesterolemia related symptoms.  She is currently taking lipitor 80mg  qd  Pt presents for follow up of hypertension.  The patient is tolerating the medication well without side effects. Compliance with treatment has been good; including taking medication as directed ,    She is currently on atenolol 25 mg qd and lasix 40mg  qd- she denies chest pain or shortness of breath Pt follows with Dr Servando Salina cardiologist regularly who has her taking Ranexa and use nitrrostat as needed.  She has follow up appt next month  Pt with  GERD - stable on omeprazole 40mg  qd  Pt with  anxiety - states her symptoms controlled well with clonazepam, amitriptyline,seroquel and buspar - voices no problems or concerns - she states she is seeing  a therapist regularly- Cindy Hammond  Pt currently taking Vit D weekly - due for labwork  Pt with  migraines - currently stable on topamax and ubrelvy- she follows regularly with Neurology - Cindy Plowman NP  Patient has  prediabetes - states at this time is not really watching diet well but trying- due to repeat hgb a1c  Pt has had some mild polyuria recently - would like to check ua  Past Medical History:  Diagnosis Date   Anxiety    Depression    GERD (gastroesophageal reflux disease)    Hyperlipidemia    Hypertension    Pain in joint, lower leg    Thrombocytopenia (HCC)     Past Surgical History:  Procedure Laterality Date   TUBAL  LIGATION      Family History  Problem Relation Age of Onset   Hyperlipidemia Mother    Hypertension Mother    Hyperlipidemia Father    Hypertension Father     Social History   Socioeconomic History   Marital status: Married    Spouse name: Not on file   Number of children: 3   Years of education: Not on file   Highest education level: Not on file  Occupational History   Occupation: Unemployed  Tobacco Use   Smoking status: Former    Current packs/day: 0.00    Average packs/day: 0.5 packs/day for 10.0 years (5.0 ttl pk-yrs)    Types: Cigarettes    Start date: 41    Quit date: 2000    Years since quitting: 24.6   Smokeless tobacco: Never  Vaping Use   Vaping status: Never Used  Substance and Sexual Activity   Alcohol use: No    Alcohol/week: 0.0 standard drinks of alcohol   Drug use: No   Sexual activity: Yes    Partners: Male  Other Topics Concern   Not on file  Social History Narrative   Not on file   Social Determinants of Health   Financial Resource Strain: Low Risk  (04/07/2023)   Overall Financial Resource Strain (CARDIA)    Difficulty of Paying Living Expenses: Not hard at all  Food Insecurity: No Food Insecurity (04/07/2023)   Hunger Vital  Sign    Worried About Programme researcher, broadcasting/film/video in the Last Year: Never true    Ran Out of Food in the Last Year: Never true  Transportation Needs: No Transportation Needs (04/07/2023)   PRAPARE - Administrator, Civil Service (Medical): No    Lack of Transportation (Non-Medical): No  Physical Activity: Inactive (04/07/2023)   Exercise Vital Sign    Days of Exercise per Week: 0 days    Minutes of Exercise per Session: 0 min  Stress: No Stress Concern Present (04/07/2023)   [employer] of Occupational Health - Occupational Stress Questionnaire    Feeling of Stress : Not at all  Social Connections: Moderately Isolated (04/07/2023)   Social Connection and Isolation Panel [NHANES]    Frequency of Communication  with Friends and Family: More than three times a week    Frequency of Social Gatherings with Friends and Family: More than three times a week    Attends Religious Services: Never    Database administrator or Organizations: No    Attends Banker Meetings: Never    Marital Status: Married  Catering manager Violence: Not At Risk (04/07/2023)   Humiliation, Afraid, Rape, and Kick questionnaire    Fear of Current or Ex-Partner: No    Emotionally Abused: No    Physically Abused: No    Sexually Abused: No     Current Outpatient Medications:    albuterol (VENTOLIN HFA) 108 (90 Base) MCG/ACT inhaler, INHALE 1-2 PUFFS INTO THE LUNGS EVERY 6 HOURS AS NEEDED FOR WHEEZING OR SHORTNESS OF BREATH, Disp: 8.5 g, Rfl: 2   amitriptyline (ELAVIL) 150 MG tablet, TAKE 1 TABLET BY MOUTH DAILY AT BEDTIME., Disp: 90 tablet, Rfl: 1   atenolol (TENORMIN) 25 MG tablet, Take 1 tablet (25 mg total) by mouth 2 (two) times daily., Disp: 180 tablet, Rfl: 3   atorvastatin (LIPITOR) 80 MG tablet, Take 1 tablet (80 mg total) by mouth daily., Disp: 90 tablet, Rfl: 1   busPIRone (BUSPAR) 30 MG tablet, Take 1 tablet (30 mg total) by mouth 2 (two) times daily., Disp: 180 tablet, Rfl: 1   clonazePAM (KLONOPIN) 0.5 MG tablet, TAKE ONE TABLET BY MOUTH TWICE DAILY, Disp: 60 tablet, Rfl: 1   dicyclomine (BENTYL) 20 MG tablet, TAKE 1 TABLET BY MOUTH THREE(3) TIMES DAILY BEFORE MEALS, Disp: 90 tablet, Rfl: 1   Erenumab-aooe (AIMOVIG, 140 MG DOSE, [place]), Inject into the skin., Disp: , Rfl:    furosemide (LASIX) 40 MG tablet, TAKE 1 TABLET BY MOUTH ONCE DAILY, Disp: 90 tablet, Rfl: 1   KRILL OIL PO, Take 1 capsule by mouth 2 (two) times daily., Disp: , Rfl:    nitroGLYCERIN (NITROSTAT) 0.4 MG SL tablet, PLACE 1 TABLET UNDER THE TONGUE AS NEEDED FOR CHEST PAIN ( MAY REPEAT EVERY5 MINUTES X 3), Disp: 25 tablet, Rfl: 3   omeprazole (PRILOSEC) 40 MG capsule, TAKE 1 CAPSULE BY MOUTH ONCE DAILY., Disp: 90 capsule, Rfl: 1   pregabalin  (LYRICA) 75 MG capsule, Take 1 capsule by mouth 3 times daily., Disp: 270 capsule, Rfl: 0   QUEtiapine (SEROQUEL) 300 MG tablet, Take 1 tablet (300 mg total) by mouth at bedtime., Disp: 90 tablet, Rfl: 1   ranolazine (RANEXA) 500 MG 12 hr tablet, TAKE 1 TABLET BY MOUTH TWICE(2) DAILY, Disp: 60 tablet, Rfl: 11   topiramate (TOPAMAX) 100 MG tablet, Take 1 tablet by mouth 3 times daily., Disp: 270 tablet, Rfl: 1   Ubrogepant (UBRELVY) 100  MG TABS, Take 100 mg by mouth 2 (two) times daily as needed (migraine)., Disp: , Rfl:    Vitamin D, Ergocalciferol, 50000 units CAPS, Take 1 capsule by mouth twice weekly as directed., Disp: 10 capsule, Rfl: 2   Allergies  Allergen Reactions   Sumatriptan Hives and Rash   Sulfa Antibiotics    CONSTITUTIONAL: Negative for chills, fatigue, fever, unintentional weight gain and unintentional weight loss.  E/N/T: Negative for ear pain, nasal congestion and sore throat.  CARDIOVASCULAR: Negative for chest pain, dizziness, palpitations and pedal edema.  RESPIRATORY: Negative for recent cough and dyspnea.  GASTROINTESTINAL: Negative for abdominal pain, acid reflux symptoms, constipation, diarrhea, nausea and vomiting.  GU - see HPI MSK: Negative for arthralgias and myalgias.  INTEGUMENTARY: Negative for rash.  NEUROLOGICAL: Negative for dizziness and headaches.  PSYCHIATRIC: Negative for sleep disturbance and to question depression screen.  Negative for depression, negative for anhedonia.        Objective:  PHYSICAL EXAM:   VS: BP 118/82 (BP Location: Left Arm, Patient Position: Sitting, Cuff Size: Large)   Pulse 90   Temp 97.7 F (36.5 C) (Temporal)   Ht 5' (1.524 m)   Wt 243 lb 9.6 oz (110.5 kg)   SpO2 93%   BMI 47.57 kg/m   GEN: Well nourished, well developed, in no acute distress  Cardiac: RRR; no murmurs, rubs, or gallops,no edema -  Respiratory:  normal respiratory rate and pattern with no distress - normal breath sounds with no rales, rhonchi,  wheezes or rubs GI: normal bowel sounds, no masses or tenderness MS: no deformity or atrophy  Skin: warm and dry, no rash  Neuro:  Alert and Oriented x 3,  - CN II-Xii grossly intact Psych: euthymic mood, appropriate affect and demeanor  Office Visit on 04/07/2023  Component Date Value Ref Range Status   Color, UA 04/07/2023 yellow  yellow Final   Clarity, UA 04/07/2023 clear  clear Final   Glucose, UA 04/07/2023 negative  negative mg/dL Final   Bilirubin, UA 16/06/9603 negative  negative Final   Ketones, POC UA 04/07/2023 negative  negative mg/dL Final   Spec Grav, UA 54/05/8118 1.015  1.010 - 1.025 Final   Blood, UA 04/07/2023 negative  negative Final   pH, UA 04/07/2023 6.0  5.0 - 8.0 Final   POC PROTEIN,UA 04/07/2023 negative  negative, trace Final   Urobilinogen, UA 04/07/2023 0.2  0.2 or 1.0 E.U./dL Final   Nitrite, UA 14/78/2956 Negative  Negative Final   Leukocytes, UA 04/07/2023 Large (3+) (A)  Negative Final    Health Maintenance Due  Topic Date Due   INFLUENZA VACCINE  04/02/2023    There are no preventive care reminders to display for this patient.  Lab Results  Component Value Date   TSH 3.100 11/19/2022   Lab Results  Component Value Date   WBC 7.6 11/19/2022   HGB 15.6 11/19/2022   HCT 44.8 11/19/2022   MCV 96 11/19/2022   PLT 289 11/19/2022   Lab Results  Component Value Date   NA 143 11/19/2022   K 4.0 11/19/2022   CO2 21 11/19/2022   GLUCOSE 119 (H) 11/19/2022   BUN 14 11/19/2022   CREATININE 0.98 11/19/2022   BILITOT 0.4 11/19/2022   ALKPHOS 214 (H) 11/19/2022   AST 20 11/19/2022   ALT 27 11/19/2022   PROT 7.2 11/19/2022   ALBUMIN 4.2 11/19/2022   CALCIUM 9.0 11/19/2022   ANIONGAP 9 08/25/2021   EGFR 68 11/19/2022  Lab Results  Component Value Date   CHOL 205 (H) 11/19/2022   Lab Results  Component Value Date   HDL 40 11/19/2022   Lab Results  Component Value Date   LDLCALC 119 (H) 11/19/2022   Lab Results  Component Value  Date   TRIG 263 (H) 11/19/2022   Lab Results  Component Value Date   CHOLHDL 5.1 (H) 11/19/2022   Lab Results  Component Value Date   HGBA1C 6.0 (H) 11/19/2022      Assessment & Plan:   Problem List Items Addressed This Visit       Cardiovascular and Mediastinum   Benign hypertension - Primary   Relevant Orders   CBC with Differential/Platelet   Comprehensive metabolic panel   TSH Continue meds Follow up with cardiology as directed     Digestive   Gastroesophageal reflux disease without esophagitis Continue meds as directed     Other   Anxiety Continue meds as directed Follow up with therapist    Vitamin D deficiency   Relevant Orders   VITAMIN D 25 Hydroxy (Vit-D Deficiency, Fractures)   Mixed hyperlipidemia   Relevant Orders   Lipid panel Continue meds Watch diet  Prediabetes Watch diet Hgb a1c pending  CAD Continue current meds and follow up with cardiology as directed  Migraines Continue meds and follow up with neurology as directed  UTI Urine culture pending Rx for macrobid 100mg  bid         Follow-up: Return in about 4 months (around 08/07/2023) for chronic fasting follow-up.    SARA R , PA-C

## 2023-04-18 DIAGNOSIS — J019 Acute sinusitis, unspecified: Secondary | ICD-10-CM | POA: Diagnosis not present

## 2023-04-18 DIAGNOSIS — J069 Acute upper respiratory infection, unspecified: Secondary | ICD-10-CM | POA: Diagnosis not present

## 2023-04-23 ENCOUNTER — Encounter: Payer: Self-pay | Admitting: Physician Assistant

## 2023-04-28 DIAGNOSIS — G43009 Migraine without aura, not intractable, without status migrainosus: Secondary | ICD-10-CM | POA: Diagnosis not present

## 2023-04-29 ENCOUNTER — Other Ambulatory Visit: Payer: Self-pay | Admitting: Family Medicine

## 2023-05-05 ENCOUNTER — Other Ambulatory Visit: Payer: Self-pay | Admitting: Physician Assistant

## 2023-05-05 DIAGNOSIS — F419 Anxiety disorder, unspecified: Secondary | ICD-10-CM

## 2023-05-14 ENCOUNTER — Encounter: Payer: Self-pay | Admitting: Physician Assistant

## 2023-05-14 ENCOUNTER — Ambulatory Visit: Payer: Medicaid Other | Admitting: Physician Assistant

## 2023-05-14 VITALS — BP 102/82 | HR 93 | Temp 97.2°F | Resp 16 | Ht 60.0 in | Wt 244.0 lb

## 2023-05-14 DIAGNOSIS — K219 Gastro-esophageal reflux disease without esophagitis: Secondary | ICD-10-CM | POA: Diagnosis not present

## 2023-05-14 DIAGNOSIS — L609 Nail disorder, unspecified: Secondary | ICD-10-CM | POA: Diagnosis not present

## 2023-05-14 DIAGNOSIS — Z23 Encounter for immunization: Secondary | ICD-10-CM

## 2023-05-14 HISTORY — DX: Encounter for immunization: Z23

## 2023-05-14 HISTORY — DX: Nail disorder, unspecified: L60.9

## 2023-05-14 MED ORDER — OMEPRAZOLE 40 MG PO CPDR
40.0000 mg | DELAYED_RELEASE_CAPSULE | Freq: Every day | ORAL | 1 refills | Status: DC
Start: 2023-05-14 — End: 2023-09-29

## 2023-05-14 NOTE — Progress Notes (Signed)
Acute Office Visit  Subjective:    Patient ID: Cindy Hammond, female    DOB: 1966-04-20, 57 y.o.   MRN: [id number]  Chief Complaint  Patient presents with   Nail Problem    HPI: Patient is in today for complaints of ridges and discoloration of tips of nails - has noted several months ago Does not have ingrown nails No history of anemia  Would like flu shot today Requests refill of omeprazole for GERD   Current Outpatient Medications:    albuterol (VENTOLIN HFA) 108 (90 Base) MCG/ACT inhaler, INHALE 1-2 PUFFS INTO THE LUNGS EVERY 6 HOURS AS NEEDED FOR WHEEZING OR SHORTNESS OF BREATH, Disp: 8.5 g, Rfl: 2   amitriptyline (ELAVIL) 150 MG tablet, TAKE 1 TABLET BY MOUTH DAILY AT BEDTIME., Disp: 90 tablet, Rfl: 1   atenolol (TENORMIN) 25 MG tablet, Take 1 tablet (25 mg total) by mouth 2 (two) times daily., Disp: 180 tablet, Rfl: 3   atorvastatin (LIPITOR) 80 MG tablet, Take 1 tablet (80 mg total) by mouth daily., Disp: 90 tablet, Rfl: 1   busPIRone (BUSPAR) 30 MG tablet, Take 1 tablet (30 mg total) by mouth 2 (two) times daily., Disp: 180 tablet, Rfl: 1   clonazePAM (KLONOPIN) 0.5 MG tablet, TAKE ONE TABLET BY MOUTH TWICE DAILY, Disp: 60 tablet, Rfl: 1   dicyclomine (BENTYL) 20 MG tablet, TAKE 1 TABLET BY MOUTH THREE(3) TIMES DAILY BEFORE MEALS, Disp: 90 tablet, Rfl: 1   Erenumab-aooe (AIMOVIG, 140 MG DOSE, [place]), Inject into the skin., Disp: , Rfl:    furosemide (LASIX) 40 MG tablet, TAKE 1 TABLET BY MOUTH ONCE DAILY, Disp: 90 tablet, Rfl: 1   KRILL OIL PO, Take 1 capsule by mouth 2 (two) times daily., Disp: , Rfl:    nitrofurantoin, macrocrystal-monohydrate, (MACROBID) 100 MG capsule, Take 1 capsule (100 mg total) by mouth 2 (two) times daily., Disp: 20 capsule, Rfl: 0   nitroGLYCERIN (NITROSTAT) 0.4 MG SL tablet, PLACE 1 TABLET UNDER THE TONGUE AS NEEDED FOR CHEST PAIN ( MAY REPEAT EVERY5 MINUTES X 3), Disp: 25 tablet, Rfl: 3   pregabalin (LYRICA) 75 MG capsule, Take 1 capsule by mouth 3  times daily., Disp: 270 capsule, Rfl: 0   QUEtiapine (SEROQUEL) 300 MG tablet, Take 1 tablet by mouth once daily. AT BEDTIME, Disp: 90 tablet, Rfl: 1   ranolazine (RANEXA) 500 MG 12 hr tablet, TAKE 1 TABLET BY MOUTH TWICE(2) DAILY, Disp: 60 tablet, Rfl: 11   topiramate (TOPAMAX) 100 MG tablet, Take 1 tablet by mouth 3 times daily., Disp: 270 tablet, Rfl: 1   Ubrogepant (UBRELVY) 100 MG TABS, Take 100 mg by mouth 2 (two) times daily as needed (migraine)., Disp: , Rfl:    Vitamin D, Ergocalciferol, 50000 units CAPS, Take 1 capsule by mouth twice weekly as directed., Disp: 10 capsule, Rfl: 2   omeprazole (PRILOSEC) 40 MG capsule, Take 1 capsule (40 mg total) by mouth daily., Disp: 90 capsule, Rfl: 1  Allergies  Allergen Reactions   Sumatriptan Hives and Rash   Sulfa Antibiotics     ROS CONSTITUTIONAL: Negative for chills, fatigue, fever,  CARDIOVASCULAR: Negative for chest pain,  RESPIRATORY: Negative for recent cough and dyspnea.  GASTROINTESTINAL: Negative for abdominal pain, acid reflux symptoms, constipation, diarrhea, nausea and vomiting.   INTEGUMENTARY:see HPI      Objective:    PHYSICAL EXAM:   BP 102/82 (BP Location: Left Arm, Patient Position: Sitting, Cuff Size: Large)   Pulse 93   Temp (!) 97.2 F (  36.2 C) (Temporal)   Resp 16   Ht 5' (1.524 m)   Wt 244 lb (110.7 kg)   SpO2 96%   BMI 47.65 kg/m    GEN: Well nourished, well developed, in no acute distress  Cardiac: RRR; no murmurs,  Respiratory:  normal respiratory rate and pattern with no distress - normal breath sounds with no rales, rhonchi, wheezes or rubs  Skin: nails with ridges noted      Assessment & Plan:    Nail disorder -     CBC with Differential/Platelet -     Iron, TIBC and Ferritin Panel -     B12 and Folate Panel  Gastroesophageal reflux disease without esophagitis -     Omeprazole; Take 1 capsule (40 mg total) by mouth daily.  Dispense: 90 capsule; Refill: 1  Needs flu shot -      Flu Vaccine Trivalent High Dose (Fluad)     Follow-up: Return for next scheduled chronic visit.  An After Visit Summary was printed and given to the patient.  Jettie Pagan [facility] [phone]

## 2023-05-15 LAB — CBC WITH DIFFERENTIAL/PLATELET
Basophils Absolute: 0.1 10*3/uL (ref 0.0–0.2)
Basos: 1 %
EOS (ABSOLUTE): 0.4 10*3/uL (ref 0.0–0.4)
Eos: 5 %
Hematocrit: 45.5 % (ref 34.0–46.6)
Hemoglobin: 15.4 g/dL (ref 11.1–15.9)
Immature Grans (Abs): 0 10*3/uL (ref 0.0–0.1)
Immature Granulocytes: 1 %
Lymphocytes Absolute: 1.9 10*3/uL (ref 0.7–3.1)
Lymphs: 23 %
MCH: 31.8 pg (ref 26.6–33.0)
MCHC: 33.8 g/dL (ref 31.5–35.7)
MCV: 94 fL (ref 79–97)
Monocytes Absolute: 0.7 10*3/uL (ref 0.1–0.9)
Monocytes: 8 %
Neutrophils Absolute: 5.1 10*3/uL (ref 1.4–7.0)
Neutrophils: 62 %
Platelets: 293 10*3/uL (ref 150–450)
RBC: 4.84 x10E6/uL (ref 3.77–5.28)
RDW: 12.5 % (ref 11.7–15.4)
WBC: 8.2 10*3/uL (ref 3.4–10.8)

## 2023-05-15 LAB — IRON,TIBC AND FERRITIN PANEL
Ferritin: 123 ng/mL (ref 15–150)
Iron Saturation: 30 % (ref 15–55)
Iron: 94 ug/dL (ref 27–159)
Total Iron Binding Capacity: 312 ug/dL (ref 250–450)
UIBC: 218 ug/dL (ref 131–425)

## 2023-05-15 LAB — B12 AND FOLATE PANEL
Folate: 3.6 ng/mL (ref >3.0)
Vitamin B-12: 302 pg/mL (ref 232–1245)

## 2023-05-21 DIAGNOSIS — Z1231 Encounter for screening mammogram for malignant neoplasm of breast: Secondary | ICD-10-CM | POA: Diagnosis not present

## 2023-05-21 LAB — HM MAMMOGRAPHY

## 2023-05-25 ENCOUNTER — Encounter: Payer: Self-pay | Admitting: Physician Assistant

## 2023-05-29 ENCOUNTER — Ambulatory Visit: Payer: Medicaid Other | Admitting: Cardiology

## 2023-06-09 ENCOUNTER — Other Ambulatory Visit: Payer: Self-pay | Admitting: Physician Assistant

## 2023-06-17 NOTE — Progress Notes (Deleted)
Cardiology Office Note:    Date:  06/17/2023   ID:  Cindy Hammond, DOB [birth year], MRN [id number]  PCP:  Cindy Hammond, Cindy Hammond   [facility] Providers Cardiologist:  Cindy Ripple, DO { Click to update primary MD,subspecialty MD or APP then REFRESH:1}    Referring MD: Cindy Sofia, PA-C   No chief complaint on file. ***  History of Present Illness:    Cindy Hammond is a 57 y.o. female with a hx of CAD nonobstructive by CCTA 09/2020, HTN, HLD, and chronic diastolic heart failure.   Last seen by Dr. Servando Hammond 03/26/22, started on lasix for LE swelling and low dose imdur for chest pain that was felt MSK.     CAD  Nonobstructive by CCTA 09/2020 - ASA, 15 mg imdur, atenolol   Hypertension - BB, imdur   Hyperlipidemia with LDL goal < 70 04/07/2023: Cholesterol, Total 209; HDL 44; LDL Chol Calc (NIH) 118; Triglycerides 271 - ?statin        Past Medical History:  Diagnosis Date   Anxiety    Depression    GERD (gastroesophageal reflux disease)    Hyperlipidemia    Hypertension    Pain in joint, lower leg    Thrombocytopenia (HCC)     Past Surgical History:  Procedure Laterality Date   TUBAL LIGATION      Current Medications: No outpatient medications have been marked as taking for the 06/22/23 encounter (Appointment) with Marcelino Duster, PA.     Allergies:   Sumatriptan and Sulfa antibiotics   Social History   Socioeconomic History   Marital status: Married    Spouse name: Not on file   Number of children: 3   Years of education: Not on file   Highest education level: Not on file  Occupational History   Occupation: Unemployed  Tobacco Use   Smoking status: Former    Current packs/day: 0.00    Average packs/day: 0.5 packs/day for 10.0 years (5.0 ttl pk-yrs)    Types: Cigarettes    Start date: 70    Quit date: 2000    Years since quitting: 24.8   Smokeless tobacco: Never  Vaping Use   Vaping status: Never Used  Substance and Sexual Activity    Alcohol use: No    Alcohol/week: 0.0 standard drinks of alcohol   Drug use: No   Sexual activity: Yes    Partners: Male  Other Topics Concern   Not on file  Social History Narrative   Not on file   Social Determinants of Health   Financial Resource Strain: Low Risk  (04/07/2023)   Overall Financial Resource Strain (CARDIA)    Difficulty of Paying Living Expenses: Not hard at all  Food Insecurity: No Food Insecurity (04/07/2023)   Hunger Vital Sign    Worried About Running Out of Food in the Last Year: Never true    Ran Out of Food in the Last Year: Never true  Transportation Needs: No Transportation Needs (04/07/2023)   PRAPARE - Administrator, Civil Service (Medical): No    Lack of Transportation (Non-Medical): No  Physical Activity: Inactive (04/07/2023)   Exercise Vital Sign    Days of Exercise per Week: 0 days    Minutes of Exercise per Session: 0 min  Stress: No Stress Concern Present (04/07/2023)   [employer] of Occupational Health - Occupational Stress Questionnaire    Feeling of Stress : Not at all  Social Connections: Moderately Isolated (04/07/2023)   Social  Connection and Isolation Panel [NHANES]    Frequency of Communication with Friends and Family: More than three times a week    Frequency of Social Gatherings with Friends and Family: More than three times a week    Attends Religious Services: Never    Database administrator or Organizations: No    Attends Engineer, structural: Never    Marital Status: Married     Family History: The patient's ***family history includes Hyperlipidemia in her father and mother; Hypertension in her father and mother.  ROS:   Please see the history of present illness.    *** All other systems reviewed and are negative.  EKGs/Labs/Other Studies Reviewed:    The following studies were reviewed today: ***      Recent Labs: 04/07/2023: ALT 26; BUN 9; Creatinine, Ser 0.94; Potassium 4.1; Sodium 142; TSH  1.670 05/14/2023: Hemoglobin 15.4; Platelets 293  Recent Lipid Panel    Component Value Date/Time   CHOL 209 (H) 04/07/2023 1350   TRIG 271 (H) 04/07/2023 1350   HDL 44 04/07/2023 1350   CHOLHDL 4.8 (H) 04/07/2023 1350   LDLCALC 118 (H) 04/07/2023 1350     Risk Assessment/Calculations:   {Does this patient have ATRIAL FIBRILLATION?[phone]  No BP recorded.  {Refresh Note OR Click here to enter BP  :1}***         Physical Exam:    VS:  There were no vitals taken for this visit.    Wt Readings from Last 3 Encounters:  05/14/23 244 lb (110.7 kg)  04/07/23 243 lb 9.6 oz (110.5 kg)  11/25/22 245 lb (111.1 kg)     GEN: *** Well nourished, well developed in no acute distress HEENT: Normal NECK: No JVD; No carotid bruits LYMPHATICS: No lymphadenopathy CARDIAC: ***RRR, no murmurs, rubs, gallops RESPIRATORY:  Clear to auscultation without rales, wheezing or rhonchi  ABDOMEN: Soft, non-tender, non-distended MUSCULOSKELETAL:  No edema; No deformity  SKIN: Warm and dry NEUROLOGIC:  Alert and oriented x 3 PSYCHIATRIC:  Normal affect   ASSESSMENT:    No diagnosis found. PLAN:    In order of problems listed above:  ***      {Are you ordering a CV Procedure (e.g. stress test, cath, DCCV, TEE, etc)?   Press F2        :[id number]}    Medication Adjustments/Labs and Tests Ordered: Current medicines are reviewed at length with the patient today.  Concerns regarding medicines are outlined above.  No orders of the defined types were placed in this encounter.  No orders of the defined types were placed in this encounter.   There are no Patient Instructions on file for this visit.   Signed, Marcelino Duster, [place]  06/17/2023 9:19 AM    [facility]

## 2023-06-21 NOTE — Progress Notes (Unsigned)
Cardiology Office Note    Date:  06/22/2023  ID:  Cindy Hammond, DOB [birth year], MRN [id number] PCP:  Marianne Sofia, PA-C  Cardiologist:  Thomasene Ripple, DO  Electrophysiologist:  None   Chief Complaint: Follow up for nonobstructive CAD   History of Present Illness: .    Cindy Hammond is a 57 y.o. female with visit-pertinent history of nonobstructive coronary artery disease by CCTA in 09/2020, hyperlipidemia, hypertension and diastolic dysfunction, fibromyalgia.  She was last seen by Dr. Servando Salina on 11/25/2022.  She reported worsening shortness of breath on exertion, this was felt to likely be multifactorial given obesity, deconditioning and OSA.  Dr. Servando Salina recommended that she follow-up with her pulmonary physician slowly readjust her CPAP she also recommended a repeat echocardiogram.  Echocardiogram on 12/05/2022 indicated LVEF of 60 to 65%, LV with normal function, no RWMA, G1 DD, RV function was normal, RV was mildly enlarged, no significant valvular abnormalities.  Today she presents for follow up. She reports that she has been doing well. She reports intermittent chest discomfort, she does not associate this with exertion, occurs mostly at rest.  Possibly related to her fibromyalgia or musculoskeletal pain, as it is reproducible on palpation. She has chronic dyspnea on exertion, improved since last visit with Dr. Servando Salina.  She notes that she has not been able to wear her CPAP recently as it was recalled.  She is working with her pulmonologist to obtain a new CPAP.  She notes she might have to undergo a repeat sleep study.  Labwork independently reviewed: 05/14/2023: Hemoglobin 15.4, hematocrit 45.5 04/07/2023: Potassium 4.1, sodium 142, creatinine 0.94, AST 18, ALT 26, total cholesterol 209, triglycerides 271, HDL 44 and LDL 118 ROS: .   Today she denies chest pain, shortness of breath, lower extremity edema, fatigue, palpitations, melena, hematuria, hemoptysis, diaphoresis, weakness, presyncope, syncope,  orthopnea, and PND.  All other systems are reviewed and otherwise negative.  Studies Reviewed: Marland Kitchen    EKG:  EKG is ordered today, personally reviewed, demonstrating  EKG Interpretation Date/Time:  Monday June 22 2023 15:58:50 EDT Ventricular Rate:  95 PR Interval:  156 QRS Duration:  82 QT Interval:  350 QTC Calculation: 439 R Axis:   69  Text Interpretation: Normal sinus rhythm Nonspecific T wave abnormality No significant change since last tracing Confirmed by Reather Littler [phone]) on 06/22/2023 4:51:53 PM   CV Studies: Cardiac studies reviewed are outlined and summarized above. Otherwise please see EMR for full report. Cardiac Studies & Procedures       ECHOCARDIOGRAM  ECHOCARDIOGRAM COMPLETE 12/05/2022  Narrative ECHOCARDIOGRAM REPORT    Patient Name:   Cindy Hammond Date of Exam: 12/05/2022 Medical Rec #:  [id number]   Height:       60.0 in Accession #:    [id number]  Weight:       245.0 lb Date of Birth:  04/21/1966   BSA:          2.035 m Patient Age:    57 years    BP:           132/78 mmHg Patient Gender: F           HR:           83 bpm. Exam Location:  [facility]  Procedure: 2D Echo, Cardiac Doppler, Color Doppler and Strain Analysis  Indications:    SOB (shortness of breath) [R06.02 (ICD-10-CM)]  History:        Patient has prior history of Echocardiogram examinations, most recent 09/25/2020.  Signs/Symptoms:Dyspnea; Risk Factors:Hypertension and Dyslipidemia.  Sonographer:    Margreta Journey RDCS Referring Phys: [id number] KARDIE TOBB   Sonographer Comments: Image acquisition challenging due to patient body habitus. IMPRESSIONS   1. Left ventricular ejection fraction, by estimation, is 60 to 65%. The left ventricle has normal function. The left ventricle has no regional wall motion abnormalities. Left ventricular diastolic parameters are consistent with Grade I diastolic dysfunction (impaired relaxation). GLS-19.0% 2. Right ventricular systolic function is  normal. The right ventricular size is mildly enlarged. 3. The mitral valve is normal in structure. No evidence of mitral valve regurgitation. No evidence of mitral stenosis. 4. The aortic valve is normal in structure. Aortic valve regurgitation is not visualized. No aortic stenosis is present. 5. The inferior vena cava is normal in size with greater than 50% respiratory variability, suggesting right atrial pressure of 3 mmHg.  FINDINGS Left Ventricle: Left ventricular ejection fraction, by estimation, is 60 to 65%. The left ventricle has normal function. The left ventricle has no regional wall motion abnormalities. The left ventricular internal cavity size was normal in size. There is no left ventricular hypertrophy. Left ventricular diastolic parameters are consistent with Grade I diastolic dysfunction (impaired relaxation).  Right Ventricle: The right ventricular size is mildly enlarged. No increase in right ventricular wall thickness. Right ventricular systolic function is normal.  Left Atrium: Left atrial size was normal in size.  Right Atrium: Right atrial size was normal in size.  Pericardium: There is no evidence of pericardial effusion.  Mitral Valve: The mitral valve is normal in structure. No evidence of mitral valve regurgitation. No evidence of mitral valve stenosis.  Tricuspid Valve: The tricuspid valve is normal in structure. Tricuspid valve regurgitation is not demonstrated. No evidence of tricuspid stenosis.  Aortic Valve: The aortic valve is normal in structure. Aortic valve regurgitation is not visualized. No aortic stenosis is present.  Pulmonic Valve: The pulmonic valve was normal in structure. Pulmonic valve regurgitation is not visualized. No evidence of pulmonic stenosis.  Aorta: The aortic root is normal in size and structure.  Venous: The inferior vena cava is normal in size with greater than 50% respiratory variability, suggesting right atrial pressure of 3  mmHg.  IAS/Shunts: No atrial level shunt detected by color flow Doppler.   LEFT VENTRICLE PLAX 2D LVIDd:         3.70 cm   Diastology LVIDs:         2.40 cm   LV e' medial:    7.76 cm/s LV PW:         1.00 cm   LV E/e' medial:  11.6 LV IVS:        1.00 cm   LV e' lateral:   10.83 cm/s LVOT diam:     1.90 cm   LV E/e' lateral: 8.3 LV SV:         54 LV SV Index:   26 LVOT Area:     2.84 cm   RIGHT VENTRICLE RV Basal diam:  3.00 cm RV S prime:     11.13 cm/s TAPSE (M-mode): 2.0 cm  LEFT ATRIUM           Index        RIGHT ATRIUM           Index LA diam:      2.70 cm 1.33 cm/m   RA Area:     10.90 cm LA Vol (A4C): 25.9 ml 12.73 ml/m  RA Volume:   21.60 ml  10.62 ml/m AORTIC VALVE LVOT Vmax:   99.03 cm/s LVOT Vmean:  63.933 cm/s LVOT VTI:    0.189 m  AORTA Ao Root diam: 2.80 cm Ao Asc diam:  2.50 cm Ao Desc diam: 1.90 cm  MITRAL VALVE MV Area (PHT): 4.03 cm     SHUNTS MV Decel Time: 188 msec     Systemic VTI:  0.19 m MV E velocity: 90.07 cm/s   Systemic Diam: 1.90 cm MV A velocity: 111.00 cm/s MV E/A ratio:  0.81  Glean Hess Revankar MD Electronically signed by Belva Crome MD Signature Date/Time: 12/05/2022/2:53:40 PM    Final    MONITORS  LONG TERM MONITOR (3-14 DAYS) 10/17/2021  Narrative Patch Wear Time:  1 days and 11 hours (2023-02-09T22:20:27-0500 to 2023-02-11T10:12:41-0500)  Patient had a min HR of 67 bpm, max HR of 112 bpm, and avg HR of 88 bpm. Predominant underlying rhythm was Sinus Rhythm. Isolated SVEs were rare (<1.0%), and no SVE Couplets or SVE Triplets were present. No Isolated VEs, VE Couplets, or VE Triplets were present.  Symptoms associated with sinus rhythm and sinus tachycardia.   Conclusion: Normal/unremarkable study.   CT SCANS  CT CORONARY MORPH W/CTA COR W/SCORE 09/10/2020  Addendum 09/11/2020  1:16 PM ADDENDUM REPORT: 09/11/2020 13:14  CLINICAL DATA:  This is a 57 year old female with chest pain.  EXAM: Cardiac/Coronary   CT  TECHNIQUE: The patient was scanned on a [employer].  FINDINGS: A 120 kV retrospective scan was triggered in the descending thoracic aorta at 111 HU's. Axial non-contrast 3 mm slices were carried out through the heart. The data set was analyzed on a dedicated work station and scored using the Agatson method. Gantry rotation speed was 250 msecs and collimation was .6 mm. No beta blockade and 0.8 mg of sl NTG was given. The 3D data set was reconstructed in 5% intervals of the 67-82 % of the R-R cycle. Diastolic phases were analyzed on a dedicated work station using MPR, MIP and VRT modes. The patient received 80 cc of contrast.  Aorta: Normal size.  No calcifications.  No dissection.  Aortic Valve:  Trileaflet.  No calcifications.  Coronary Arteries:  Normal coronary origin.  Right dominance.  RCA is a large dominant artery that gives rise to PDA and PLVB. There is no plaque.  Left main is a large artery that gives rise to LAD and LCX arteries.  LAD is a large vessel. There is minimal soft plaque in the mid LAD however this is not fully quantified due to stair-step artifact- will send for FFRct to assess flow.  LCX is a non-dominant artery that gives rise to one large OM1 branch. There is minimal calcified plaque in the proximal LCX.  Other findings:  Normal pulmonary vein drainage into the left atrium.  Normal left atrial appendage without a thrombus.  Normal size of the pulmonary artery.  IMPRESSION: 1. Coronary calcium score of 18.2. This was 84 percentile for age and sex matched control.  2. Normal coronary origin with right dominance.  3.Minimal CAD. CADRADS 1. This study will be send for FFRct due to poor quantification the suspected minimal soft LAD plaque.  Thomasene Ripple, DO   Electronically Signed By: Thomasene Ripple DO On: 09/11/2020 13:14  Narrative EXAM: OVER-READ INTERPRETATION  CT CHEST  The following report is an over-read performed  by radiologist Dr. Jeronimo Greaves of [facility] on 09/10/2020. This over-read does not include interpretation of cardiac or coronary anatomy or  pathology. The coronary CTA interpretation by the cardiologist is attached.  COMPARISON:  01/12/2019 chest radiograph [facility].  FINDINGS: Vascular: Aortic atherosclerosis. No central pulmonary embolism, on this non-dedicated study.  Mediastinum/Nodes: No imaged thoracic adenopathy.  Lungs/Pleura: No pleural fluid. 4 mm right middle lobe pulmonary nodule on 21/15.  3 mm right lower lobe pulmonary nodule on 28/15.  A 4 mm lingular nodule on 12/15.  Upper Abdomen: Hepatic steatosis. Normal imaged portions of the spleen.  Musculoskeletal: No acute osseous abnormality.  IMPRESSION: No acute findings in the imaged extracardiac chest.  Bilateral pulmonary nodules of maximally 4 mm. No follow-up needed if patient is low-risk. Non-contrast chest CT can be considered in 12 months if patient is high-risk. This recommendation follows the consensus statement: Guidelines for Management of Incidental Pulmonary Nodules Detected on CT Images: From the Fleischner Society 2017; Radiology 2017; 284:228-243.  Aortic Atherosclerosis (ICD10-I70.0).  Electronically Signed: By: Jeronimo Greaves M.D. On: 09/10/2020 15:12           Current Reported Medications:.    Current Meds  Medication Sig   albuterol (VENTOLIN HFA) 108 (90 Base) MCG/ACT inhaler INHALE 1-2 PUFFS INTO THE LUNGS EVERY 6 HOURS AS NEEDED FOR WHEEZING OR SHORTNESS OF BREATH   amitriptyline (ELAVIL) 150 MG tablet TAKE 1 TABLET BY MOUTH DAILY AT BEDTIME.   atenolol (TENORMIN) 25 MG tablet Take 1 tablet (25 mg total) by mouth 2 (two) times daily.   atorvastatin (LIPITOR) 80 MG tablet Take 1 tablet (80 mg total) by mouth daily.   busPIRone (BUSPAR) 30 MG tablet Take 1 tablet (30 mg total) by mouth 2 (two) times daily.   clonazePAM (KLONOPIN) 0.5 MG tablet TAKE  ONE TABLET BY MOUTH TWICE DAILY   dicyclomine (BENTYL) 20 MG tablet TAKE 1 TABLET BY MOUTH THREE(3) TIMES DAILY BEFORE MEALS   Erenumab-aooe (AIMOVIG, 140 MG DOSE, [place]) Inject into the skin.   furosemide (LASIX) 40 MG tablet TAKE 1 TABLET BY MOUTH ONCE DAILY   KRILL OIL PO Take 1 capsule by mouth 2 (two) times daily.   nitroGLYCERIN (NITROSTAT) 0.4 MG SL tablet PLACE 1 TABLET UNDER THE TONGUE AS NEEDED FOR CHEST PAIN ( MAY REPEAT EVERY5 MINUTES X 3)   omeprazole (PRILOSEC) 40 MG capsule Take 1 capsule (40 mg total) by mouth daily.   pregabalin (LYRICA) 75 MG capsule Take 1 capsule by mouth 3 times daily.   QUEtiapine (SEROQUEL) 300 MG tablet Take 1 tablet by mouth once daily. AT BEDTIME   ranolazine (RANEXA) 500 MG 12 hr tablet TAKE 1 TABLET BY MOUTH TWICE(2) DAILY   topiramate (TOPAMAX) 100 MG tablet Take 1 tablet by mouth 3 times daily.   Ubrogepant (UBRELVY) 100 MG TABS Take 100 mg by mouth 2 (two) times daily as needed (migraine).   Vitamin D, Ergocalciferol, 50000 units CAPS Take 1 capsule by mouth twice weekly as directed.   Physical Exam:   VS:  BP 118/72   Pulse 86   Ht 5' (1.524 m)   Wt 242 lb 6.4 oz (110 kg)   SpO2 96%   BMI 47.34 kg/m    Wt Readings from Last 3 Encounters:  06/22/23 242 lb 6.4 oz (110 kg)  05/14/23 244 lb (110.7 kg)  04/07/23 243 lb 9.6 oz (110.5 kg)    GEN: Well nourished, well developed in no acute distress NECK: No JVD; No carotid bruits CARDIAC: RRR, no murmurs, rubs, gallops RESPIRATORY:  Clear to auscultation without rales, wheezing or rhonchi  ABDOMEN:  Soft, non-tender, non-distended EXTREMITIES:  No edema; No acute deformity   Asessement and Plan:.    CAD: Nonobstructive by CCTA in 09/2020, coronary calcium score of 18.2 (88th percentile).  Today she reports intermittent chest pain not associated with exertion, this is reproducible on palpation, likely musculoskeletal in nature or related to her fibromyalgia, overall not concerning for angina.   She reports that her dyspnea on exertion is at her baseline, improved since last visit with Dr. Servando Salina. Heart healthy diet and regular cardiovascular exercise encouraged.   Continue aspirin 81 mg daily, Ranexa and atenolol.   OSA: Patient reports that she has been unable to wear her CPAP as it was recalled, likely contributing to her shortness of breath.  She is working with her pulmonologist to possibly repeat a sleep study in order to obtain new CPAP.  Chronic diastolic HF: Last echo on 12/05/2022 indicated LVEF 60 to 65%, G1 DD.  Today she appears euvolemic and well compensated on exam.  Continue atenolol 25 mg twice daily and Lasix 40 mg once daily.  Hypertension: Blood pressure well controlled today at 118/72. Continue atenolol.   Hyperlipidemia with LDL goal less than 70: Last lipid profile on 04/07/2023 indicated total cholesterol 209, triglycerides 271, HDL 44, LDL 118. Patient today reports that her labs were not fasting.  Will have her repeat fasting lipid profile.  Continue Lipitor 80 mg daily pending labs.  Disposition: F/u with Dr. Servando Salina or Reather Littler, NP in 6 months or sooner if needed.   Signed, Rip Harbour, NP

## 2023-06-22 ENCOUNTER — Ambulatory Visit: Payer: Medicaid Other | Attending: Cardiology | Admitting: Cardiology

## 2023-06-22 ENCOUNTER — Encounter: Payer: Self-pay | Admitting: Physician Assistant

## 2023-06-22 VITALS — BP 118/72 | HR 86 | Ht 60.0 in | Wt 242.4 lb

## 2023-06-22 DIAGNOSIS — E782 Mixed hyperlipidemia: Secondary | ICD-10-CM

## 2023-06-22 DIAGNOSIS — I5189 Other ill-defined heart diseases: Secondary | ICD-10-CM

## 2023-06-22 DIAGNOSIS — I10 Essential (primary) hypertension: Secondary | ICD-10-CM | POA: Diagnosis not present

## 2023-06-22 DIAGNOSIS — G4733 Obstructive sleep apnea (adult) (pediatric): Secondary | ICD-10-CM | POA: Diagnosis not present

## 2023-06-22 DIAGNOSIS — I2510 Atherosclerotic heart disease of native coronary artery without angina pectoris: Secondary | ICD-10-CM | POA: Diagnosis not present

## 2023-06-22 NOTE — Patient Instructions (Signed)
Medication Instructions:  The current medical regimen is effective;  continue present plan and medications as directed. Please refer to the Current Medication list given to you today.  *If you need a refill on your cardiac medications before your next appointment, please call your pharmacy*  Lab Work: FASTING LIPID-NEXT WEEK OK If you have labs (blood work) drawn today and your tests are completely normal, you will receive your results only by:   MyChart Message (if you have MyChart) OR  A paper copy in the mail If you have any lab test that is abnormal or we need to change your treatment, we will call you to review the results.  Follow-Up: At [facility], you and your health needs are our priority.  As part of our continuing mission to provide you with exceptional heart care, we have created designated Provider Care Teams.  These Care Teams include your primary Cardiologist (physician) and Advanced Practice Providers (APPs -  Physician Assistants and Nurse Practitioners) who all work together to provide you with the care you need, when you need it.  Your next appointment:   6 month(s)  Provider:   Thomasene Ripple, DO  or Reather Littler, NP        Other Instructions

## 2023-06-23 ENCOUNTER — Encounter: Payer: Self-pay | Admitting: Cardiology

## 2023-06-25 DIAGNOSIS — E782 Mixed hyperlipidemia: Secondary | ICD-10-CM | POA: Diagnosis not present

## 2023-06-25 DIAGNOSIS — I10 Essential (primary) hypertension: Secondary | ICD-10-CM | POA: Diagnosis not present

## 2023-06-25 DIAGNOSIS — I2510 Atherosclerotic heart disease of native coronary artery without angina pectoris: Secondary | ICD-10-CM | POA: Diagnosis not present

## 2023-06-25 LAB — LIPID PANEL
Chol/HDL Ratio: 5.1 ratio — ABNORMAL HIGH (ref 0.0–4.4)
Cholesterol, Total: 215 mg/dL — ABNORMAL HIGH (ref 100–199)
HDL: 42 mg/dL (ref >39)
LDL Chol Calc (NIH): 127 mg/dL — ABNORMAL HIGH (ref 0–99)
Triglycerides: 259 mg/dL — ABNORMAL HIGH (ref 0–149)
VLDL Cholesterol Cal: 46 mg/dL — ABNORMAL HIGH (ref 5–40)

## 2023-06-26 ENCOUNTER — Telehealth: Payer: Self-pay

## 2023-06-26 DIAGNOSIS — E782 Mixed hyperlipidemia: Secondary | ICD-10-CM

## 2023-06-26 NOTE — Telephone Encounter (Signed)
-----   Message from Rip Harbour sent at 06/26/2023 10:12 AM EDT ----- Please let Cindy Hammond know that her lipid profile shows that her LDL is 127, goal is less than 70 for her. Her total cholesterol and triglycerides were also elevated. Please confirm she was fasting and confirm she has been taking her atorvastatin 80 mg daily as directed. If so would recommend referral to pharm D lipid clinic.

## 2023-06-26 NOTE — Telephone Encounter (Signed)
Called patient advised of below they verbalized understanding. Will send this message to scheduling to schedule patient.

## 2023-06-29 ENCOUNTER — Other Ambulatory Visit: Payer: Self-pay | Admitting: Physician Assistant

## 2023-06-29 DIAGNOSIS — F419 Anxiety disorder, unspecified: Secondary | ICD-10-CM

## 2023-07-13 ENCOUNTER — Encounter: Payer: Self-pay | Admitting: Cardiology

## 2023-07-13 ENCOUNTER — Telehealth: Payer: Self-pay | Admitting: Physician Assistant

## 2023-07-13 ENCOUNTER — Encounter: Payer: Self-pay | Admitting: Physician Assistant

## 2023-07-13 DIAGNOSIS — R079 Chest pain, unspecified: Secondary | ICD-10-CM | POA: Diagnosis not present

## 2023-07-13 DIAGNOSIS — G4733 Obstructive sleep apnea (adult) (pediatric): Secondary | ICD-10-CM | POA: Diagnosis not present

## 2023-07-13 DIAGNOSIS — I509 Heart failure, unspecified: Secondary | ICD-10-CM | POA: Diagnosis not present

## 2023-07-13 DIAGNOSIS — I10 Essential (primary) hypertension: Secondary | ICD-10-CM | POA: Diagnosis not present

## 2023-07-13 NOTE — Telephone Encounter (Signed)
Please notify pt to go immediately to hospital ED for further evaluation and to never wait with symptoms like this--- also tell pt to  call and speak to someone regarding these issues instead of typing out a message

## 2023-07-13 NOTE — Telephone Encounter (Signed)
Sent to wrong office °

## 2023-07-13 NOTE — Telephone Encounter (Unsigned)
Copied from CRM [id number]. Topic: Medical Record Request - Attorney/Litigation >> Jul 10, 2023  4:19 PM Lorin Glass B wrote: Reason for CRM: Kandee Keen from patients attorneys office called to confirm or expedite records request due to records needed by 07/17/23. Stated that they are looking for records including any/all CPAP devices with proof of purchase or use. Also any insurance documents 2016- present. Ref# [id number]. Call back # is [phone] ext [phone].  Route to Research officer, political party. >> Jul 10, 2023  4:38 PM Lorin Glass B wrote: Fax number to send records if missing form initial request. [phone]

## 2023-07-13 NOTE — Telephone Encounter (Signed)
Copied from CRM [id number]. Topic: Medical Record Request - Attorney/Litigation >> Jul 10, 2023  4:19 PM Lorin Glass B wrote: Reason for CRM: Kandee Keen from patients attorneys office called to confirm or expedite records request due to records needed by 07/17/23. Stated that they are looking for records including any/all CPAP devices with proof of purchase or use. Also any insurance documents 2016- present. Ref# [id number]. Call back # is [phone] ext [phone].  Route to Research officer, political party. >> Jul 10, 2023  4:38 PM Lorin Glass B wrote: Fax number to send records if missing form initial request. [phone]

## 2023-07-13 NOTE — Telephone Encounter (Signed)
Patient informed and verbalized understanding

## 2023-07-14 DIAGNOSIS — I509 Heart failure, unspecified: Secondary | ICD-10-CM | POA: Diagnosis not present

## 2023-07-14 DIAGNOSIS — G4733 Obstructive sleep apnea (adult) (pediatric): Secondary | ICD-10-CM | POA: Diagnosis not present

## 2023-07-14 DIAGNOSIS — R079 Chest pain, unspecified: Secondary | ICD-10-CM | POA: Diagnosis not present

## 2023-07-14 DIAGNOSIS — I10 Essential (primary) hypertension: Secondary | ICD-10-CM | POA: Diagnosis not present

## 2023-07-15 ENCOUNTER — Encounter: Payer: Self-pay | Admitting: Physician Assistant

## 2023-07-15 DIAGNOSIS — G4733 Obstructive sleep apnea (adult) (pediatric): Secondary | ICD-10-CM | POA: Diagnosis not present

## 2023-07-15 DIAGNOSIS — R079 Chest pain, unspecified: Secondary | ICD-10-CM | POA: Diagnosis not present

## 2023-07-15 DIAGNOSIS — I509 Heart failure, unspecified: Secondary | ICD-10-CM | POA: Diagnosis not present

## 2023-07-15 DIAGNOSIS — I10 Essential (primary) hypertension: Secondary | ICD-10-CM | POA: Diagnosis not present

## 2023-07-16 DIAGNOSIS — M25561 Pain in right knee: Secondary | ICD-10-CM | POA: Diagnosis not present

## 2023-07-16 DIAGNOSIS — M1711 Unilateral primary osteoarthritis, right knee: Secondary | ICD-10-CM | POA: Diagnosis not present

## 2023-07-17 ENCOUNTER — Ambulatory Visit: Payer: Medicaid Other | Admitting: Physician Assistant

## 2023-07-20 ENCOUNTER — Other Ambulatory Visit: Payer: Self-pay | Admitting: Physician Assistant

## 2023-07-20 DIAGNOSIS — E559 Vitamin D deficiency, unspecified: Secondary | ICD-10-CM

## 2023-07-23 ENCOUNTER — Encounter: Payer: Self-pay | Admitting: Physician Assistant

## 2023-07-23 ENCOUNTER — Inpatient Hospital Stay: Payer: Medicaid Other | Admitting: Physician Assistant

## 2023-07-23 ENCOUNTER — Ambulatory Visit: Payer: Medicaid Other | Admitting: Physician Assistant

## 2023-07-23 VITALS — BP 112/82 | HR 83 | Temp 97.6°F | Ht 60.0 in | Wt 239.0 lb

## 2023-07-23 DIAGNOSIS — N3001 Acute cystitis with hematuria: Secondary | ICD-10-CM

## 2023-07-23 DIAGNOSIS — R300 Dysuria: Secondary | ICD-10-CM | POA: Diagnosis not present

## 2023-07-23 HISTORY — DX: Acute cystitis with hematuria: N30.01

## 2023-07-23 LAB — POCT URINALYSIS DIP (CLINITEK)
Bilirubin, UA: NEGATIVE
Glucose, UA: NEGATIVE mg/dL
Ketones, POC UA: NEGATIVE mg/dL
Nitrite, UA: NEGATIVE
POC PROTEIN,UA: NEGATIVE
Spec Grav, UA: 1.015 (ref 1.010–1.025)
Urobilinogen, UA: NEGATIVE U/dL — AB
pH, UA: 6 (ref 5.0–8.0)

## 2023-07-23 MED ORDER — DOXYCYCLINE HYCLATE 100 MG PO TABS: 100.0000 mg | ORAL_TABLET | Freq: Two times a day (BID) | ORAL | 0 refills | Status: DC

## 2023-07-23 NOTE — Progress Notes (Signed)
Acute Office Visit  Subjective:    Patient ID: Cindy Hammond, female    DOB: 05-Apr-1966, 57 y.o.   MRN: [id number]  Chief Complaint  Patient presents with   Dysuria    HPI: Patient is in today for complaints of urine urgency, dysuria, and urine odor - denies fever or abdominal pain She states she has been bothered with the symptoms for a few weeks but worse in the past few days Denies vaginal symptoms   Current Outpatient Medications:    albuterol (VENTOLIN HFA) 108 (90 Base) MCG/ACT inhaler, INHALE 1-2 PUFFS INTO THE LUNGS EVERY 6 HOURS AS NEEDED FOR WHEEZING OR SHORTNESS OF BREATH, Disp: 8.5 g, Rfl: 2   amitriptyline (ELAVIL) 150 MG tablet, TAKE 1 TABLET BY MOUTH DAILY AT BEDTIME., Disp: 90 tablet, Rfl: 1   atenolol (TENORMIN) 25 MG tablet, Take 1 tablet (25 mg total) by mouth 2 (two) times daily., Disp: 180 tablet, Rfl: 3   atorvastatin (LIPITOR) 80 MG tablet, Take 1 tablet (80 mg total) by mouth daily., Disp: 90 tablet, Rfl: 1   busPIRone (BUSPAR) 30 MG tablet, Take 1 tablet (30 mg total) by mouth 2 (two) times daily., Disp: 180 tablet, Rfl: 1   clonazePAM (KLONOPIN) 0.5 MG tablet, TAKE ONE TABLET BY MOUTH TWICE DAILY, Disp: 60 tablet, Rfl: 1   dicyclomine (BENTYL) 20 MG tablet, TAKE 1 TABLET BY MOUTH THREE(3) TIMES DAILY BEFORE MEALS, Disp: 90 tablet, Rfl: 1   doxycycline (VIBRA-TABS) 100 MG tablet, Take 1 tablet (100 mg total) by mouth 2 (two) times daily., Disp: 20 tablet, Rfl: 0   Erenumab-aooe (AIMOVIG, 140 MG DOSE, [place]), Inject into the skin., Disp: , Rfl:    furosemide (LASIX) 40 MG tablet, TAKE 1 TABLET BY MOUTH ONCE DAILY, Disp: 90 tablet, Rfl: 1   KRILL OIL PO, Take 1 capsule by mouth 2 (two) times daily., Disp: , Rfl:    nitroGLYCERIN (NITROSTAT) 0.4 MG SL tablet, PLACE 1 TABLET UNDER THE TONGUE AS NEEDED FOR CHEST PAIN ( MAY REPEAT EVERY5 MINUTES X 3), Disp: 25 tablet, Rfl: 3   omeprazole (PRILOSEC) 40 MG capsule, Take 1 capsule (40 mg total) by mouth daily., Disp: 90  capsule, Rfl: 1   pregabalin (LYRICA) 75 MG capsule, Take 1 capsule by mouth 3 times daily., Disp: 270 capsule, Rfl: 0   QUEtiapine (SEROQUEL) 300 MG tablet, Take 1 tablet by mouth once daily. AT BEDTIME, Disp: 90 tablet, Rfl: 1   ranolazine (RANEXA) 500 MG 12 hr tablet, TAKE 1 TABLET BY MOUTH TWICE(2) DAILY, Disp: 60 tablet, Rfl: 11   topiramate (TOPAMAX) 100 MG tablet, Take 1 tablet by mouth 3 times daily., Disp: 270 tablet, Rfl: 1   Ubrogepant (UBRELVY) 100 MG TABS, Take 100 mg by mouth 2 (two) times daily as needed (migraine)., Disp: , Rfl:    Vitamin D, Ergocalciferol, 50000 units CAPS, Take 1 capsule by mouth twice weekly as directed., Disp: 10 capsule, Rfl: 2  Allergies  Allergen Reactions   Sumatriptan Hives and Rash   Sulfa Antibiotics     ROS CONSTITUTIONAL: Negative for chills, fatigue, fever,   CARDIOVASCULAR: Negative for chest pain, dizziness, RESPIRATORY: Negative for recent cough and dyspnea.  GASTROINTESTINAL: Negative for abdominal pain, acid reflux symptoms, constipation, diarrhea, nausea and vomiting GU - see HPI.       Objective:    PHYSICAL EXAM:   BP 112/82 (BP Location: Left Arm, Patient Position: Sitting)   Pulse 83   Temp 97.6 F (36.4 C) (Temporal)  Ht 5' (1.524 m)   Wt 239 lb (108.4 kg)   SpO2 96%   BMI 46.68 kg/m    GEN: Well nourished, well developed, in no acute distress   Cardiac: RRR; no murmurs, Respiratory:  normal respiratory rate and pattern with no distress - normal breath sounds with no rales, rhonchi, wheezes or rubs GI: normal bowel sounds, no masses or tenderness  Office Visit on 07/23/2023  Component Date Value Ref Range Status   Glucose, UA 07/23/2023 negative  negative mg/dL Final   Bilirubin, UA 16/06/9603 negative  negative Final   Ketones, POC UA 07/23/2023 negative  negative mg/dL Final   Spec Grav, UA 54/05/8118 1.015  1.010 - 1.025 Final   Blood, UA 07/23/2023 moderate (A)  negative Final   pH, UA 07/23/2023 6.0   5.0 - 8.0 Final   POC PROTEIN,UA 07/23/2023 negative  negative, trace Final   Urobilinogen, UA 07/23/2023 negative (A)  0.2 or 1.0 E.U./dL Final   Nitrite, UA 14/78/2956 Negative  Negative Final   Leukocytes, UA 07/23/2023 Large (3+) (A)  Negative Final        Assessment & Plan:    Acute hemorrhagic cystitis -     POCT URINALYSIS DIP (CLINITEK) -     Urine Culture -     Doxycycline Hyclate; Take 1 tablet (100 mg total) by mouth 2 (two) times daily.  Dispense: 20 tablet; Refill: 0     Follow-up: Return if symptoms worsen or fail to improve.  An After Visit Summary was printed and given to the patient.  Jettie Pagan [facility] [phone]

## 2023-07-24 ENCOUNTER — Ambulatory Visit: Payer: Medicaid Other | Admitting: Physician Assistant

## 2023-07-26 LAB — URINE CULTURE

## 2023-07-28 ENCOUNTER — Ambulatory Visit
Payer: Medicaid Other | Attending: Internal Medicine | Admitting: Pharmacist Clinician (PhC)/ Clinical Pharmacy Specialist

## 2023-07-28 DIAGNOSIS — E782 Mixed hyperlipidemia: Secondary | ICD-10-CM

## 2023-07-28 MED ORDER — FENOFIBRATE 48 MG PO TABS: 48.0000 mg | ORAL_TABLET | Freq: Every day | ORAL | 3 refills | Status: DC

## 2023-07-28 NOTE — Progress Notes (Unsigned)
Office Visit    Patient Name: Cindy Hammond Date of Encounter: 08/02/2023  Primary Care Provider:  Marianne Sofia, PA-C Primary Cardiologist:  Thomasene Ripple, DO  Chief Complaint    Hyperlipidemia, weight management  Significant Past Medical History   HTN Controlled at last visits, on atenolol,   migraine On Bernita Raisin, has been to ED with stroke-like sx, ruled to be migraine  preDM 8/24 A1c 6   OSA Was recommended to reach out to pulmonary and re-start     Allergies  Allergen Reactions   Sumatriptan Hives and Rash   Sulfa Antibiotics     History of Present Illness    Cindy Hammond is a 57 y.o. female patient of Dr Servando Salina, in the office today to discuss options for cholesterol management. She saw Reather Littler, NP on 06/22/23. She was having some intermittent chest discomfort that occurred mostly at rest, possibly due to fibromyalgia. She has chronic SOB on exertion, which showed improvements during her visit. Her recent lipid panel showed LDL of 127 and TG of 259. She is currently on atorvastatin 80 mg daily.  Today the patient is seen for hyperlipidemia follow-up. Overall, she is doing well with her medications. She has been experiencing some chest pain and was hospitalized a couple of weeks ago.   She is tolerating atorvastatin 80 mg. She is taking vitamin D and krill oil. She mentions that she is interested in weight loss medication.  During her visit she also asked about possible use of weight management medications.  She has never taken these medications in the past, but has struggled with weight for some time.    Insurance Carrier: Medicaid [employer]  Current meds that may affect weight: quetiapine, pregabalin  Baseline weight/BMI: 239 lb // 46.68  Confirmed patient not pregnant and no personal or family history of medullary thyroid carcinoma (MTC) or Multiple Endocrine Neoplasia syndrome type 2 (MEN 2).   LDL Cholesterol goal:  LDL < 70  Current Cholesterol Medications:    atorvastatin 80  Family Hx:  Mother (alive)-HLD, HTN Father (alive)- HLD, HTN  Social Hx: Tobacco: no Alcohol: no     Diet:    She enjoys eating grilled chicken and vegetables. For snacks, she will eat chips or cheez-its. She is working on reducing her Sprite intake.  Exercise: She mentions that she has osteoarthritis in her knees that is painful and limits her physical activity.   Accessory Clinical Findings   Lab Results  Component Value Date   CHOL 215 (H) 06/25/2023   HDL 42 06/25/2023   LDLCALC 127 (H) 06/25/2023   TRIG 259 (H) 06/25/2023   CHOLHDL 5.1 (H) 06/25/2023    No results found for: "LIPOA"  Lab Results  Component Value Date   ALT 26 04/07/2023   AST 18 04/07/2023   ALKPHOS 200 (H) 04/07/2023   BILITOT 0.4 04/07/2023   Lab Results  Component Value Date   CREATININE 0.94 04/07/2023   BUN 9 04/07/2023   NA 142 04/07/2023   K 4.1 04/07/2023   CL 105 04/07/2023   CO2 21 04/07/2023   Lab Results  Component Value Date   HGBA1C 6.0 (H) 04/07/2023    Home Medications    Current Outpatient Medications  Medication Sig Dispense Refill   fenofibrate (TRICOR) 48 MG tablet Take 1 tablet (48 mg total) by mouth daily. 90 tablet 3   albuterol (VENTOLIN HFA) 108 (90 Base) MCG/ACT inhaler INHALE 1-2 PUFFS INTO THE LUNGS EVERY 6 HOURS AS NEEDED  FOR WHEEZING OR SHORTNESS OF BREATH 8.5 g 2   amitriptyline (ELAVIL) 150 MG tablet TAKE 1 TABLET BY MOUTH DAILY AT BEDTIME. 90 tablet 1   atenolol (TENORMIN) 25 MG tablet Take 1 tablet (25 mg total) by mouth 2 (two) times daily. 180 tablet 3   atorvastatin (LIPITOR) 80 MG tablet Take 1 tablet (80 mg total) by mouth daily. 90 tablet 1   busPIRone (BUSPAR) 30 MG tablet Take 1 tablet (30 mg total) by mouth 2 (two) times daily. 180 tablet 1   clonazePAM (KLONOPIN) 0.5 MG tablet TAKE ONE TABLET BY MOUTH TWICE DAILY 60 tablet 1   dicyclomine (BENTYL) 20 MG tablet TAKE 1 TABLET BY MOUTH THREE(3) TIMES DAILY BEFORE MEALS 90 tablet 1    doxycycline (VIBRA-TABS) 100 MG tablet Take 1 tablet (100 mg total) by mouth 2 (two) times daily. 20 tablet 0   Erenumab-aooe (AIMOVIG, 140 MG DOSE, [place]) Inject into the skin.     furosemide (LASIX) 40 MG tablet TAKE 1 TABLET BY MOUTH ONCE DAILY 90 tablet 1   KRILL OIL PO Take 1 capsule by mouth 2 (two) times daily.     nitroGLYCERIN (NITROSTAT) 0.4 MG SL tablet PLACE 1 TABLET UNDER THE TONGUE AS NEEDED FOR CHEST PAIN ( MAY REPEAT EVERY5 MINUTES X 3) 25 tablet 3   omeprazole (PRILOSEC) 40 MG capsule Take 1 capsule (40 mg total) by mouth daily. 90 capsule 1   pregabalin (LYRICA) 75 MG capsule Take 1 capsule by mouth 3 times daily. 270 capsule 0   QUEtiapine (SEROQUEL) 300 MG tablet Take 1 tablet by mouth once daily. AT BEDTIME 90 tablet 1   ranolazine (RANEXA) 500 MG 12 hr tablet TAKE 1 TABLET BY MOUTH TWICE(2) DAILY 60 tablet 11   topiramate (TOPAMAX) 100 MG tablet Take 1 tablet by mouth 3 times daily. 270 tablet 1   Ubrogepant (UBRELVY) 100 MG TABS Take 100 mg by mouth 2 (two) times daily as needed (migraine).     Vitamin D, Ergocalciferol, 50000 units CAPS Take 1 capsule by mouth twice weekly as directed. 10 capsule 2   No current facility-administered medications for this visit.     Assessment & Plan    Mixed hyperlipidemia A: LDL goal <70 with CAD. TG goal <250. Her recent LDL and TG are 127 and 259, respectively. Currently uncontrolled on atorvastatin 80 mg daily. We recommend adding Repatha 140 mg SQ inj every 2 weeks for LDL lowering. We recommend adding Tricor 48 mg tablets daily for TG lowering. We discussed appropriate diet options for managing weight and cholesterol.   P: Continue with all other medications. The patient will follow up with [place] in 3 months.  Morbid obesity (HCC)  Patient has not met goal of at least 5% of body weight loss with comprehensive lifestyle modifications alone in the past 3-6 months. Pharmacotherapy is appropriate to pursue as augmentation. Will start  Wegovy.   Confirmed patient not pregnant and no personal or family history of medullary thyroid carcinoma (MTC) or Multiple Endocrine Neoplasia syndrome type 2 (MEN 2).   Advised patient on common side effects including nausea, diarrhea, dyspepsia, decreased appetite, and fatigue. Counseled patient on reducing meal size and how to titrate medication to minimize side effects. Patient aware to call if intolerable side effects or if experiencing dehydration, abdominal pain, or dizziness. Patient will adhere to dietary modifications and will target at least 150 minutes of moderate intensity exercise weekly.   Injection technique reviewed at today's visit.  Titration Plan:  Will plan to follow the titration plan as below, pending patient is tolerating each dose before increasing to the next. Can slow titration if needed for tolerability.    -Month 1: Inject 0.25 mg SQ once weekly x 4 weeks -Month 2: Inject 0.5 mg SQ once weekly x 4 weeks -Month 3: Inject 1 mg SQ once weekly x 4 weeks -Month 4: Inject 1.7 mg SQ once weekly   Follow up in 3 months.   Buddy Duty PharmD Candidate  [employer] [facility] Class of 2025  I was with student and patient for visit and agree with above assessment and plan. (Hyperlipidemia).  Plan for weight management was by me.   Phillips Hay, PharmD CPP [facility] [address]  [place], [place] [address] [phone]  08/02/2023, 9:07 AM

## 2023-07-28 NOTE — Patient Instructions (Addendum)
Your Results:             Your most recent labs Goal  Total Cholesterol 215 < 200  Triglycerides 259 < 150  HDL (happy/good cholesterol) 42 > 40  LDL (lousy/bad cholesterol 127 < 70   Medication changes:  We will start the process to get Repatha covered by your insurance.  Once the prior authorization is complete, I will call/send a MyChart message to let you know and confirm pharmacy information.   You will take one injection every 14 days  Start fenofibrate 48 mg once daily with food.  Lab orders:  We want to repeat labs after 2-3 months.  We will send you a lab order to remind you once we get closer to that time.    We will start the prior authorization process to get Wegovy covered by your insurance.   TIPS FOR SUCCESS Write down the reasons why you want to lose weight and post it in a place where you'll see it often. Start small and work your way up. Keep in mind that it takes time to achieve goals, and small steps add up. Any additional movements help to burn calories. Taking the stairs rather than the elevator and parking at the far end of your parking lot are easy ways to start. Brisk walking for at least 30 minutes 4 or more days of the week is an excellent goal to work toward  [employer] WHAT IT MEANS TO FEEL FULL Did you know that it can take 15 minutes or more for your brain to receive the message that you've eaten? That means that, if you eat less food, but consume it slower, you may still feel satisfied. Eating a lot of fruits and vegetables can also help you feel fuller. Eat off of smaller plates so that moderate portions don't seem too small  TITRATION PLAN Will plan to follow the titration plan as below, pending patient is tolerating each dose before increasing to the next. Can slow titration if needed for tolerability.    -Weeks 1-4: Inject 0.25 mg SQ once weekly  -Weeks 5-8: Inject 0.5 mg SQ once weekly  -Weeks 9-12 Inject 1 mg SQ once weekly  -Weeks 13-16:  Inject 1.7 SQ once weekly   Follow up in 3 months.  If you have any questions or concerns, please reach out to [place].  Aubry Rankin/Chris at [phone].  THANK YOU FOR CHOOSING [facility]   Thank you for choosing [facility]   High Triglycerides Eating Plan Triglycerides are a type of fat in the blood. High levels of triglycerides can increase your risk of heart disease and stroke. If your triglyceride levels are high, choosing the right foods can help lower your triglycerides and keep your heart healthy. Work with your health care provider or a dietitian to develop an eating plan that is right for you. What are tips for following this plan? General guidelines  Lose weight, if you are overweight. For most people, losing 5-10 lb (2-5 kg) helps lower triglyceride levels. A weight-loss plan may include: 30 minutes of exercise at least 5 days a week. Reducing the amount of calories, sugar, and fat you eat. Eat a wide variety of fresh fruits, vegetables, and whole grains. These foods are high in fiber. Eat foods that contain healthy fats, such as fatty fish, nuts, seeds, and olive oil. Avoid foods that are high in added sugar, added salt (sodium), and saturated fat. Avoid low-fiber, refined carbohydrates such as white bread, crackers,  noodles, and white rice. Avoid foods with trans fats or partially hydrogenated oils, such as fried foods or stick margarine. If you drink alcohol: Limit how much you have to: 0-1 drink a day for women who are not pregnant. 0-2 drinks a day for men. Your health care provider may recommend that you drink less than these amounts depending on your overall health. Know how much alcohol is in a drink. In the [place] one drink equals one 12 oz bottle of beer (355 mL), one 5 oz glass of wine (148 mL), or one 1 oz glass of hard liquor (44 mL). Reading food labels Check food labels for: The amount of saturated fat. Choose foods with no or very little saturated fat (less  than 2 g). The amount of trans fat. Choose foods with no transfat. The amount of cholesterol. Choose foods that are low in cholesterol. The amount of sodium. Choose foods with less than 140 milligrams (mg) per serving. Shopping Buy dairy products labeled as nonfat (skim) or low-fat (1%). Avoid buying processed or prepackaged foods. These are often high in added sugar, sodium, and fat. Cooking Choose healthy fats when cooking, such as olive oil, avocado oil, or canola oil. Cook foods using lower fat methods, such as baking, broiling, boiling, or grilling. Make your own sauces, dressings, and marinades when possible, instead of buying them. Store-bought sauces, dressings, and marinades are often high in sodium and sugar. Meal planning Eat more home-cooked food and less restaurant, buffet, and fast food. Eat fatty fish at least 2 times each week. Examples of fatty fish include salmon, trout, sardines, mackerel, tuna, and herring. If you eat whole eggs, do not eat more than 4 egg yolks per week.  What foods should I eat?  Fruits All fresh, canned (in natural juice), or frozen fruits. Vegetables Fresh or frozen vegetables. Low-sodium canned vegetables. Grains Whole wheat or whole grain breads, crackers, cereals, and pasta. Unsweetened oatmeal. Bulgur. Barley. Quinoa. Brown rice. Whole wheat flour tortillas. Meats and other proteins Skinless chicken or [place]. Ground chicken or [place]. Lean cuts of pork, trimmed of fat. Fish and seafood, especially salmon, trout, and herring. Egg whites. Dried beans, peas, or lentils. Unsalted nuts or seeds. Unsalted canned beans. Natural peanut or almond butter or other nut butters. Dairy Low-fat dairy products. Skim or low-fat (1%) milk. Reduced fat (2%) and low-sodium cheese. Low-fat ricotta cheese. Low-fat cottage cheese. Plain, low-fat yogurt. Fats and oils Tub margarine without trans fats. Light or reduced-fat mayonnaise. Light or reduced-fat salad  dressings. Avocado. Safflower, olive, sunflower, soybean, and canola oils. The items listed above may not be a complete list of recommended foods and beverages. Talk with your dietitian about what dietary choices are best for you.  What foods should I avoid?  Fruits Sweetened dried fruit. Canned fruit in syrup. Fruit juice. Vegetables Creamed or fried vegetables. Vegetables in a cheese sauce. Grains White bread. White (regular) pasta. White rice. Cornbread. Bagels. Pastries. Crackers that contain trans fat. Meats and other proteins Fatty cuts of meat. Ribs. Chicken wings. Tomasa Blase. Sausage. Bologna. Salami. Chitterlings. Fatback. Hot dogs. Bratwurst. Packaged lunch meats. Dairy Whole or reduced-fat (2%) milk. Half-and-half. Cream cheese. Full-fat or sweetened yogurt. Full-fat cheese. Nondairy creamers. Whipped toppings. Processed cheese or cheese spreads. Cheese curds. Fats and oils Butter. Stick margarine. Lard. Shortening. Ghee. Bacon fat. Tropical oils, such as coconut, palm kernel, or palm oils. Beverages Alcohol. Sweetened drinks, such as soda, lemonade, fruit drinks, or punches. Sweets and desserts Corn syrup. Sugars. Honey.  Molasses. Candy. Jam and jelly. Syrup. Sweetened cereals. Cookies. Pies. Cakes. Donuts. Muffins. Ice cream. Condiments Store-bought sauces, dressings, and marinades that are high in sugar, such as ketchup and barbecue sauce. The items listed above may not be a complete list of foods and beverages you should avoid. Talk with your dietitian about what dietary choices are best for you. Summary High levels of triglycerides can increase the risk of heart disease and stroke. Choosing the right foods can help lower your triglycerides. Eat plenty of fresh fruits, vegetables, and whole grains. Choose low-fat dairy and lean meats. Eat fatty fish at least twice a week. Avoid processed and prepackaged foods with added sugar, sodium, saturated fat, and trans fat. If you need  suggestions or have questions about what types of food are good for you, talk with your health care provider or a dietitian. This information is not intended to replace advice given to you by your health care provider. Make sure you discuss any questions you have with your health care provider. Document Revised: 12/28/2020 Document Reviewed: 12/28/2020 Elsevier Patient Education  2024 [employer].

## 2023-07-29 ENCOUNTER — Other Ambulatory Visit (HOSPITAL_COMMUNITY): Payer: Self-pay

## 2023-07-29 ENCOUNTER — Telehealth: Payer: Self-pay | Admitting: Pharmacist Clinician (PhC)/ Clinical Pharmacy Specialist

## 2023-07-29 ENCOUNTER — Telehealth: Payer: Self-pay | Admitting: Pharmacy Technician

## 2023-07-29 NOTE — Telephone Encounter (Signed)
Please do PA for Repatha and Wegovy/Zepbound

## 2023-07-29 NOTE — Assessment & Plan Note (Addendum)
?  Patient has not met goal of at least 5% of body weight loss with comprehensive lifestyle modifications alone in the past 3-6 months. Pharmacotherapy is appropriate to pursue as augmentation. Will start Wegovy  ? ?Confirmed patient not pregnant and no personal or family history of medullary thyroid carcinoma (MTC) or Multiple Endocrine Neoplasia syndrome type 2 (MEN 2).  ? ?Advised patient on common side effects including nausea, diarrhea, dyspepsia, decreased appetite, and fatigue. Counseled patient on reducing meal size and how to titrate medication to minimize side effects. Patient aware to call if intolerable side effects or if experiencing dehydration, abdominal pain, or dizziness. Patient will adhere to dietary modifications and will target at least 150 minutes of moderate intensity exercise weekly.  ? ?Injection technique reviewed at today's visit. ? ?Titration Plan:  ?Will plan to follow the titration plan as below, pending patient is tolerating each dose before increasing to the next. Can slow titration if needed for tolerability.  ?  ?-Month 1: Inject 0.25 mg SQ once weekly x 4 weeks ?-Month 2: Inject 0.5 mg SQ once weekly x 4 weeks ?-Month 3: Inject 1 mg SQ once weekly x 4 weeks ?-Month 4+: Inject 1.7 mg SQ once weekly  ? ?Follow up in 3 months. ?

## 2023-07-29 NOTE — Assessment & Plan Note (Signed)
A: LDL goal <70 with CAD. TG goal <250. Her recent LDL and TG are 127 and 259, respectively. Currently uncontrolled on atorvastatin 80 mg daily. We recommend adding Repatha 140 mg SQ inj every 2 weeks for LDL lowering. We recommend adding Tricor 48 mg tablets daily for TG lowering. We discussed appropriate diet options for managing weight and cholesterol.   P: Continue with all other medications. The patient will follow up with [place] in 3 months.

## 2023-07-29 NOTE — Telephone Encounter (Signed)
Pharmacy Patient Advocate Encounter   Received notification from Pt Calls Messages that prior authorization for wegovy is required/requested.   Insurance verification completed.   The patient is insured through [employer] .   Per test claim: PA required; PA submitted to above mentioned insurance via CoverMyMeds Key/confirmation #/EOC [id number] Status is pending

## 2023-07-29 NOTE — Telephone Encounter (Signed)
Pharmacy Patient Advocate Encounter  Received notification from [facility] that Prior Authorization for Rondel Oh has been APPROVED from 07/29/23 to 01/25/24. Ran test claim, Copay is $4.00- one month. This test claim was processed through [facility]- copay amounts may vary at other pharmacies due to pharmacy/plan contracts, or as the patient moves through the different stages of their insurance plan.   PA #/Case ID/Reference #: [id number]

## 2023-08-01 DIAGNOSIS — J019 Acute sinusitis, unspecified: Secondary | ICD-10-CM | POA: Diagnosis not present

## 2023-08-01 DIAGNOSIS — J029 Acute pharyngitis, unspecified: Secondary | ICD-10-CM | POA: Diagnosis not present

## 2023-08-02 ENCOUNTER — Encounter: Payer: Self-pay | Admitting: Physician Assistant

## 2023-08-02 NOTE — Telephone Encounter (Signed)
Wegovy approved, need pharmacy information

## 2023-08-03 ENCOUNTER — Ambulatory Visit (INDEPENDENT_AMBULATORY_CARE_PROVIDER_SITE_OTHER): Payer: Medicaid Other

## 2023-08-03 ENCOUNTER — Other Ambulatory Visit: Payer: Self-pay | Admitting: Physician Assistant

## 2023-08-03 DIAGNOSIS — N3001 Acute cystitis with hematuria: Secondary | ICD-10-CM

## 2023-08-03 LAB — POCT URINALYSIS DIP (CLINITEK)
Glucose, UA: NEGATIVE mg/dL
Ketones, POC UA: NEGATIVE mg/dL
Nitrite, UA: NEGATIVE
Spec Grav, UA: >=1.03 — AB (ref 1.010–1.025)
Urobilinogen, UA: 0.2 U/dL
pH, UA: 5.5 (ref 5.0–8.0)

## 2023-08-03 MED ORDER — CIPROFLOXACIN HCL 500 MG PO TABS: 500.0000 mg | ORAL_TABLET | Freq: Two times a day (BID) | ORAL | 0 refills | Status: AC

## 2023-08-03 MED ORDER — SEMAGLUTIDE-WEIGHT MANAGEMENT 0.5 MG/0.5ML ~~LOC~~ SOAJ: 0.5000 mg | SUBCUTANEOUS | 0 refills | Status: DC

## 2023-08-03 MED ORDER — SEMAGLUTIDE-WEIGHT MANAGEMENT 0.25 MG/0.5ML ~~LOC~~ SOAJ: 0.2500 mg | SUBCUTANEOUS | 0 refills | Status: DC

## 2023-08-03 MED ORDER — SEMAGLUTIDE-WEIGHT MANAGEMENT 1 MG/0.5ML ~~LOC~~ SOAJ: 1.0000 mg | SUBCUTANEOUS | 0 refills | Status: DC

## 2023-08-03 NOTE — Progress Notes (Signed)
Patient is in office today for a nurse visit for Repeat UA. Patient UA was  positive for WBCs, positive for RBCs, positive for protein, and positive for urobilinogen  Patient stated she os currently taking z-pack and prednisone, she went to the urgent care on Saturday for a sore throat.

## 2023-08-03 NOTE — Addendum Note (Signed)
Addended by: Rosalee Kaufman on: 08/03/2023 07:07 AM   Modules accepted: Orders

## 2023-08-04 LAB — URINE CULTURE

## 2023-08-07 ENCOUNTER — Other Ambulatory Visit: Payer: Self-pay

## 2023-08-07 DIAGNOSIS — G4733 Obstructive sleep apnea (adult) (pediatric): Secondary | ICD-10-CM | POA: Diagnosis not present

## 2023-08-07 DIAGNOSIS — R5383 Other fatigue: Secondary | ICD-10-CM | POA: Diagnosis not present

## 2023-08-07 DIAGNOSIS — N939 Abnormal uterine and vaginal bleeding, unspecified: Secondary | ICD-10-CM

## 2023-08-12 ENCOUNTER — Ambulatory Visit: Payer: Medicaid Other | Admitting: Physician Assistant

## 2023-08-12 ENCOUNTER — Encounter: Payer: Self-pay | Admitting: Physician Assistant

## 2023-08-12 VITALS — BP 124/78 | HR 78 | Temp 97.4°F | Ht 60.0 in | Wt 242.8 lb

## 2023-08-12 DIAGNOSIS — E559 Vitamin D deficiency, unspecified: Secondary | ICD-10-CM

## 2023-08-12 DIAGNOSIS — I10 Essential (primary) hypertension: Secondary | ICD-10-CM

## 2023-08-12 DIAGNOSIS — N939 Abnormal uterine and vaginal bleeding, unspecified: Secondary | ICD-10-CM

## 2023-08-12 DIAGNOSIS — F419 Anxiety disorder, unspecified: Secondary | ICD-10-CM | POA: Diagnosis not present

## 2023-08-12 DIAGNOSIS — E782 Mixed hyperlipidemia: Secondary | ICD-10-CM

## 2023-08-12 DIAGNOSIS — N950 Postmenopausal bleeding: Secondary | ICD-10-CM

## 2023-08-12 DIAGNOSIS — K219 Gastro-esophageal reflux disease without esophagitis: Secondary | ICD-10-CM

## 2023-08-12 DIAGNOSIS — R7303 Prediabetes: Secondary | ICD-10-CM

## 2023-08-12 DIAGNOSIS — M797 Fibromyalgia: Secondary | ICD-10-CM | POA: Diagnosis not present

## 2023-08-12 HISTORY — DX: Abnormal uterine and vaginal bleeding, unspecified: N93.9

## 2023-08-12 NOTE — Progress Notes (Addendum)
Established Patient Office Visit  Subjective:  Patient ID: Cindy Hammond, female    DOB: 03-Oct-1965  Age: 57 y.o. MRN: [id number]  CC:  Chief Complaint  Patient presents with   Medical Management of Chronic Issues    HPI Cindy Hammond presents for follow up hyperlipidemia and chronic issues  Mixed hyperlipidemia  Pt presents with hyperlipidemia.  Compliance with treatment has been good; The patient is compliant with medications, maintains a low cholesterol diet , follows up as directed , . The patient denies experiencing any hypercholesterolemia related symptoms.  She is currently taking lipitor 80mg  qd  Pt presents for follow up of hypertension.  The patient is tolerating the medication well without side effects. Compliance with treatment has been good; including taking medication as directed ,    She is currently on atenolol 25 mg qd and lasix 40mg  qd- she denies chest pain or shortness of breath Pt follows with Dr Servando Salina cardiologist regularly who has her taking Ranexa and use nitrrostat as needed.  She has follow up appt next month  Pt with  GERD - stable on omeprazole 40mg  qd  Pt with  anxiety - states her symptoms controlled well with clonazepam, amitriptyline,seroquel and buspar - voices no problems or concerns - she states she is seeing  a therapist regularly- [name]  Pt currently taking Vit D weekly - due for labwork  Pt with  migraines - currently stable on topamax and ubrelvy- she also takes aimovig - says headaches are manageable at this time - she follows regularly with Neurology - [name] NP  Patient has  prediabetes - states at this time is not really watching diet well but trying- due to repeat hgb a1c  Pt has appt with GYN on 09/08/23 because she has recently had new vaginal bleeding - she states it has stopped for now but pt does need to keep appt for further evaluation of etiology  Past Medical History:  Diagnosis Date   Anxiety    Depression    GERD  (gastroesophageal reflux disease)    Hyperlipidemia    Hypertension    Pain in joint, lower leg    Thrombocytopenia (HCC)     Past Surgical History:  Procedure Laterality Date   TUBAL LIGATION      Family History  Problem Relation Age of Onset   Hyperlipidemia Mother    Hypertension Mother    Hyperlipidemia Father    Hypertension Father     Social History   Socioeconomic History   Marital status: Married    Spouse name: Not on file   Number of children: 3   Years of education: Not on file   Highest education level: Not on file  Occupational History   Occupation: Unemployed  Tobacco Use   Smoking status: Former    Current packs/day: 0.00    Average packs/day: 0.5 packs/day for 10.0 years (5.0 ttl pk-yrs)    Types: Cigarettes    Start date: 35    Quit date: 2000    Years since quitting: 24.9   Smokeless tobacco: Never  Vaping Use   Vaping status: Never Used  Substance and Sexual Activity   Alcohol use: No    Alcohol/week: 0.0 standard drinks of alcohol   Drug use: No   Sexual activity: Yes    Partners: Male  Other Topics Concern   Not on file  Social History Narrative   Not on file   Social Determinants of Corporate investment banker  Strain: Low Risk  (04/07/2023)   Overall Financial Resource Strain (CARDIA)    Difficulty of Paying Living Expenses: Not hard at all  Food Insecurity: No Food Insecurity (04/07/2023)   Hunger Vital Sign    Worried About Running Out of Food in the Last Year: Never true    Ran Out of Food in the Last Year: Never true  Transportation Needs: No Transportation Needs (04/07/2023)   PRAPARE - Administrator, Civil Service (Medical): No    Lack of Transportation (Non-Medical): No  Physical Activity: Inactive (04/07/2023)   Exercise Vital Sign    Days of Exercise per Week: 0 days    Minutes of Exercise per Session: 0 min  Stress: No Stress Concern Present (04/07/2023)   [employer] of Occupational Health - Occupational  Stress Questionnaire    Feeling of Stress : Not at all  Social Connections: Moderately Isolated (04/07/2023)   Social Connection and Isolation Panel [NHANES]    Frequency of Communication with Friends and Family: More than three times a week    Frequency of Social Gatherings with Friends and Family: More than three times a week    Attends Religious Services: Never    Database administrator or Organizations: No    Attends Banker Meetings: Never    Marital Status: Married  Catering manager Violence: Not At Risk (04/07/2023)   Humiliation, Afraid, Rape, and Kick questionnaire    Fear of Current or Ex-Partner: No    Emotionally Abused: No    Physically Abused: No    Sexually Abused: No     Current Outpatient Medications:    albuterol (VENTOLIN HFA) 108 (90 Base) MCG/ACT inhaler, INHALE 1-2 PUFFS INTO THE LUNGS EVERY 6 HOURS AS NEEDED FOR WHEEZING OR SHORTNESS OF BREATH, Disp: 8.5 g, Rfl: 2   amitriptyline (ELAVIL) 150 MG tablet, TAKE 1 TABLET BY MOUTH DAILY AT BEDTIME., Disp: 90 tablet, Rfl: 1   atenolol (TENORMIN) 25 MG tablet, Take 1 tablet (25 mg total) by mouth 2 (two) times daily., Disp: 180 tablet, Rfl: 3   atorvastatin (LIPITOR) 80 MG tablet, Take 1 tablet (80 mg total) by mouth daily., Disp: 90 tablet, Rfl: 1   busPIRone (BUSPAR) 30 MG tablet, Take 1 tablet (30 mg total) by mouth 2 (two) times daily., Disp: 180 tablet, Rfl: 1   ciprofloxacin (CIPRO) 500 MG tablet, Take 1 tablet (500 mg total) by mouth 2 (two) times daily for 10 days., Disp: 20 tablet, Rfl: 0   clonazePAM (KLONOPIN) 0.5 MG tablet, TAKE ONE TABLET BY MOUTH TWICE DAILY, Disp: 60 tablet, Rfl: 1   dicyclomine (BENTYL) 20 MG tablet, TAKE 1 TABLET BY MOUTH THREE(3) TIMES DAILY BEFORE MEALS, Disp: 90 tablet, Rfl: 1   Erenumab-aooe (AIMOVIG, 140 MG DOSE, [place]), Inject into the skin., Disp: , Rfl:    fenofibrate (TRICOR) 48 MG tablet, Take 1 tablet (48 mg total) by mouth daily., Disp: 90 tablet, Rfl: 3   furosemide  (LASIX) 40 MG tablet, TAKE 1 TABLET BY MOUTH ONCE DAILY, Disp: 90 tablet, Rfl: 1   KRILL OIL PO, Take 1 capsule by mouth 2 (two) times daily., Disp: , Rfl:    nitroGLYCERIN (NITROSTAT) 0.4 MG SL tablet, PLACE 1 TABLET UNDER THE TONGUE AS NEEDED FOR CHEST PAIN ( MAY REPEAT EVERY5 MINUTES X 3), Disp: 25 tablet, Rfl: 3   omeprazole (PRILOSEC) 40 MG capsule, Take 1 capsule (40 mg total) by mouth daily., Disp: 90 capsule, Rfl: 1   pregabalin (  LYRICA) 75 MG capsule, Take 1 capsule by mouth 3 times daily., Disp: 270 capsule, Rfl: 0   QUEtiapine (SEROQUEL) 300 MG tablet, Take 1 tablet by mouth once daily. AT BEDTIME, Disp: 90 tablet, Rfl: 1   ranolazine (RANEXA) 500 MG 12 hr tablet, TAKE 1 TABLET BY MOUTH TWICE(2) DAILY, Disp: 60 tablet, Rfl: 11   Semaglutide-Weight Management 0.25 MG/0.5ML SOAJ, Inject 0.25 mg into the skin once a week for 28 days., Disp: 2 mL, Rfl: 0   [START ON 09/01/2023] Semaglutide-Weight Management 0.5 MG/0.5ML SOAJ, Inject 0.5 mg into the skin once a week for 28 days., Disp: 2 mL, Rfl: 0   [START ON 09/30/2023] Semaglutide-Weight Management 1 MG/0.5ML SOAJ, Inject 1 mg into the skin once a week for 28 days., Disp: 2 mL, Rfl: 0   topiramate (TOPAMAX) 100 MG tablet, Take 1 tablet by mouth 3 times daily., Disp: 270 tablet, Rfl: 1   Ubrogepant (UBRELVY) 100 MG TABS, Take 100 mg by mouth 2 (two) times daily as needed (migraine)., Disp: , Rfl:    Vitamin D, Ergocalciferol, 50000 units CAPS, Take 1 capsule by mouth twice weekly as directed., Disp: 10 capsule, Rfl: 2   Allergies  Allergen Reactions   Sumatriptan Hives and Rash   Sulfa Antibiotics    CONSTITUTIONAL: Negative for chills, fatigue, fever, unintentional weight gain and unintentional weight loss.  E/N/T: Negative for ear pain, nasal congestion and sore throat.  CARDIOVASCULAR: Negative for chest pain, dizziness, palpitations and pedal edema.  RESPIRATORY: Negative for recent cough and dyspnea.  GASTROINTESTINAL: Negative  for abdominal pain, acid reflux symptoms, constipation, diarrhea, nausea and vomiting.  GU - see HPI MSK: Negative for arthralgias and myalgias.  INTEGUMENTARY: Negative for rash.  NEUROLOGICAL: Negative for dizziness and headaches.  PSYCHIATRIC: Negative for sleep disturbance and to question depression screen.  Negative for depression, negative for anhedonia.        Objective:  PHYSICAL EXAM:   VS: BP 124/78   Pulse 78   Temp (!) 97.4 F (36.3 C) (Temporal)   Ht 5' (1.524 m)   Wt 242 lb 12.8 oz (110.1 kg)   SpO2 97%   BMI 47.42 kg/m   GEN: Well nourished, well developed, in no acute distress   Cardiac: RRR; no murmurs, rubs, or gallops,no edema -  Respiratory:  normal respiratory rate and pattern with no distress - normal breath sounds with no rales, rhonchi, wheezes or rubs GI: normal bowel sounds, no masses or tenderness MS: no deformity or atrophy  Skin: warm and dry, no rash  Neuro:  Alert and Oriented x 3, - CN II-Xii grossly intact Psych: euthymic mood, appropriate affect and demeanor   No visits with results within 1 Day(s) from this visit.  Latest known visit with results is:  Clinical Support on 08/03/2023  Component Date Value Ref Range Status   Urine Culture, Routine 08/03/2023 Final report   Final   Organism ID, Bacteria 08/03/2023 Comment   Final   Comment: Mixed urogenital flora Less than 10,000 colonies/mL    Color, UA 08/03/2023 red (A)  yellow Final   Clarity, UA 08/03/2023 cloudy (A)  clear Final   Glucose, UA 08/03/2023 negative  negative mg/dL Final   Bilirubin, UA 25/36/6440 small (A)  negative Final   Ketones, POC UA 08/03/2023 negative  negative mg/dL Final   Spec Grav, UA 34/74/2595 >=1.030 (A)  1.010 - 1.025 Final   Blood, UA 08/03/2023 large (A)  negative Final   pH, UA 08/03/2023 5.5  5.0 - 8.0 Final   POC PROTEIN,UA 08/03/2023 trace  negative, trace Final   Urobilinogen, UA 08/03/2023 0.2  0.2 or 1.0 E.U./dL Final   Nitrite, UA  60/45/4098 Negative  Negative Final   Leukocytes, UA 08/03/2023 Small (1+) (A)  Negative Final    Health Maintenance Due  Topic Date Due   Cervical Cancer Screening (HPV/Pap Cotest)  01/11/2023    There are no preventive care reminders to display for this patient.  Lab Results  Component Value Date   TSH 1.670 04/07/2023   Lab Results  Component Value Date   WBC 8.2 05/14/2023   HGB 15.4 05/14/2023   HCT 45.5 05/14/2023   MCV 94 05/14/2023   PLT 293 05/14/2023   Lab Results  Component Value Date   NA 142 04/07/2023   K 4.1 04/07/2023   CO2 21 04/07/2023   GLUCOSE 93 04/07/2023   BUN 9 04/07/2023   CREATININE 0.94 04/07/2023   BILITOT 0.4 04/07/2023   ALKPHOS 200 (H) 04/07/2023   AST 18 04/07/2023   ALT 26 04/07/2023   PROT 7.8 04/07/2023   ALBUMIN 4.4 04/07/2023   CALCIUM 9.5 04/07/2023   ANIONGAP 9 08/25/2021   EGFR 71 04/07/2023   Lab Results  Component Value Date   CHOL 215 (H) 06/25/2023   Lab Results  Component Value Date   HDL 42 06/25/2023   Lab Results  Component Value Date   LDLCALC 127 (H) 06/25/2023   Lab Results  Component Value Date   TRIG 259 (H) 06/25/2023   Lab Results  Component Value Date   CHOLHDL 5.1 (H) 06/25/2023   Lab Results  Component Value Date   HGBA1C 6.0 (H) 04/07/2023      Assessment & Plan:   Problem List Items Addressed This Visit       Cardiovascular and Mediastinum   Benign hypertension - Primary   Relevant Orders   CBC with Differential/Platelet   Comprehensive metabolic panel   TSH Continue meds Follow up with cardiology as directed     Digestive   Gastroesophageal reflux disease without esophagitis Continue meds as directed     Other   Anxiety Continue meds as directed Follow up with therapist    Vitamin D deficiency   Relevant Orders   VITAMIN D 25 Hydroxy (Vit-D Deficiency, Fractures)   Mixed hyperlipidemia   Relevant Orders   Lipid panel Continue meds Watch  diet  Prediabetes Watch diet Hgb a1c pending  CAD Continue current meds and follow up with cardiology as directed  Migraines Continue meds and follow up with neurology as directed  Postmenopausal vaginal bleeding Follow up with GYN as scheduled         Follow-up: Return in about 4 months (around 12/11/2023) for chronic fasting follow-up.    SARA R Arlen Dupuis, PA-C

## 2023-08-13 ENCOUNTER — Other Ambulatory Visit: Payer: Self-pay | Admitting: Physician Assistant

## 2023-08-13 ENCOUNTER — Encounter: Payer: Self-pay | Admitting: Physician Assistant

## 2023-08-13 DIAGNOSIS — N189 Chronic kidney disease, unspecified: Secondary | ICD-10-CM | POA: Insufficient documentation

## 2023-08-13 DIAGNOSIS — R899 Unspecified abnormal finding in specimens from other organs, systems and tissues: Secondary | ICD-10-CM

## 2023-08-13 HISTORY — DX: Chronic kidney disease, unspecified: N18.9

## 2023-08-13 LAB — LIPID PANEL
Chol/HDL Ratio: 3.5 {ratio} (ref 0.0–4.4)
Cholesterol, Total: 184 mg/dL (ref 100–199)
HDL: 52 mg/dL (ref >39)
LDL Chol Calc (NIH): 104 mg/dL — ABNORMAL HIGH (ref 0–99)
Triglycerides: 161 mg/dL — ABNORMAL HIGH (ref 0–149)
VLDL Cholesterol Cal: 28 mg/dL (ref 5–40)

## 2023-08-13 LAB — TSH: TSH: 1.74 u[IU]/mL (ref 0.450–4.500)

## 2023-08-13 LAB — CBC WITH DIFFERENTIAL/PLATELET
Basophils Absolute: 0.1 10*3/uL (ref 0.0–0.2)
Basos: 1 %
EOS (ABSOLUTE): 0.3 10*3/uL (ref 0.0–0.4)
Eos: 3 %
Hematocrit: 44.5 % (ref 34.0–46.6)
Hemoglobin: 14.9 g/dL (ref 11.1–15.9)
Immature Grans (Abs): 0 10*3/uL (ref 0.0–0.1)
Immature Granulocytes: 0 %
Lymphocytes Absolute: 2.2 10*3/uL (ref 0.7–3.1)
Lymphs: 24 %
MCH: 33.1 pg — ABNORMAL HIGH (ref 26.6–33.0)
MCHC: 33.5 g/dL (ref 31.5–35.7)
MCV: 99 fL — ABNORMAL HIGH (ref 79–97)
Monocytes Absolute: 0.7 10*3/uL (ref 0.1–0.9)
Monocytes: 8 %
Neutrophils Absolute: 6 10*3/uL (ref 1.4–7.0)
Neutrophils: 64 %
Platelets: 310 10*3/uL (ref 150–450)
RBC: 4.5 x10E6/uL (ref 3.77–5.28)
RDW: 12.7 % (ref 11.7–15.4)
WBC: 9.3 10*3/uL (ref 3.4–10.8)

## 2023-08-13 LAB — COMPREHENSIVE METABOLIC PANEL
ALT: 32 [IU]/L (ref 0–32)
AST: 18 [IU]/L (ref 0–40)
Albumin: 3.8 g/dL (ref 3.8–4.9)
Alkaline Phosphatase: 143 [IU]/L — ABNORMAL HIGH (ref 44–121)
BUN/Creatinine Ratio: 13 (ref 9–23)
BUN: 16 mg/dL (ref 6–24)
Bilirubin Total: 0.4 mg/dL (ref 0.0–1.2)
CO2: 21 mmol/L (ref 20–29)
Calcium: 9.4 mg/dL (ref 8.7–10.2)
Chloride: 108 mmol/L — ABNORMAL HIGH (ref 96–106)
Creatinine, Ser: 1.21 mg/dL — ABNORMAL HIGH (ref 0.57–1.00)
Globulin, Total: 2.9 g/dL (ref 1.5–4.5)
Glucose: 93 mg/dL (ref 70–99)
Potassium: 4.9 mmol/L (ref 3.5–5.2)
Sodium: 143 mmol/L (ref 134–144)
Total Protein: 6.7 g/dL (ref 6.0–8.5)
eGFR: 53 mL/min/{1.73_m2} — ABNORMAL LOW (ref >59)

## 2023-08-13 LAB — VITAMIN D 25 HYDROXY (VIT D DEFICIENCY, FRACTURES): Vit D, 25-Hydroxy: 59.6 ng/mL (ref 30.0–100.0)

## 2023-08-13 LAB — HEMOGLOBIN A1C
Est. average glucose Bld gHb Est-mCnc: 128 mg/dL
Hgb A1c MFr Bld: 6.1 % — ABNORMAL HIGH (ref 4.8–5.6)

## 2023-08-27 ENCOUNTER — Ambulatory Visit: Payer: Medicaid Other

## 2023-08-27 DIAGNOSIS — R899 Unspecified abnormal finding in specimens from other organs, systems and tissues: Secondary | ICD-10-CM | POA: Diagnosis not present

## 2023-08-28 ENCOUNTER — Other Ambulatory Visit: Payer: Self-pay | Admitting: Physician Assistant

## 2023-08-28 DIAGNOSIS — F419 Anxiety disorder, unspecified: Secondary | ICD-10-CM

## 2023-08-28 LAB — COMPREHENSIVE METABOLIC PANEL
ALT: 26 [IU]/L (ref 0–32)
AST: 19 [IU]/L (ref 0–40)
Albumin: 4.2 g/dL (ref 3.8–4.9)
Alkaline Phosphatase: 156 [IU]/L — ABNORMAL HIGH (ref 44–121)
BUN/Creatinine Ratio: 15 (ref 9–23)
BUN: 16 mg/dL (ref 6–24)
Bilirubin Total: 0.4 mg/dL (ref 0.0–1.2)
CO2: 22 mmol/L (ref 20–29)
Calcium: 9.3 mg/dL (ref 8.7–10.2)
Chloride: 108 mmol/L — ABNORMAL HIGH (ref 96–106)
Creatinine, Ser: 1.04 mg/dL — ABNORMAL HIGH (ref 0.57–1.00)
Globulin, Total: 2.9 g/dL (ref 1.5–4.5)
Glucose: 76 mg/dL (ref 70–99)
Potassium: 4.4 mmol/L (ref 3.5–5.2)
Sodium: 147 mmol/L — ABNORMAL HIGH (ref 134–144)
Total Protein: 7.1 g/dL (ref 6.0–8.5)
eGFR: 63 mL/min/{1.73_m2} (ref >59)

## 2023-09-09 DIAGNOSIS — Z124 Encounter for screening for malignant neoplasm of cervix: Secondary | ICD-10-CM | POA: Diagnosis not present

## 2023-09-09 DIAGNOSIS — N950 Postmenopausal bleeding: Secondary | ICD-10-CM | POA: Diagnosis not present

## 2023-09-09 DIAGNOSIS — R8290 Unspecified abnormal findings in urine: Secondary | ICD-10-CM | POA: Diagnosis not present

## 2023-09-16 ENCOUNTER — Other Ambulatory Visit: Payer: Self-pay

## 2023-09-16 MED ORDER — MELOXICAM 15 MG PO TABS: 15.0000 mg | ORAL_TABLET | Freq: Every day | ORAL | 2 refills | Status: DC

## 2023-09-17 ENCOUNTER — Encounter: Payer: Self-pay | Admitting: Pharmacist Clinician (PhC)/ Clinical Pharmacy Specialist

## 2023-09-29 ENCOUNTER — Other Ambulatory Visit: Payer: Self-pay | Admitting: Cardiology

## 2023-09-29 ENCOUNTER — Other Ambulatory Visit: Payer: Self-pay | Admitting: Physician Assistant

## 2023-09-29 DIAGNOSIS — K219 Gastro-esophageal reflux disease without esophagitis: Secondary | ICD-10-CM

## 2023-09-29 DIAGNOSIS — F419 Anxiety disorder, unspecified: Secondary | ICD-10-CM

## 2023-09-30 ENCOUNTER — Other Ambulatory Visit: Payer: Self-pay | Admitting: Physician Assistant

## 2023-09-30 ENCOUNTER — Other Ambulatory Visit: Payer: Self-pay | Admitting: Cardiology

## 2023-10-03 ENCOUNTER — Other Ambulatory Visit: Payer: Self-pay | Admitting: Cardiology

## 2023-10-06 DIAGNOSIS — R5383 Other fatigue: Secondary | ICD-10-CM | POA: Diagnosis not present

## 2023-10-06 DIAGNOSIS — J4540 Moderate persistent asthma, uncomplicated: Secondary | ICD-10-CM | POA: Diagnosis not present

## 2023-10-06 DIAGNOSIS — F411 Generalized anxiety disorder: Secondary | ICD-10-CM | POA: Diagnosis not present

## 2023-10-06 DIAGNOSIS — G4736 Sleep related hypoventilation in conditions classified elsewhere: Secondary | ICD-10-CM | POA: Diagnosis not present

## 2023-10-13 DIAGNOSIS — J4540 Moderate persistent asthma, uncomplicated: Secondary | ICD-10-CM | POA: Diagnosis not present

## 2023-10-23 ENCOUNTER — Encounter: Payer: Self-pay | Admitting: Physician Assistant

## 2023-10-23 ENCOUNTER — Ambulatory Visit: Payer: Medicaid Other | Admitting: Physician Assistant

## 2023-10-23 VITALS — BP 112/80 | HR 80 | Temp 97.5°F | Resp 18 | Wt 243.8 lb

## 2023-10-23 DIAGNOSIS — R233 Spontaneous ecchymoses: Secondary | ICD-10-CM

## 2023-10-23 NOTE — Progress Notes (Signed)
 Acute Office Visit  Subjective:    Patient ID: Cindy Hammond, female    DOB: 01-30-1966, 58 y.o.   MRN: [id number]  Chief Complaint  Patient presents with   Rash    Right Leg    HPI: Patient is in today for complaints of rash on right lower leg - says she noticed it last night.  Does not itch or burn.  Cannot recall any specific injury or trauma - Has a picture of leg from last night which looked worse yesterday and now today has minimal rash Slightly red today  Denies any other symptoms No tick exposure   Current Outpatient Medications:    albuterol (VENTOLIN HFA) 108 (90 Base) MCG/ACT inhaler, INHALE 1-2 PUFFS INTO THE LUNGS EVERY 6 HOURS AS NEEDED FOR WHEEZING OR SHORTNESS OF BREATH, Disp: 8.5 g, Rfl: 2   amitriptyline (ELAVIL) 150 MG tablet, TAKE 1 TABLET BY MOUTH DAILY AT BEDTIME., Disp: 90 tablet, Rfl: 1   atenolol (TENORMIN) 25 MG tablet, Take 1 tablet (25 mg total) by mouth 2 (two) times daily., Disp: 180 tablet, Rfl: 2   atorvastatin (LIPITOR) 80 MG tablet, Take 1 tablet by mouth ONCE daily., Disp: 90 tablet, Rfl: 1   busPIRone (BUSPAR) 30 MG tablet, Take 1 tablet (30 mg total) by mouth 2 (two) times daily., Disp: 180 tablet, Rfl: 1   clonazePAM (KLONOPIN) 0.5 MG tablet, TAKE ONE TABLET BY MOUTH TWICE DAILY, Disp: 60 tablet, Rfl: 1   dicyclomine (BENTYL) 20 MG tablet, TAKE 1 TABLET BY MOUTH THREE(3) TIMES DAILY BEFORE MEALS, Disp: 90 tablet, Rfl: 1   Erenumab-aooe (AIMOVIG, 140 MG DOSE, ), Inject into the skin., Disp: , Rfl:    fenofibrate (TRICOR) 48 MG tablet, Take 1 tablet (48 mg total) by mouth daily., Disp: 90 tablet, Rfl: 3   furosemide (LASIX) 40 MG tablet, TAKE 1 TABLET BY MOUTH ONCE DAILY, Disp: 90 tablet, Rfl: 1   KRILL OIL PO, Take 1 capsule by mouth 2 (two) times daily., Disp: , Rfl:    meloxicam (MOBIC) 15 MG tablet, Take 1 tablet (15 mg total) by mouth daily., Disp: 30 tablet, Rfl: 2   nitroGLYCERIN (NITROSTAT) 0.4 MG SL tablet, PLACE 1 TABLET UNDER THE TONGUE  AS NEEDED FOR CHEST PAIN ( MAY REPEAT EVERY5 MINUTES X 3), Disp: 25 tablet, Rfl: 3   omeprazole (PRILOSEC) 40 MG capsule, Take 1 capsule by mouth once daily., Disp: 90 capsule, Rfl: 1   pregabalin (LYRICA) 75 MG capsule, Take 1 capsule by mouth 3 times daily., Disp: 270 capsule, Rfl: 0   QUEtiapine (SEROQUEL) 300 MG tablet, Take 1 tablet by mouth once daily. AT BEDTIME, Disp: 90 tablet, Rfl: 1   ranolazine (RANEXA) 500 MG 12 hr tablet, Take 1 tablet by mouth twice daily., Disp: 60 tablet, Rfl: 11   Semaglutide-Weight Management (WEGOVY) 0.25 MG/0.5ML SOAJ, INJECT 0.25 MG INTO THE SKIN ONCE A WEEK FOR 28 DAYS, Disp: 2 mL, Rfl: 0   Semaglutide-Weight Management (WEGOVY) 0.5 MG/0.5ML SOAJ, INJECT 0.5 MG INTO THE SKIN ONCE A WEEK FOR 28 DAYS., Disp: 2 mL, Rfl: 0   topiramate (TOPAMAX) 100 MG tablet, Take 1 tablet by mouth 3 times daily., Disp: 270 tablet, Rfl: 0   Ubrogepant (UBRELVY) 100 MG TABS, Take 100 mg by mouth 2 (two) times daily as needed (migraine)., Disp: , Rfl:    Vitamin D, Ergocalciferol, 50000 units CAPS, Take 1 capsule by mouth twice weekly as directed., Disp: 10 capsule, Rfl: 2  Allergies  Allergen  Reactions   Sumatriptan Hives and Rash   Sulfa Antibiotics     ROS CONSTITUTIONAL: Negative for chills, fatigue, fever, E/N/T: Negative for ear pain, nasal congestion and sore throat.  CARDIOVASCULAR: Negative for chest pain, dizziness, palpitations and pedal edema.  RESPIRATORY: Negative for recent cough and dyspnea.   MSK: Negative for arthralgias and myalgias.  INTEGUMENTARY:see HPI      Objective:    PHYSICAL EXAM:   BP 112/80 (BP Location: Left Arm, Patient Position: Sitting, Cuff Size: Large)   Pulse 80   Temp (!) 97.5 F (36.4 C) (Temporal)   Resp 18   Wt 243 lb 12.8 oz (110.6 kg)   LMP  (LMP Unknown)   SpO2 95%   BMI 47.61 kg/m    GEN: Well nourished, well developed, in no acute distress  Cardiac: RRR; no murmurs, Respiratory:  normal respiratory rate and  pattern with no distress - normal breath sounds with no rales, rhonchi, wheezes or rubs Skin: few faded areas of right lower leg - blanches with pressure      Assessment & Plan:   Petechiae -     CBC with Differential/Platelet Follow up if symptoms change or worsen    Follow-up: Return for as scheduled for chronic visit.  An After Visit Summary was printed and given to the patient.  Jettie Pagan [facility] [phone]

## 2023-10-24 LAB — CBC WITH DIFFERENTIAL/PLATELET
Basophils Absolute: 0.1 10*3/uL (ref 0.0–0.2)
Basos: 1 %
EOS (ABSOLUTE): 0.4 10*3/uL (ref 0.0–0.4)
Eos: 5 %
Hematocrit: 44 % (ref 34.0–46.6)
Hemoglobin: 15.2 g/dL (ref 11.1–15.9)
Immature Grans (Abs): 0.1 10*3/uL (ref 0.0–0.1)
Immature Granulocytes: 1 %
Lymphocytes Absolute: 2 10*3/uL (ref 0.7–3.1)
Lymphs: 22 %
MCH: 32.1 pg (ref 26.6–33.0)
MCHC: 34.5 g/dL (ref 31.5–35.7)
MCV: 93 fL (ref 79–97)
Monocytes Absolute: 0.8 10*3/uL (ref 0.1–0.9)
Monocytes: 9 %
Neutrophils Absolute: 5.6 10*3/uL (ref 1.4–7.0)
Neutrophils: 62 %
Platelets: 305 10*3/uL (ref 150–450)
RBC: 4.73 x10E6/uL (ref 3.77–5.28)
RDW: 12 % (ref 11.7–15.4)
WBC: 9 10*3/uL (ref 3.4–10.8)

## 2023-10-26 ENCOUNTER — Other Ambulatory Visit: Payer: Self-pay | Admitting: Physician Assistant

## 2023-10-26 ENCOUNTER — Encounter: Payer: Self-pay | Admitting: Physician Assistant

## 2023-10-26 DIAGNOSIS — E559 Vitamin D deficiency, unspecified: Secondary | ICD-10-CM

## 2023-10-28 ENCOUNTER — Other Ambulatory Visit: Payer: Self-pay | Admitting: Cardiology

## 2023-10-29 ENCOUNTER — Other Ambulatory Visit: Payer: Self-pay | Admitting: Physician Assistant

## 2023-10-29 DIAGNOSIS — F419 Anxiety disorder, unspecified: Secondary | ICD-10-CM

## 2023-10-29 MED ORDER — WEGOVY 1 MG/0.5ML ~~LOC~~ SOAJ: 1.0000 mg | SUBCUTANEOUS | 0 refills | Status: DC

## 2023-11-05 DIAGNOSIS — G4733 Obstructive sleep apnea (adult) (pediatric): Secondary | ICD-10-CM | POA: Diagnosis not present

## 2023-11-05 DIAGNOSIS — R5383 Other fatigue: Secondary | ICD-10-CM | POA: Diagnosis not present

## 2023-11-09 ENCOUNTER — Ambulatory Visit: Admitting: Physician Assistant

## 2023-11-09 ENCOUNTER — Encounter: Payer: Self-pay | Admitting: Physician Assistant

## 2023-11-09 ENCOUNTER — Ambulatory Visit: Payer: Self-pay | Admitting: Physician Assistant

## 2023-11-09 VITALS — BP 110/84 | HR 72 | Temp 97.5°F | Resp 18 | Ht 60.0 in | Wt 240.0 lb

## 2023-11-09 DIAGNOSIS — N3001 Acute cystitis with hematuria: Secondary | ICD-10-CM

## 2023-11-09 LAB — POCT URINALYSIS DIP (CLINITEK)
Bilirubin, UA: NEGATIVE
Glucose, UA: NEGATIVE mg/dL
Ketones, POC UA: NEGATIVE mg/dL
Nitrite, UA: NEGATIVE
POC PROTEIN,UA: NEGATIVE
Spec Grav, UA: 1.01 (ref 1.010–1.025)
Urobilinogen, UA: 0.2 U/dL
pH, UA: 6 (ref 5.0–8.0)

## 2023-11-09 MED ORDER — NITROFURANTOIN MONOHYD MACRO 100 MG PO CAPS: 100.0000 mg | ORAL_CAPSULE | Freq: Two times a day (BID) | ORAL | 0 refills | Status: DC

## 2023-11-09 NOTE — Telephone Encounter (Signed)
  Chief Complaint: blood in urine Symptoms: tea-colored urine, mild pain with urination, urinary retention Frequency: since yesterday Pertinent Negatives: Patient denies blood clots in urine, fever, lower back pain, flank pain, nausea, vomiting, urinary frequency, muscle pain, recent injuries. Disposition: [] ED /[] Urgent Care (no appt availability in office) / [x] Appointment(In office/virtual)/ []  [facility]/ [] Home Care/ [] Refused Recommended Disposition /[]  Mobile Bus/ []  Follow-up with PCP Additional Notes: Patient states she was drinking tea yesterday and noticed her urine was tea-colored so she didn't think much of it. Today she states she urinated once and noticed it was tea-colored again so she turned on the bathroom light and states she realized it was blood. Patient agreeable to acute office visit today with PCP.  Copied from CRM [id number]. Topic: Clinical - Red Word Triage >> Nov 09, 2023  9:55 AM Alessandra Bevels wrote: Red Word that prompted transfer to Nurse Triage: Patient is calling to report blood in her urine and mild abdominal pain. Please advise Reason for Disposition  Pain or burning with passing urine  Answer Assessment - Initial Assessment Questions 1. COLOR of URINE: "Describe the color of the urine."  (e.g., tea-colored, pink, red, bloody) "Do you have blood clots in your urine?" (e.g., none, pea, grape, small coin)     Tea-colored, really dark.  2. ONSET: "When did the bleeding start?"      Patient states she thinks she might have had it yesterday, but states it was really dark this morning and states she could tell its blood.  3. EPISODES: "How many times has there been blood in the urine?" or "How many times today?"     1 time today and states yesterday it was dark once(states she didn't pay much attention to it because she was drinking tea).  4. PAIN with URINATION: "Is there any pain with passing your urine?" If Yes, ask: "How bad is the pain?"   (Scale 1-10; or mild, moderate, severe)    - MILD: Complains slightly about urination hurting.    - MODERATE: Interferes with normal activities.      - SEVERE: Excruciating, unwilling or unable to urinate because of the pain.      Mild, soreness.  5. FEVER: "Do you have a fever?" If Yes, ask: "What is your temperature, how was it measured, and when did it start?"     Denies.  6. ASSOCIATED SYMPTOMS: "Are you passing urine more frequently than usual?"     Denies.  7. OTHER SYMPTOMS: "Do you have any other symptoms?" (e.g., back/flank pain, abdomen pain, vomiting)     Urinary retention (states she when she was urinating it was coming out and then she had to wait and more would come, then again a pause and more would come out. States it felt like it was drawn out).  Protocols used: Urine - Blood In-A-AH

## 2023-11-09 NOTE — Progress Notes (Signed)
 Acute Office Visit  Subjective:    Patient ID: Cindy Hammond, female    DOB: 1966-07-06, 58 y.o.   MRN: [id number]  Chief Complaint  Patient presents with   Dysuria    HPI: Patient is in today for complaints of dysuria and noted gross hematuria this morning.  Denies fever, malaise.  Denies back pain.  Pt has not had kidney stones in the past.  Denies abdominal pain   Current Outpatient Medications:    albuterol (VENTOLIN HFA) 108 (90 Base) MCG/ACT inhaler, INHALE 1-2 PUFFS INTO THE LUNGS EVERY 6 HOURS AS NEEDED FOR WHEEZING OR SHORTNESS OF BREATH, Disp: 8.5 g, Rfl: 2   amitriptyline (ELAVIL) 150 MG tablet, TAKE 1 TABLET BY MOUTH DAILY AT BEDTIME., Disp: 90 tablet, Rfl: 1   atenolol (TENORMIN) 25 MG tablet, Take 1 tablet (25 mg total) by mouth 2 (two) times daily., Disp: 180 tablet, Rfl: 2   atorvastatin (LIPITOR) 80 MG tablet, Take 1 tablet by mouth ONCE daily., Disp: 90 tablet, Rfl: 1   busPIRone (BUSPAR) 30 MG tablet, Take 1 tablet (30 mg total) by mouth 2 (two) times daily., Disp: 180 tablet, Rfl: 1   clonazePAM (KLONOPIN) 0.5 MG tablet, TAKE ONE TABLET BY MOUTH TWICE DAILY, Disp: 60 tablet, Rfl: 1   dicyclomine (BENTYL) 20 MG tablet, TAKE 1 TABLET BY MOUTH THREE(3) TIMES DAILY BEFORE MEALS, Disp: 90 tablet, Rfl: 1   Erenumab-aooe (AIMOVIG, 140 MG DOSE, [place]), Inject into the skin., Disp: , Rfl:    fenofibrate (TRICOR) 48 MG tablet, Take 1 tablet (48 mg total) by mouth daily., Disp: 90 tablet, Rfl: 3   furosemide (LASIX) 40 MG tablet, TAKE 1 TABLET BY MOUTH ONCE DAILY, Disp: 90 tablet, Rfl: 1   KRILL OIL PO, Take 1 capsule by mouth 2 (two) times daily., Disp: , Rfl:    meloxicam (MOBIC) 15 MG tablet, Take 1 tablet (15 mg total) by mouth daily., Disp: 30 tablet, Rfl: 2   montelukast (SINGULAIR) 10 MG tablet, Take 10 mg by mouth daily., Disp: , Rfl:    nitrofurantoin, macrocrystal-monohydrate, (MACROBID) 100 MG capsule, Take 1 capsule (100 mg total) by mouth 2 (two) times daily., Disp: 20  capsule, Rfl: 0   nitroGLYCERIN (NITROSTAT) 0.4 MG SL tablet, PLACE 1 TABLET UNDER THE TONGUE AS NEEDED FOR CHEST PAIN ( MAY REPEAT EVERY5 MINUTES X 3), Disp: 25 tablet, Rfl: 3   omeprazole (PRILOSEC) 40 MG capsule, Take 1 capsule by mouth once daily., Disp: 90 capsule, Rfl: 1   pregabalin (LYRICA) 75 MG capsule, Take 1 capsule by mouth 3 times daily., Disp: 270 capsule, Rfl: 0   QUEtiapine (SEROQUEL) 300 MG tablet, Take 1 tablet by mouth once daily. AT BEDTIME, Disp: 90 tablet, Rfl: 1   ranolazine (RANEXA) 500 MG 12 hr tablet, Take 1 tablet by mouth twice daily., Disp: 60 tablet, Rfl: 11   Semaglutide-Weight Management (WEGOVY) 1 MG/0.5ML SOAJ, Inject 1 mg into the skin once a week., Disp: 2 mL, Rfl: 0   topiramate (TOPAMAX) 100 MG tablet, Take 1 tablet by mouth 3 times daily., Disp: 270 tablet, Rfl: 0   Ubrogepant (UBRELVY) 100 MG TABS, Take 100 mg by mouth 2 (two) times daily as needed (migraine)., Disp: , Rfl:    Vitamin D, Ergocalciferol, (DRISDOL) 1.25 MG (50000 UNIT) CAPS capsule, Take 1 capsule by mouth twice weekly as directed., Disp: 10 capsule, Rfl: 1  Allergies  Allergen Reactions   Sumatriptan Hives and Rash   Sulfa Antibiotics  ROS CONSTITUTIONAL: Negative for chills, fatigue, fever,  CARDIOVASCULAR: Negative for chest pain, dizziness, RESPIRATORY: Negative for recent cough and dyspnea.  GASTROINTESTINAL: Negative for abdominal pain, acid reflux symptoms, constipation, diarrhea, nausea and vomiting.  GU - see HPI     Objective:    PHYSICAL EXAM:   BP 110/84 (BP Location: Left Arm, Patient Position: Sitting, Cuff Size: Large)   Pulse 72   Temp (!) 97.5 F (36.4 C) (Temporal)   Resp 18   Ht 5' (1.524 m)   Wt 240 lb (108.9 kg)   LMP  (LMP Unknown)   SpO2 96%   BMI 46.87 kg/m    GEN: Well nourished, well developed, in no acute distress  Cardiac: RRR; no murmurs,  Respiratory:  normal respiratory rate and pattern with no distress - normal breath sounds with no  rales, rhonchi, wheezes or rubs GI: normal bowel sounds, no masses or tenderness  Office Visit on 11/09/2023  Component Date Value Ref Range Status   Color, UA 11/09/2023 colorless (A)  yellow Final   Clarity, UA 11/09/2023 cloudy (A)  clear Final   Glucose, UA 11/09/2023 negative  negative mg/dL Final   Bilirubin, UA 40/98/1191 negative  negative Final   Ketones, POC UA 11/09/2023 negative  negative mg/dL Final   Spec Grav, UA 47/82/9562 1.010  1.010 - 1.025 Final   Blood, UA 11/09/2023 large (A)  negative Final   pH, UA 11/09/2023 6.0  5.0 - 8.0 Final   POC PROTEIN,UA 11/09/2023 negative  negative, trace Final   Urobilinogen, UA 11/09/2023 0.2  0.2 or 1.0 E.U./dL Final   Nitrite, UA 13/04/6577 Negative  Negative Final   Leukocytes, UA 11/09/2023 Small (1+) (A)  Negative Final        Assessment & Plan:    Acute hemorrhagic cystitis -     POCT URINALYSIS DIP (CLINITEK) -     Urine Culture -     Nitrofurantoin Monohyd Macro; Take 1 capsule (100 mg total) by mouth 2 (two) times daily.  Dispense: 20 capsule; Refill: 0     Follow-up: Return in about 3 weeks (around 11/30/2023) for nurse visit re-check UA.  An After Visit Summary was printed and given to the patient.  Jettie Pagan [facility] [phone]

## 2023-11-10 DIAGNOSIS — J4540 Moderate persistent asthma, uncomplicated: Secondary | ICD-10-CM | POA: Diagnosis not present

## 2023-11-10 LAB — URINE CULTURE

## 2023-11-11 ENCOUNTER — Encounter: Payer: Self-pay | Admitting: Physician Assistant

## 2023-11-20 DIAGNOSIS — M47812 Spondylosis without myelopathy or radiculopathy, cervical region: Secondary | ICD-10-CM | POA: Diagnosis not present

## 2023-11-20 DIAGNOSIS — M1990 Unspecified osteoarthritis, unspecified site: Secondary | ICD-10-CM | POA: Insufficient documentation

## 2023-11-20 DIAGNOSIS — M542 Cervicalgia: Secondary | ICD-10-CM | POA: Diagnosis not present

## 2023-11-20 DIAGNOSIS — G43009 Migraine without aura, not intractable, without status migrainosus: Secondary | ICD-10-CM | POA: Diagnosis not present

## 2023-11-20 HISTORY — DX: Unspecified osteoarthritis, unspecified site: M19.90

## 2023-11-22 ENCOUNTER — Encounter: Payer: Self-pay | Admitting: Pharmacist Clinician (PhC)/ Clinical Pharmacy Specialist

## 2023-11-23 ENCOUNTER — Other Ambulatory Visit: Payer: Self-pay | Admitting: Physician Assistant

## 2023-11-23 DIAGNOSIS — F419 Anxiety disorder, unspecified: Secondary | ICD-10-CM

## 2023-11-30 ENCOUNTER — Ambulatory Visit

## 2023-11-30 NOTE — Progress Notes (Unsigned)
 Patient is in office today for a nurse visit for Repeat UA. Patient UA was  {Findings; lab ua dip:[id number]}

## 2023-12-02 DIAGNOSIS — R5381 Other malaise: Secondary | ICD-10-CM | POA: Diagnosis not present

## 2023-12-02 DIAGNOSIS — R0602 Shortness of breath: Secondary | ICD-10-CM | POA: Diagnosis not present

## 2023-12-02 DIAGNOSIS — R002 Palpitations: Secondary | ICD-10-CM | POA: Diagnosis not present

## 2023-12-02 DIAGNOSIS — R9431 Abnormal electrocardiogram [ECG] [EKG]: Secondary | ICD-10-CM | POA: Diagnosis not present

## 2023-12-02 DIAGNOSIS — I509 Heart failure, unspecified: Secondary | ICD-10-CM | POA: Diagnosis not present

## 2023-12-02 DIAGNOSIS — R0789 Other chest pain: Secondary | ICD-10-CM | POA: Diagnosis not present

## 2023-12-02 DIAGNOSIS — R5383 Other fatigue: Secondary | ICD-10-CM | POA: Diagnosis not present

## 2023-12-03 DIAGNOSIS — R002 Palpitations: Secondary | ICD-10-CM | POA: Diagnosis not present

## 2023-12-03 DIAGNOSIS — I2510 Atherosclerotic heart disease of native coronary artery without angina pectoris: Secondary | ICD-10-CM | POA: Diagnosis not present

## 2023-12-03 DIAGNOSIS — R0789 Other chest pain: Secondary | ICD-10-CM | POA: Diagnosis not present

## 2023-12-11 DIAGNOSIS — J4540 Moderate persistent asthma, uncomplicated: Secondary | ICD-10-CM | POA: Diagnosis not present

## 2023-12-11 MED ORDER — WEGOVY 0.5 MG/0.5ML ~~LOC~~ SOAJ: 0.5000 mg | SUBCUTANEOUS | 0 refills | Status: DC

## 2023-12-15 ENCOUNTER — Ambulatory Visit: Payer: Medicaid Other | Admitting: Physician Assistant

## 2023-12-15 ENCOUNTER — Encounter: Payer: Self-pay | Admitting: Physician Assistant

## 2023-12-15 VITALS — BP 118/88 | HR 85 | Temp 97.8°F | Ht 60.0 in | Wt 233.0 lb

## 2023-12-15 DIAGNOSIS — K219 Gastro-esophageal reflux disease without esophagitis: Secondary | ICD-10-CM | POA: Diagnosis not present

## 2023-12-15 DIAGNOSIS — R7303 Prediabetes: Secondary | ICD-10-CM

## 2023-12-15 DIAGNOSIS — M797 Fibromyalgia: Secondary | ICD-10-CM

## 2023-12-15 DIAGNOSIS — E782 Mixed hyperlipidemia: Secondary | ICD-10-CM | POA: Diagnosis not present

## 2023-12-15 DIAGNOSIS — I10 Essential (primary) hypertension: Secondary | ICD-10-CM

## 2023-12-15 DIAGNOSIS — F419 Anxiety disorder, unspecified: Secondary | ICD-10-CM

## 2023-12-15 DIAGNOSIS — E559 Vitamin D deficiency, unspecified: Secondary | ICD-10-CM | POA: Diagnosis not present

## 2023-12-15 NOTE — Progress Notes (Signed)
 Established Patient Office Visit  Subjective:  Patient ID: Cindy Hammond, female    DOB: 04/11/1966  Age: 58 y.o. MRN: [id number]  CC:  Chief Complaint  Patient presents with   Medical Management of Chronic Issues    HPI Cindy Hammond presents for follow up hyperlipidemia and chronic issues  Mixed hyperlipidemia  Pt presents with hyperlipidemia.  Compliance with treatment has been good; The patient is compliant with medications, maintains a low cholesterol diet , follows up as directed , . The patient denies experiencing any hypercholesterolemia related symptoms.  She is currently taking lipitor 80mg  qd and tricor  Pt presents for follow up of hypertension.  The patient is tolerating the medication well without side effects. Compliance with treatment has been good; including taking medication as directed ,    She is currently on atenolol 25 mg qd and lasix 40mg  qd- she denies chest pain or shortness of breath today but was in the hospital a few weeks ago for chest pain - had a stress test which was normal and has follow up appt with Dr Servando Salina on 01/05/24.   She is  taking Ranexa and use nitrrostat as needed.    Pt with  GERD - stable on omeprazole 40mg  qd  Pt with  anxiety - states her symptoms controlled well with clonazepam, amitriptyline,seroquel and buspar - voices no problems or concerns - she states she is seeing  a therapist regularly- [name]  Pt currently taking Vit D weekly - due for labwork  Pt with  migraines - currently stable on topamax and ubrelvy- - she follows regularly with Neurology - [name] NP  Patient has  prediabetes - states at this time is not really watching diet well but trying- due to repeat hgb a1c    Past Medical History:  Diagnosis Date   Anxiety    Depression    GERD (gastroesophageal reflux disease)    Hyperlipidemia    Hypertension    Pain in joint, lower leg    Thrombocytopenia (HCC)     Past Surgical History:  Procedure Laterality Date    TUBAL LIGATION      Family History  Problem Relation Age of Onset   Hyperlipidemia Mother    Hypertension Mother    Hyperlipidemia Father    Hypertension Father     Social History   Socioeconomic History   Marital status: Married    Spouse name: Not on file   Number of children: 3   Years of education: Not on file   Highest education level: Not on file  Occupational History   Occupation: Unemployed  Tobacco Use   Smoking status: Former    Current packs/day: 0.00    Average packs/day: 0.5 packs/day for 10.0 years (5.0 ttl pk-yrs)    Types: Cigarettes    Start date: 31    Quit date: 2000    Years since quitting: 25.3   Smokeless tobacco: Never  Vaping Use   Vaping status: Never Used  Substance and Sexual Activity   Alcohol use: No    Alcohol/week: 0.0 standard drinks of alcohol   Drug use: No   Sexual activity: Yes    Partners: Male  Other Topics Concern   Not on file  Social History Narrative   Not on file   Social Drivers of Health   Financial Resource Strain: Low Risk  (04/07/2023)   Overall Financial Resource Strain (CARDIA)    Difficulty of Paying Living Expenses: Not hard at all  Food Insecurity: No Food Insecurity (04/07/2023)   Hunger Vital Sign    Worried About Running Out of Food in the Last Year: Never true    Ran Out of Food in the Last Year: Never true  Transportation Needs: No Transportation Needs (04/07/2023)   PRAPARE - Administrator, Civil Service (Medical): No    Lack of Transportation (Non-Medical): No  Physical Activity: Inactive (04/07/2023)   Exercise Vital Sign    Days of Exercise per Week: 0 days    Minutes of Exercise per Session: 0 min  Stress: No Stress Concern Present (04/07/2023)   [employer] of Occupational Health - Occupational Stress Questionnaire    Feeling of Stress : Not at all  Social Connections: Moderately Isolated (04/07/2023)   Social Connection and Isolation Panel [NHANES]    Frequency of Communication  with Friends and Family: More than three times a week    Frequency of Social Gatherings with Friends and Family: More than three times a week    Attends Religious Services: Never    Database administrator or Organizations: No    Attends Banker Meetings: Never    Marital Status: Married  Catering manager Violence: Not At Risk (04/07/2023)   Humiliation, Afraid, Rape, and Kick questionnaire    Fear of Current or Ex-Partner: No    Emotionally Abused: No    Physically Abused: No    Sexually Abused: No     Current Outpatient Medications:    albuterol (VENTOLIN HFA) 108 (90 Base) MCG/ACT inhaler, INHALE 1-2 PUFFS INTO THE LUNGS EVERY 6 HOURS AS NEEDED FOR WHEEZING OR SHORTNESS OF BREATH, Disp: 8.5 g, Rfl: 2   amitriptyline (ELAVIL) 150 MG tablet, TAKE 1 TABLET BY MOUTH DAILY AT BEDTIME., Disp: 90 tablet, Rfl: 0   atenolol (TENORMIN) 25 MG tablet, Take 1 tablet (25 mg total) by mouth 2 (two) times daily., Disp: 180 tablet, Rfl: 2   atorvastatin (LIPITOR) 80 MG tablet, Take 1 tablet by mouth ONCE daily., Disp: 90 tablet, Rfl: 1   busPIRone (BUSPAR) 30 MG tablet, Take 1 tablet (30 mg total) by mouth 2 (two) times daily., Disp: 180 tablet, Rfl: 1   clonazePAM (KLONOPIN) 0.5 MG tablet, TAKE ONE TABLET BY MOUTH TWICE DAILY, Disp: 60 tablet, Rfl: 1   fenofibrate (TRICOR) 48 MG tablet, Take 1 tablet (48 mg total) by mouth daily., Disp: 90 tablet, Rfl: 3   furosemide (LASIX) 40 MG tablet, TAKE 1 TABLET BY MOUTH ONCE DAILY, Disp: 90 tablet, Rfl: 1   KRILL OIL PO, Take 1 capsule by mouth 2 (two) times daily., Disp: , Rfl:    meloxicam (MOBIC) 15 MG tablet, Take 1 tablet (15 mg total) by mouth daily., Disp: 30 tablet, Rfl: 2   montelukast (SINGULAIR) 10 MG tablet, Take 10 mg by mouth daily., Disp: , Rfl:    nitroGLYCERIN (NITROSTAT) 0.4 MG SL tablet, PLACE 1 TABLET UNDER THE TONGUE AS NEEDED FOR CHEST PAIN ( MAY REPEAT EVERY5 MINUTES X 3), Disp: 25 tablet, Rfl: 3   omeprazole (PRILOSEC) 40 MG  capsule, Take 1 capsule by mouth once daily., Disp: 90 capsule, Rfl: 1   pregabalin (LYRICA) 75 MG capsule, Take 1 capsule by mouth 3 times daily., Disp: 270 capsule, Rfl: 0   QUEtiapine (SEROQUEL) 300 MG tablet, Take 1 tablet by mouth once daily. AT BEDTIME, Disp: 90 tablet, Rfl: 1   ranolazine (RANEXA) 500 MG 12 hr tablet, Take 1 tablet by mouth twice daily., Disp: 60  tablet, Rfl: 11   topiramate (TOPAMAX) 100 MG tablet, Take 1 tablet by mouth 3 times daily., Disp: 270 tablet, Rfl: 0   Ubrogepant (UBRELVY) 100 MG TABS, Take 100 mg by mouth 2 (two) times daily as needed (migraine)., Disp: , Rfl:    Vitamin D, Ergocalciferol, (DRISDOL) 1.25 MG (50000 UNIT) CAPS capsule, Take 1 capsule by mouth twice weekly as directed., Disp: 10 capsule, Rfl: 1   Allergies  Allergen Reactions   Sumatriptan Hives and Rash   Sulfa Antibiotics    CONSTITUTIONAL: Negative for chills, fatigue, fever, unintentional weight gain and unintentional weight loss.  E/N/T: Negative for ear pain, nasal congestion and sore throat.  CARDIOVASCULAR: Negative for chest pain, dizziness, palpitations and pedal edema.  RESPIRATORY: Negative for recent cough and dyspnea.  GASTROINTESTINAL: Negative for abdominal pain, acid reflux symptoms, constipation, diarrhea, nausea and vomiting.  MSK: Negative for arthralgias and myalgias.  INTEGUMENTARY: Negative for rash.  NEUROLOGICAL: Negative for dizziness and headaches.  PSYCHIATRIC: Negative for sleep disturbance and to question depression screen.  Negative for depression, negative for anhedonia.        Objective:  PHYSICAL EXAM:   VS: BP 118/88   Pulse 85   Temp 97.8 F (36.6 C)   Ht 5' (1.524 m)   Wt 233 lb (105.7 kg)   SpO2 96%   BMI 45.50 kg/m   GEN: Well nourished, well developed, in no acute distress   Cardiac: RRR; no murmurs, rubs, or gallops,no edema - Respiratory:  normal respiratory rate and pattern with no distress - normal breath sounds with no rales,  rhonchi, wheezes or rubs  MS: no deformity or atrophy  Skin: warm and dry, no rash  Neuro:  Alert and Oriented x 3,- CN II-Xii grossly intact Psych: euthymic mood, appropriate affect and demeanor   No visits with results within 1 Day(s) from this visit.  Latest known visit with results is:  Office Visit on 11/09/2023  Component Date Value Ref Range Status   Color, UA 11/09/2023 colorless (A)  yellow Final   Clarity, UA 11/09/2023 cloudy (A)  clear Final   Glucose, UA 11/09/2023 negative  negative mg/dL Final   Bilirubin, UA 16/06/9603 negative  negative Final   Ketones, POC UA 11/09/2023 negative  negative mg/dL Final   Spec Grav, UA 54/05/8118 1.010  1.010 - 1.025 Final   Blood, UA 11/09/2023 large (A)  negative Final   pH, UA 11/09/2023 6.0  5.0 - 8.0 Final   POC PROTEIN,UA 11/09/2023 negative  negative, trace Final   Urobilinogen, UA 11/09/2023 0.2  0.2 or 1.0 E.U./dL Final   Nitrite, UA 14/78/2956 Negative  Negative Final   Leukocytes, UA 11/09/2023 Small (1+) (A)  Negative Final   Urine Culture, Routine 11/09/2023 Final report   Final   Organism ID, Bacteria 11/09/2023 Comment   Final   Comment: Mixed urogenital flora Less than 10,000 colonies/mL     There are no preventive care reminders to display for this patient.   There are no preventive care reminders to display for this patient.  Lab Results  Component Value Date   TSH 1.740 08/12/2023   Lab Results  Component Value Date   WBC 9.0 10/23/2023   HGB 15.2 10/23/2023   HCT 44.0 10/23/2023   MCV 93 10/23/2023   PLT 305 10/23/2023   Lab Results  Component Value Date   NA 147 (H) 08/27/2023   K 4.4 08/27/2023   CO2 22 08/27/2023   GLUCOSE 76 08/27/2023  BUN 16 08/27/2023   CREATININE 1.04 (H) 08/27/2023   BILITOT 0.4 08/27/2023   ALKPHOS 156 (H) 08/27/2023   AST 19 08/27/2023   ALT 26 08/27/2023   PROT 7.1 08/27/2023   ALBUMIN 4.2 08/27/2023   CALCIUM 9.3 08/27/2023   ANIONGAP 9 08/25/2021   EGFR  63 08/27/2023   Lab Results  Component Value Date   CHOL 184 08/12/2023   Lab Results  Component Value Date   HDL 52 08/12/2023   Lab Results  Component Value Date   LDLCALC 104 (H) 08/12/2023   Lab Results  Component Value Date   TRIG 161 (H) 08/12/2023   Lab Results  Component Value Date   CHOLHDL 3.5 08/12/2023   Lab Results  Component Value Date   HGBA1C 6.1 (H) 08/12/2023      Assessment & Plan:   Problem List Items Addressed This Visit       Cardiovascular and Mediastinum   Benign hypertension - Primary   Relevant Orders   CBC with Differential/Platelet   Comprehensive metabolic panel   TSH Continue meds Follow up with cardiology as directed     Digestive   Gastroesophageal reflux disease without esophagitis Continue meds as directed     Other   Anxiety Continue meds as directed Follow up with therapist    Vitamin D deficiency   Relevant Orders   VITAMIN D 25 Hydroxy (Vit-D Deficiency, Fractures)   Mixed hyperlipidemia   Relevant Orders   Lipid panel Continue meds Watch diet  Prediabetes Watch diet Hgb a1c pending  CAD Continue current meds and follow up with cardiology as directed  Migraines Continue meds and follow up with neurology as directed           Follow-up: Return in about 4 months (around 04/15/2024) for chronic fasting follow-up.    SARA R Shuntae Herzig, PA-C

## 2023-12-16 ENCOUNTER — Encounter: Payer: Self-pay | Admitting: Physician Assistant

## 2023-12-16 LAB — HEMOGLOBIN A1C
Est. average glucose Bld gHb Est-mCnc: 114 mg/dL
Hgb A1c MFr Bld: 5.6 % (ref 4.8–5.6)

## 2023-12-16 LAB — COMPREHENSIVE METABOLIC PANEL WITH GFR
ALT: 58 IU/L — ABNORMAL HIGH (ref 0–32)
AST: 41 IU/L — ABNORMAL HIGH (ref 0–40)
Albumin: 4.1 g/dL (ref 3.8–4.9)
Alkaline Phosphatase: 163 IU/L — ABNORMAL HIGH (ref 44–121)
BUN/Creatinine Ratio: 14 (ref 9–23)
BUN: 16 mg/dL (ref 6–24)
Bilirubin Total: 0.6 mg/dL (ref 0.0–1.2)
CO2: 19 mmol/L — ABNORMAL LOW (ref 20–29)
Calcium: 8.8 mg/dL (ref 8.7–10.2)
Chloride: 110 mmol/L — ABNORMAL HIGH (ref 96–106)
Creatinine, Ser: 1.13 mg/dL — ABNORMAL HIGH (ref 0.57–1.00)
Globulin, Total: 2.9 g/dL (ref 1.5–4.5)
Glucose: 100 mg/dL — ABNORMAL HIGH (ref 70–99)
Potassium: 4.2 mmol/L (ref 3.5–5.2)
Sodium: 144 mmol/L (ref 134–144)
Total Protein: 7 g/dL (ref 6.0–8.5)
eGFR: 57 mL/min/{1.73_m2} — ABNORMAL LOW (ref >59)

## 2023-12-16 LAB — CBC WITH DIFFERENTIAL/PLATELET
Basophils Absolute: 0.1 10*3/uL (ref 0.0–0.2)
Basos: 1 %
EOS (ABSOLUTE): 0.6 10*3/uL — ABNORMAL HIGH (ref 0.0–0.4)
Eos: 8 %
Hematocrit: 43.5 % (ref 34.0–46.6)
Hemoglobin: 14.5 g/dL (ref 11.1–15.9)
Immature Grans (Abs): 0 10*3/uL (ref 0.0–0.1)
Immature Granulocytes: 0 %
Lymphocytes Absolute: 1.5 10*3/uL (ref 0.7–3.1)
Lymphs: 19 %
MCH: 31.3 pg (ref 26.6–33.0)
MCHC: 33.3 g/dL (ref 31.5–35.7)
MCV: 94 fL (ref 79–97)
Monocytes Absolute: 0.7 10*3/uL (ref 0.1–0.9)
Monocytes: 9 %
Neutrophils Absolute: 4.9 10*3/uL (ref 1.4–7.0)
Neutrophils: 63 %
Platelets: 272 10*3/uL (ref 150–450)
RBC: 4.63 x10E6/uL (ref 3.77–5.28)
RDW: 12.2 % (ref 11.7–15.4)
WBC: 7.8 10*3/uL (ref 3.4–10.8)

## 2023-12-16 LAB — LIPID PANEL
Chol/HDL Ratio: 3.7 ratio (ref 0.0–4.4)
Cholesterol, Total: 170 mg/dL (ref 100–199)
HDL: 46 mg/dL (ref >39)
LDL Chol Calc (NIH): 97 mg/dL (ref 0–99)
Triglycerides: 157 mg/dL — ABNORMAL HIGH (ref 0–149)
VLDL Cholesterol Cal: 27 mg/dL (ref 5–40)

## 2023-12-16 LAB — TSH: TSH: 3.34 u[IU]/mL (ref 0.450–4.500)

## 2023-12-16 LAB — VITAMIN D 25 HYDROXY (VIT D DEFICIENCY, FRACTURES): Vit D, 25-Hydroxy: 66.9 ng/mL (ref 30.0–100.0)

## 2023-12-23 ENCOUNTER — Encounter: Payer: Self-pay | Admitting: Pharmacist Clinician (PhC)/ Clinical Pharmacy Specialist

## 2023-12-23 ENCOUNTER — Ambulatory Visit: Attending: Cardiovascular Disease | Admitting: Pharmacist Clinician (PhC)/ Clinical Pharmacy Specialist

## 2023-12-23 ENCOUNTER — Encounter: Payer: Self-pay | Admitting: Physician Assistant

## 2023-12-23 DIAGNOSIS — J4540 Moderate persistent asthma, uncomplicated: Secondary | ICD-10-CM | POA: Diagnosis not present

## 2023-12-23 DIAGNOSIS — G4736 Sleep related hypoventilation in conditions classified elsewhere: Secondary | ICD-10-CM | POA: Diagnosis not present

## 2023-12-23 DIAGNOSIS — F411 Generalized anxiety disorder: Secondary | ICD-10-CM | POA: Diagnosis not present

## 2023-12-23 DIAGNOSIS — R5383 Other fatigue: Secondary | ICD-10-CM | POA: Diagnosis not present

## 2023-12-23 NOTE — Assessment & Plan Note (Signed)
 Patient is planning to stop medication secondary to issues with bladder emptying.  Encouraged her to follow up with urology/nephrology, as this is not a listed side effect.  She will reach out to them and determine whether or not to continue.     She was encouraged to taper off Sprite and go to [facility] for water aerobics.    Will follow up should patient decide to continue with medication.

## 2023-12-23 NOTE — Progress Notes (Signed)
 History of Present Illness    Cindy Hammond is a 58 y.o. female patient of Dr Emmette Harms, in the office today to discuss options for cholesterol management. She saw Katlyn West, NP on 06/22/23. She was having some intermittent chest discomfort that occurred mostly at rest, possibly due to fibromyalgia. She has chronic SOB on exertion, which showed improvements during her visit. Her recent lipid panel showed LDL of 127 and TG of 259. She is currently on atorvastatin  80 mg daily.  Today the patient is seen for hyperlipidemia follow-up. Overall, she is doing well with her medications. She has been experiencing some chest pain and was hospitalized a couple of weeks ago.   She is tolerating atorvastatin  80 mg. She is taking vitamin D  and krill oil. She mentions that she is interested in weight loss medication.  During her visit she also asked about possible use of weight management medications.  She has never taken these medications in the past, but has struggled with weight for some time.          12/23/2023, 11:16 AM       Office Visit    Patient Name: Caprina Wussow Date of Encounter: 12/23/2023  Primary Care Provider:  Cyndi Drain, PA-C Primary Cardiologist:  Kardie Tobb, DO  Chief Complaint    Weight management  Significant Past Medical History   HTN Controlled at last visits, on atenolol ,   migraine On Ubrelvy , has been to ED with stroke-like sx, ruled to be migraine  preDM 8/24 A1c 6   OSA Was recommended to reach out to pulmonary and re-start    Allergies  Allergen Reactions   Sumatriptan Hives and Rash   Sulfa Antibiotics     History of Present Illness    Cindy Hammond is a 58 y.o. female patient of Dr Emmette Harms, in the office today for weight management follow up.  I saw Ms Halbleib originally for lipid management, but she asked about weigh loss options.  We started her on Wegovy .  She started on the medication, but after the 0.5 mg doses, stopped for several weeks.  She then restarted  recently and is now back to 0.5 mg, but believes it may be causing her to have issues with bladder emptying.    Insurance Carrier: Medicaid [employer]  Current meds that may affect weight: quetiapine , pregabalin   Baseline weight/BMI: 239 lb // 46.68 Today's weight/BMI:  232.2 lb // 45.35 DIfference:  6.8 lb // 2.8%  Confirmed patient not pregnant and no personal or family history of medullary thyroid  carcinoma (MTC) or Multiple Endocrine Neoplasia syndrome type 2 (MEN 2).   Family Hx:  Mother (alive)-HLD, HTN Father (alive)- HLD, HTN  Social Hx: Tobacco: no Alcohol: no     Diet:    She enjoys eating grilled chicken and vegetables. For snacks, she will eat chips or cheez-its. She is working on reducing her Sprite intake.  Feels full before eats whole plate, but still drinking Sprite, 2-3 bottles per day.     Exercise: She mentions that she has osteoarthritis in her knees that is painful and limits her physical activity.  Wants to go to [employer] with spouse  Accessory Clinical Findings    Lab Results  Component Value Date   CREATININE 1.13 (H) 12/15/2023   BUN 16 12/15/2023   NA 144 12/15/2023   K 4.2 12/15/2023   CL 110 (H) 12/15/2023   CO2 19 (L) 12/15/2023   Lab Results  Component Value Date   ALT 58 (  H) 12/15/2023   AST 41 (H) 12/15/2023   ALKPHOS 163 (H) 12/15/2023   BILITOT 0.6 12/15/2023   Lab Results  Component Value Date   HGBA1C 5.6 12/15/2023      Home Medications/Allergies    Current Outpatient Medications  Medication Sig Dispense Refill   albuterol  (VENTOLIN  HFA) 108 (90 Base) MCG/ACT inhaler INHALE 1-2 PUFFS INTO THE LUNGS EVERY 6 HOURS AS NEEDED FOR WHEEZING OR SHORTNESS OF BREATH 8.5 g 2   amitriptyline  (ELAVIL ) 150 MG tablet TAKE 1 TABLET BY MOUTH DAILY AT BEDTIME. 90 tablet 0   atenolol  (TENORMIN ) 25 MG tablet Take 1 tablet (25 mg total) by mouth 2 (two) times daily. 180 tablet 2   atorvastatin  (LIPITOR) 80 MG tablet Take 1 tablet by mouth ONCE  daily. 90 tablet 1   busPIRone  (BUSPAR ) 30 MG tablet Take 1 tablet (30 mg total) by mouth 2 (two) times daily. 180 tablet 1   clonazePAM  (KLONOPIN ) 0.5 MG tablet TAKE ONE TABLET BY MOUTH TWICE DAILY 60 tablet 1   fenofibrate  (TRICOR ) 48 MG tablet Take 1 tablet (48 mg total) by mouth daily. 90 tablet 3   furosemide  (LASIX ) 40 MG tablet TAKE 1 TABLET BY MOUTH ONCE DAILY 90 tablet 1   KRILL OIL PO Take 1 capsule by mouth 2 (two) times daily.     meloxicam  (MOBIC ) 15 MG tablet Take 1 tablet (15 mg total) by mouth daily. 30 tablet 2   montelukast (SINGULAIR) 10 MG tablet Take 10 mg by mouth daily.     nitroGLYCERIN  (NITROSTAT ) 0.4 MG SL tablet PLACE 1 TABLET UNDER THE TONGUE AS NEEDED FOR CHEST PAIN ( MAY REPEAT EVERY5 MINUTES X 3) 25 tablet 3   omeprazole  (PRILOSEC) 40 MG capsule Take 1 capsule by mouth once daily. 90 capsule 1   pregabalin  (LYRICA ) 75 MG capsule Take 1 capsule by mouth 3 times daily. 270 capsule 0   QUEtiapine  (SEROQUEL ) 300 MG tablet Take 1 tablet by mouth once daily. AT BEDTIME 90 tablet 1   ranolazine  (RANEXA ) 500 MG 12 hr tablet Take 1 tablet by mouth twice daily. 60 tablet 11   topiramate  (TOPAMAX ) 100 MG tablet Take 1 tablet by mouth 3 times daily. 270 tablet 0   Ubrogepant  (UBRELVY ) 100 MG TABS Take 100 mg by mouth 2 (two) times daily as needed (migraine).     Vitamin D , Ergocalciferol , (DRISDOL ) 1.25 MG (50000 UNIT) CAPS capsule Take 1 capsule by mouth twice weekly as directed. 10 capsule 1   No current facility-administered medications for this visit.     Allergies  Allergen Reactions   Sumatriptan Hives and Rash   Sulfa Antibiotics     Assessment & Plan    Morbid obesity [facility]) Patient is planning to stop medication secondary to issues with bladder emptying.  Encouraged her to follow up with urology/nephrology, as this is not a listed side effect.  She will reach out to them and determine whether or not to continue.     She was encouraged to taper off Sprite and  go to [facility] for water aerobics.    Will follow up should patient decide to continue with medication.     Sabas Frett PharmD CPP [facility]  [address] [place], [place] [address] [phone]

## 2023-12-23 NOTE — Patient Instructions (Signed)
  TIPS FOR SUCCESS Write down the reasons why you want to lose weight and post it in a place where you'll see it often. Start small and work your way up. Keep in mind that it takes time to achieve goals, and small steps add up. Any additional movements help to burn calories. Taking the stairs rather than the elevator and parking at the far end of your parking lot are easy ways to start. Brisk walking for at least 30 minutes 4 or more days of the week is an excellent goal to work toward  [employer] WHAT IT MEANS TO FEEL FULL Did you know that it can take 15 minutes or more for your brain to receive the message that you've eaten? That means that, if you eat less food, but consume it slower, you may still feel satisfied. Eating a lot of fruits and vegetables can also help you feel fuller. Eat off of smaller plates so that moderate portions don't seem too small  TITRATION PLAN Will plan to follow the titration plan as below, pending patient is tolerating each dose before increasing to the next. Can slow titration if needed for tolerability.    REACH OUT THROUGH MY CHART WHEN YOU ARE READY TO CONTINUE  If you have any questions or concerns, please reach out to us .  Trinda Harlacher/Chris at [phone].  THANK YOU FOR CHOOSING [facility]

## 2023-12-24 ENCOUNTER — Other Ambulatory Visit: Payer: Self-pay | Admitting: Physician Assistant

## 2023-12-25 ENCOUNTER — Encounter: Payer: Self-pay | Admitting: Physician Assistant

## 2023-12-25 ENCOUNTER — Ambulatory Visit: Admitting: Physician Assistant

## 2023-12-25 VITALS — BP 110/80 | HR 79 | Temp 97.8°F | Resp 18 | Ht 60.0 in | Wt 235.0 lb

## 2023-12-25 DIAGNOSIS — N3001 Acute cystitis with hematuria: Secondary | ICD-10-CM

## 2023-12-25 LAB — POCT URINALYSIS DIP (CLINITEK)
Bilirubin, UA: NEGATIVE
Glucose, UA: NEGATIVE mg/dL
Ketones, POC UA: NEGATIVE mg/dL
Nitrite, UA: NEGATIVE
POC PROTEIN,UA: NEGATIVE
Spec Grav, UA: 1.015 (ref 1.010–1.025)
Urobilinogen, UA: 0.2 U/dL
pH, UA: 5.5 (ref 5.0–8.0)

## 2023-12-25 MED ORDER — PHENAZOPYRIDINE HCL 200 MG PO TABS: 200.0000 mg | ORAL_TABLET | Freq: Three times a day (TID) | ORAL | 0 refills | Status: DC | PRN

## 2023-12-25 MED ORDER — NITROFURANTOIN MONOHYD MACRO 100 MG PO CAPS: 100.0000 mg | ORAL_CAPSULE | Freq: Two times a day (BID) | ORAL | 0 refills | Status: DC

## 2023-12-25 NOTE — Progress Notes (Signed)
 Acute Office Visit  Subjective:    Patient ID: Cindy Hammond, female    DOB: 1965-09-20, 58 y.o.   MRN: [id number]  Chief Complaint  Patient presents with   Urinary Frequency   Dysuria    HPI: Patient is in today for complaints of urine frequency and that she is not emptying her bladder.  Says she has had issues for over a week.  Has had some mild dysuria .  Denies hematuria   Current Outpatient Medications:    albuterol  (VENTOLIN  HFA) 108 (90 Base) MCG/ACT inhaler, INHALE 1-2 PUFFS INTO THE LUNGS EVERY 6 HOURS AS NEEDED FOR WHEEZING OR SHORTNESS OF BREATH, Disp: 8.5 g, Rfl: 2   amitriptyline  (ELAVIL ) 150 MG tablet, TAKE 1 TABLET BY MOUTH DAILY AT BEDTIME., Disp: 90 tablet, Rfl: 0   atenolol  (TENORMIN ) 25 MG tablet, Take 1 tablet (25 mg total) by mouth 2 (two) times daily., Disp: 180 tablet, Rfl: 2   atorvastatin  (LIPITOR) 80 MG tablet, Take 1 tablet by mouth ONCE daily., Disp: 90 tablet, Rfl: 1   busPIRone  (BUSPAR ) 30 MG tablet, Take 1 tablet (30 mg total) by mouth 2 (two) times daily., Disp: 180 tablet, Rfl: 1   clonazePAM  (KLONOPIN ) 0.5 MG tablet, TAKE ONE TABLET BY MOUTH TWICE DAILY, Disp: 60 tablet, Rfl: 1   fenofibrate  (TRICOR ) 48 MG tablet, Take 1 tablet (48 mg total) by mouth daily., Disp: 90 tablet, Rfl: 3   furosemide  (LASIX ) 40 MG tablet, TAKE 1 TABLET BY MOUTH ONCE DAILY, Disp: 90 tablet, Rfl: 1   KRILL OIL PO, Take 1 capsule by mouth 2 (two) times daily., Disp: , Rfl:    meloxicam  (MOBIC ) 15 MG tablet, Take 1 tablet (15 mg total) by mouth daily., Disp: 30 tablet, Rfl: 2   montelukast (SINGULAIR) 10 MG tablet, Take 10 mg by mouth daily., Disp: , Rfl:    nitrofurantoin , macrocrystal-monohydrate, (MACROBID ) 100 MG capsule, Take 1 capsule (100 mg total) by mouth 2 (two) times daily., Disp: 20 capsule, Rfl: 0   nitroGLYCERIN  (NITROSTAT ) 0.4 MG SL tablet, PLACE 1 TABLET UNDER THE TONGUE AS NEEDED FOR CHEST PAIN ( MAY REPEAT EVERY5 MINUTES X 3), Disp: 25 tablet, Rfl: 3    omeprazole  (PRILOSEC) 40 MG capsule, Take 1 capsule by mouth once daily., Disp: 90 capsule, Rfl: 1   phenazopyridine (PYRIDIUM) 200 MG tablet, Take 1 tablet (200 mg total) by mouth 3 (three) times daily as needed for pain., Disp: 10 tablet, Rfl: 0   pregabalin  (LYRICA ) 75 MG capsule, Take 1 capsule by mouth 3 times daily., Disp: 270 capsule, Rfl: 0   QUEtiapine  (SEROQUEL ) 300 MG tablet, Take 1 tablet by mouth once daily. AT BEDTIME, Disp: 90 tablet, Rfl: 1   ranolazine  (RANEXA ) 500 MG 12 hr tablet, Take 1 tablet by mouth twice daily., Disp: 60 tablet, Rfl: 11   Semaglutide -Weight Management 1 MG/0.5ML SOAJ, Inject 1 mg into the skin., Disp: , Rfl:    topiramate  (TOPAMAX ) 100 MG tablet, Take 1 tablet by mouth 3 times daily., Disp: 270 tablet, Rfl: 0   Ubrogepant  (UBRELVY ) 100 MG TABS, Take 100 mg by mouth 2 (two) times daily as needed (migraine)., Disp: , Rfl:    Vitamin D , Ergocalciferol , (DRISDOL ) 1.25 MG (50000 UNIT) CAPS capsule, Take 1 capsule by mouth twice weekly as directed., Disp: 10 capsule, Rfl: 1  Allergies  Allergen Reactions   Sumatriptan Hives and Rash   Sulfa Antibiotics     ROS CONSTITUTIONAL: Negative for chills, fatigue, fever,  CARDIOVASCULAR: Negative for chest pain, RESPIRATORY: Negative for recent cough and dyspnea.  GASTROINTESTINAL: Negative for abdominal pain, acid reflux symptoms, constipation, diarrhea, nausea and vomiting.  GU - see HPI      Objective:    PHYSICAL EXAM:   BP 110/80   Pulse 79   Temp 97.8 F (36.6 C) (Temporal)   Resp 18   Ht 5' (1.524 m)   Wt 235 lb (106.6 kg)   LMP  (LMP Unknown)   SpO2 98%   BMI 45.90 kg/m    GEN: Well nourished, well developed, in no acute distress   Cardiac: RRR; no murmurs,  Respiratory:  normal respiratory rate and pattern with no distress - normal breath sounds with no rales, rhonchi, wheezes or rubs GI: normal bowel sounds, no masses or tenderness  Office Visit on 12/25/2023  Component Date Value  Ref Range Status   Color, UA 12/25/2023 straw (A)  yellow Final   Clarity, UA 12/25/2023 cloudy (A)  clear Final   Glucose, UA 12/25/2023 negative  negative mg/dL Final   Bilirubin, UA 16/06/9603 negative  negative Final   Ketones, POC UA 12/25/2023 negative  negative mg/dL Final   Spec Grav, UA 54/05/8118 1.015  1.010 - 1.025 Final   Blood, UA 12/25/2023 trace-intact (A)  negative Final   pH, UA 12/25/2023 5.5  5.0 - 8.0 Final   POC PROTEIN,UA 12/25/2023 negative  negative, trace Final   Urobilinogen, UA 12/25/2023 0.2  0.2 or 1.0 E.U./dL Final   Nitrite, UA 14/78/2956 Negative  Negative Final   Leukocytes, UA 12/25/2023 Moderate (2+) (A)  Negative Final        Assessment & Plan:    Acute hemorrhagic cystitis -     POCT URINALYSIS DIP (CLINITEK) -     Urine Culture -     Nitrofurantoin  Monohyd Macro; Take 1 capsule (100 mg total) by mouth 2 (two) times daily.  Dispense: 20 capsule; Refill: 0 -     Phenazopyridine HCl; Take 1 tablet (200 mg total) by mouth 3 (three) times daily as needed for pain.  Dispense: 10 tablet; Refill: 0     Follow-up: Return in about 3 weeks (around 01/15/2024) for follow-up.  An After Visit Summary was printed and given to the patient.  Anthonette Bastos [facility] [phone]

## 2023-12-27 LAB — URINE CULTURE

## 2023-12-28 ENCOUNTER — Encounter: Payer: Self-pay | Admitting: Physician Assistant

## 2023-12-29 ENCOUNTER — Other Ambulatory Visit: Payer: Self-pay | Admitting: Physician Assistant

## 2023-12-30 ENCOUNTER — Ambulatory Visit: Admitting: Physician Assistant

## 2023-12-30 ENCOUNTER — Other Ambulatory Visit: Payer: Self-pay | Admitting: Physician Assistant

## 2023-12-30 DIAGNOSIS — F419 Anxiety disorder, unspecified: Secondary | ICD-10-CM

## 2023-12-31 ENCOUNTER — Ambulatory Visit: Admitting: Physician Assistant

## 2024-01-05 ENCOUNTER — Ambulatory Visit: Payer: Medicaid Other | Attending: Cardiology | Admitting: Cardiology

## 2024-01-05 ENCOUNTER — Encounter: Payer: Self-pay | Admitting: Cardiology

## 2024-01-05 VITALS — BP 136/94 | HR 78 | Ht 60.0 in | Wt 240.0 lb

## 2024-01-05 DIAGNOSIS — I2510 Atherosclerotic heart disease of native coronary artery without angina pectoris: Secondary | ICD-10-CM | POA: Diagnosis not present

## 2024-01-05 DIAGNOSIS — R42 Dizziness and giddiness: Secondary | ICD-10-CM | POA: Insufficient documentation

## 2024-01-05 DIAGNOSIS — N189 Chronic kidney disease, unspecified: Secondary | ICD-10-CM | POA: Diagnosis not present

## 2024-01-05 DIAGNOSIS — Z01812 Encounter for preprocedural laboratory examination: Secondary | ICD-10-CM | POA: Diagnosis not present

## 2024-01-05 MED ORDER — TORSEMIDE 20 MG PO TABS: 20.0000 mg | ORAL_TABLET | Freq: Every day | ORAL | 3 refills | Status: DC

## 2024-01-05 NOTE — Progress Notes (Signed)
 Cardiology Office Note:    Date:  01/08/2024   ID:  Cindy Hammond, DOB 06/08/1966, MRN [id number]  PCP:  Cyndi Drain, PA-C  Cardiologist:  Cailin Gebel, DO  Electrophysiologist:  None   Referring MD: Cyndi Drain, PA-C   'I am short of breath and experiencing chest pain"   History of Present Illness:    Cindy Hammond is a 58 y.o. female with a hx of nonobstructive minimal coronary artery disease seen on coronary CTA done in January 2022, hypertension, hyperlipidemia, chronic diastolic heart failure is here today for follow-up visit.   At her last visit she was concerned about shortness of breath with an echocardiogram which was normal.    In the interim she did see Idella Major, NP at that time she appeared to be doing well from a cardiovascular standpoint she had chest discomfort but that seemed to be musculoskeletal in nature.  Today she tells me she is experiencing worsening shortness of breath on exertion and now this is different because she is experiencing some chest pressure.  She is really concerned about this.  She note that she can barely exert herself without getting significantly short of breath and then when this happens during that time she gets this pressure-like sensation that lasts 1 to 2 minutes.  No radiating factors to this.  Her husband in the office reports that he is concerned as well.  Past Medical History:  Diagnosis Date   Anxiety    Depression    GERD (gastroesophageal reflux disease)    Hyperlipidemia    Hypertension    Pain in joint, lower leg    Thrombocytopenia (HCC)     Past Surgical History:  Procedure Laterality Date   TUBAL LIGATION      Current Medications: Current Meds  Medication Sig   albuterol  (VENTOLIN  HFA) 108 (90 Base) MCG/ACT inhaler INHALE 1-2 PUFFS INTO THE LUNGS EVERY 6 HOURS AS NEEDED FOR WHEEZING OR SHORTNESS OF BREATH   amitriptyline  (ELAVIL ) 150 MG tablet TAKE 1 TABLET BY MOUTH DAILY AT BEDTIME.   atenolol  (TENORMIN ) 25 MG  tablet Take 1 tablet (25 mg total) by mouth 2 (two) times daily.   atorvastatin  (LIPITOR) 80 MG tablet Take 1 tablet by mouth ONCE daily.   clonazePAM  (KLONOPIN ) 0.5 MG tablet TAKE ONE TABLET BY MOUTH TWICE DAILY   fenofibrate  (TRICOR ) 48 MG tablet Take 1 tablet (48 mg total) by mouth daily.   KRILL OIL PO Take 1 capsule by mouth daily.   meloxicam  (MOBIC ) 15 MG tablet Take 1 tablet (15 mg total) by mouth daily.   montelukast (SINGULAIR) 10 MG tablet Take 10 mg by mouth daily.   nitroGLYCERIN  (NITROSTAT ) 0.4 MG SL tablet PLACE 1 TABLET UNDER THE TONGUE AS NEEDED FOR CHEST PAIN ( MAY REPEAT EVERY5 MINUTES X 3)   omeprazole  (PRILOSEC) 40 MG capsule Take 1 capsule by mouth once daily. (Patient taking differently: Take 40 mg by mouth daily as needed (Heartburn).)   phenazopyridine  (PYRIDIUM ) 200 MG tablet Take 1 tablet (200 mg total) by mouth 3 (three) times daily as needed for pain.   pregabalin  (LYRICA ) 75 MG capsule Take 1 capsule by mouth 3 times daily.   QUEtiapine  (SEROQUEL ) 300 MG tablet Take 1 tablet by mouth once daily. AT BEDTIME   ranolazine  (RANEXA ) 500 MG 12 hr tablet Take 1 tablet by mouth twice daily. (Patient taking differently: Take 500 mg by mouth at bedtime.)   topiramate  (TOPAMAX ) 100 MG tablet Take 1 tablet by mouth 3  times daily. (Patient taking differently: Take 100 mg by mouth 2 (two) times daily.)   torsemide  (DEMADEX ) 20 MG tablet Take 1 tablet (20 mg total) by mouth daily.   Ubrogepant  (UBRELVY ) 100 MG TABS Take 100 mg by mouth 2 (two) times daily as needed (migraine).   Vitamin D , Ergocalciferol , (DRISDOL ) 1.25 MG (50000 UNIT) CAPS capsule Take 1 capsule by mouth twice weekly as directed. (Patient taking differently: Take 100,000 Units by mouth every 7 (seven) days.)   [DISCONTINUED] busPIRone  (BUSPAR ) 30 MG tablet Take 1 tablet (30 mg total) by mouth 2 (two) times daily. (Patient not taking: Reported on 01/08/2024)   [DISCONTINUED] furosemide  (LASIX ) 40 MG tablet TAKE 1  TABLET BY MOUTH ONCE DAILY   [DISCONTINUED] nitrofurantoin , macrocrystal-monohydrate, (MACROBID ) 100 MG capsule Take 1 capsule (100 mg total) by mouth 2 (two) times daily. (Patient not taking: Reported on 01/08/2024)     Allergies:   Sumatriptan and Sulfa antibiotics   Social History   Socioeconomic History   Marital status: Married    Spouse name: Not on file   Number of children: 3   Years of education: Not on file   Highest education level: Not on file  Occupational History   Occupation: Unemployed  Tobacco Use   Smoking status: Former    Current packs/day: 0.00    Average packs/day: 0.5 packs/day for 10.0 years (5.0 ttl pk-yrs)    Types: Cigarettes    Start date: 37    Quit date: 2000    Years since quitting: 25.3   Smokeless tobacco: Never  Vaping Use   Vaping status: Never Used  Substance and Sexual Activity   Alcohol use: No    Alcohol/week: 0.0 standard drinks of alcohol   Drug use: No   Sexual activity: Yes    Partners: Male  Other Topics Concern   Not on file  Social History Narrative   Not on file   Social Drivers of Health   Financial Resource Strain: Low Risk  (04/07/2023)   Overall Financial Resource Strain (CARDIA)    Difficulty of Paying Living Expenses: Not hard at all  Food Insecurity: No Food Insecurity (04/07/2023)   Hunger Vital Sign    Worried About Running Out of Food in the Last Year: Never true    Ran Out of Food in the Last Year: Never true  Transportation Needs: No Transportation Needs (04/07/2023)   PRAPARE - Administrator, Civil Service (Medical): No    Lack of Transportation (Non-Medical): No  Physical Activity: Inactive (04/07/2023)   Exercise Vital Sign    Days of Exercise per Week: 0 days    Minutes of Exercise per Session: 0 min  Stress: No Stress Concern Present (04/07/2023)   [employer] of Occupational Health - Occupational Stress Questionnaire    Feeling of Stress : Not at all  Social Connections: Moderately  Isolated (04/07/2023)   Social Connection and Isolation Panel [NHANES]    Frequency of Communication with Friends and Family: More than three times a week    Frequency of Social Gatherings with Friends and Family: More than three times a week    Attends Religious Services: Never    Database administrator or Organizations: No    Attends Engineer, structural: Never    Marital Status: Married     Family History: The patient's family history includes Hyperlipidemia in her father and mother; Hypertension in her father and mother.  ROS:   Review of Systems  Constitution: Negative for decreased appetite, fever and weight gain.  HENT: Negative for congestion, ear discharge, hoarse voice and sore throat.   Eyes: Negative for discharge, redness, vision loss in right eye and visual halos.  Cardiovascular: Negative for chest pain, dyspnea on exertion, leg swelling, orthopnea and palpitations.  Respiratory: Negative for cough, hemoptysis, shortness of breath and snoring.   Endocrine: Negative for heat intolerance and polyphagia.  Hematologic/Lymphatic: Negative for bleeding problem. Does not bruise/bleed easily.  Skin: Negative for flushing, nail changes, rash and suspicious lesions.  Musculoskeletal: Negative for arthritis, joint pain, muscle cramps, myalgias, neck pain and stiffness.  Gastrointestinal: Negative for abdominal pain, bowel incontinence, diarrhea and excessive appetite.  Genitourinary: Negative for decreased libido, genital sores and incomplete emptying.  Neurological: Negative for brief paralysis, focal weakness, headaches and loss of balance.  Psychiatric/Behavioral: Negative for altered mental status, depression and suicidal ideas.  Allergic/Immunologic: Negative for HIV exposure and persistent infections.    EKGs/Labs/Other Studies Reviewed:    The following studies were reviewed today:   EKG:  The ekg ordered today demonstrates sinus rhythm, heart rate 70  bpm  Recent Labs: 12/15/2023: TSH 3.340 01/05/2024: ALT 78; BUN 18; Creatinine, Ser 1.12; Hemoglobin 13.9; Magnesium 2.1; Platelets 182; Potassium 4.4; Sodium 144  Recent Lipid Panel    Component Value Date/Time   CHOL 170 12/15/2023 0841   TRIG 157 (H) 12/15/2023 0841   HDL 46 12/15/2023 0841   CHOLHDL 3.7 12/15/2023 0841   LDLCALC 97 12/15/2023 0841    Physical Exam:    VS:  BP (!) 136/94 (BP Location: Right Arm, Patient Position: Sitting, Cuff Size: Normal)   Pulse 78   Ht 5' (1.524 m)   Wt 240 lb (108.9 kg)   LMP  (LMP Unknown)   SpO2 94%   BMI 46.87 kg/m     Wt Readings from Last 3 Encounters:  01/05/24 240 lb (108.9 kg)  12/25/23 235 lb (106.6 kg)  12/23/23 232 lb 3.2 oz (105.3 kg)     GEN: Well nourished, well developed in no acute distress HEENT: Normal NECK: No JVD; No carotid bruits LYMPHATICS: No lymphadenopathy CARDIAC: S1S2 noted,RRR, no murmurs, rubs, gallops RESPIRATORY:  Clear to auscultation without rales, wheezing or rhonchi  ABDOMEN: Soft, non-tender, non-distended, +bowel sounds, no guarding. EXTREMITIES: No edema, No cyanosis, no clubbing MUSCULOSKELETAL:  No deformity  SKIN: Warm and dry NEUROLOGIC:  Alert and oriented x 3, non-focal PSYCHIATRIC:  Normal affect, good insight  ASSESSMENT:    1. Dizziness   2. Chronic kidney disease, unspecified CKD stage   3. Pre-procedure lab exam   4. Coronary artery disease involving native coronary artery of native heart, unspecified whether angina present   5. Morbid obesity (HCC)      PLAN:    Dyspnea on exertion I believe is multifactorial but I am concerned with the new characteristics and a chest pressure associated with this this may be her anginal symptom - she has know CAD from [facility] in 2022.  For this it would be better to proceed with an ischemic evaluation on this patient as well as understanding her right heart pressures.  Therefore at this time the best testing for her would be a right and  left heart catheterization.  The patient understands that risks include but are not limited to stroke (1 in 1000), death (1 in 1000), kidney failure [usually temporary] (1 in 500), bleeding (1 in 200), allergic reaction [possibly serious] (1 in 200), and agrees to proceed.  She  also have significant lower extremity edema not responding to the furosemide .  Going to transition her from furosemide  to torsemide .  Most recent creatinine was 1.13 with GFR at 57.  She follows pulmonologist in [place] who monitors her OSA and has transition her off of CPAP to oxygen at night..   The patient understands the need to lose weight with diet and exercise. We have discussed specific strategies for this.  The patient is in agreement with the above plan. The patient left the office in stable condition.  The patient will follow up in   Medication Adjustments/Labs and Tests Ordered: Current medicines are reviewed at length with the patient today.  Concerns regarding medicines are outlined above.  Orders Placed This Encounter  Procedures   Comp Met (CMET)   Magnesium   CBC   Ambulatory referral to Nephrology   Cardiac event monitor   Meds ordered this encounter  Medications   torsemide  (DEMADEX ) 20 MG tablet    Sig: Take 1 tablet (20 mg total) by mouth daily.    Dispense:  90 tablet    Refill:  3    Patient Instructions  Medication Instructions:  Your physician has recommended you make the following change in your medication:  STOP: Lasix  START: Torsemide  20 mg once daily.  *If you need a refill on your cardiac medications before your next appointment, please call your pharmacy*  Lab Work: CMET, Mag, CBC If you have labs (blood work) drawn today and your tests are completely normal, you will receive your results only by: MyChart Message (if you have MyChart) OR A paper copy in the mail If you have any lab test that is abnormal or we need to change your treatment, we will call you to review  the results.  Testing/Procedures: Your physician has recommended that you wear an event monitor. Event monitors are medical devices that record the heart's electrical activity. Doctors most often us  these monitors to diagnose arrhythmias. Arrhythmias are problems with the speed or rhythm of the heartbeat. The monitor is a small, portable device. You can wear one while you do your normal daily activities. This is usually used to diagnose what is causing palpitations/syncope (passing out).  [employer] Cardiac Event Monitor Instructions  Your physician has requested you wear your cardiac event monitor for 30 days. [employer] may call or text to confirm a shipping address. The monitor will be sent to a land address via UPS. [employer] will not ship a monitor to a PO BOX. It typically takes 3-5 days to receive your monitor after it has been enrolled. [employer] will assist with USPS tracking if your package is delayed. The telephone number for [employer] is [phone]. Once you have received your monitor, please review the enclosed instructions. Instruction tutorials can also be viewed under help and settings on the enclosed cell phone. Your monitor has already been registered assigning a specific monitor serial # to you.  Billing and Self Pay Discount Information  [employer] has been provided the insurance information we had on file for you.  If your insurance has been updated, please call [employer] at [phone] to provide them with your updated insurance information.   [employer] offers a discounted Self Pay option for patients who have insurance that does not cover their cardiac event monitor or patients without insurance.  The discounted cost of a Self Pay Cardiac Event Monitor would be $225.00 , if the patient contacts [employer] at [phone] within 7 days of applying the monitor to make payment arrangements.  If the patient does not contact [employer] within 7 days of applying the  monitor, the cost of the cardiac event monitor will be $350.00.  Applying the monitor  Remove cell phone from case and turn it on. The cell phone works as IT consultant and needs to be within [employer] of you at all times. The cell phone will need to be charged on a daily basis. We recommend you plug the cell phone into the enclosed charger at your bedside table every night.  Monitor batteries: You will receive two monitor batteries labelled #1 and #2. These are your recorders. Plug battery #2 onto the second connection on the enclosed charger. Keep one battery on the charger at all times. This will keep the monitor battery deactivated. It will also keep it fully charged for when you need to switch your monitor batteries. A small light will be blinking on the battery emblem when it is charging. The light on the battery emblem will remain on when the battery is fully charged.  Open package of a Monitor strip. Insert battery #1 into black hood on strip and gently squeeze monitor battery onto connection as indicated in instruction booklet. Set aside while preparing skin.  Choose location for your strip, vertical or horizontal, as indicated in the instruction booklet. Shave to remove all hair from location. There cannot be any lotions, oils, powders, or colognes on skin where monitor is to be applied. Wipe skin clean with enclosed Saline wipe. Dry skin completely.  Peel paper labeled #1 off the back of the Monitor strip exposing the adhesive. Place the monitor on the chest in the vertical or horizontal position shown in the instruction booklet. One arrow on the monitor strip must be pointing upward. Carefully remove paper labeled #2, attaching remainder of strip to your skin. Try not to create any folds or wrinkles in the strip as you apply it.  Firmly press and release the circle in the center of the monitor battery. You will hear a small beep. This is turning the monitor battery on. The  heart emblem on the monitor battery will light up every 5 seconds if the monitor battery in turned on and connected to the patient securely. Do not push and hold the circle down as this turns the monitor battery off. The cell phone will locate the monitor battery. A screen will appear on the cell phone checking the connection of your monitor strip. This may read poor connection initially but change to good connection within the next minute. Once your monitor accepts the connection you will hear a series of 3 beeps followed by a climbing crescendo of beeps. A screen will appear on the cell phone showing the two monitor strip placement options. Touch the picture that demonstrates where you applied the monitor strip.  Your monitor strip and battery are waterproof. You are able to shower, bathe, or swim with the monitor on. They just ask you do not submerge deeper than 3 feet underwater. We recommend removing the monitor if you are swimming in a lake, river, or ocean.  Your monitor battery will need to be switched to a fully charged monitor battery approximately once a week. The cell phone will alert you of an action which needs to be made.  On the cell phone, tap for details to reveal connection status, monitor battery status, and cell phone battery status. The green dots indicates your monitor is in good status. A red dot indicates there is something that needs your  attention.  To record a symptom, click the circle on the monitor battery. In 30-60 seconds a list of symptoms will appear on the cell phone. Select your symptom and tap save. Your monitor will record a sustained or significant arrhythmia regardless of you clicking the button. Some patients do not feel the heart rhythm irregularities. [employer] will notify us  of any serious or critical events.  Refer to instruction booklet for instructions on switching batteries, changing strips, the Do not disturb or Pause features, or any  additional questions.  Call [employer] at [phone], to confirm your monitor is transmitting and record your baseline. They will answer any questions you may have regarding the monitor instructions at that time.  Returning the monitor to [employer]  Place all equipment back into blue box. Peel off strip of paper to expose adhesive and close box securely. There is a prepaid UPS shipping label on this box. Drop in a UPS drop box, or at a UPS facility like [employer]. You may also contact [employer] to arrange UPS to pick up monitor package at your home.   Follow-Up: At [facility], you and your health needs are our priority.  As part of our continuing mission to provide you with exceptional heart care, our providers are all part of one team.  This team includes your primary Cardiologist (physician) and Advanced Practice Providers or APPs (Physician Assistants and Nurse Practitioners) who all work together to provide you with the care you need, when you need it.  Your next appointment:   2 week(s)  Provider:   One of our Advanced Practice Providers (APPs): Melita Springer, PA-C  Friddie Jetty, NP Evaline Hill, NP  Theotis Flake, PA-C Lawana Pray, NP  Willis Harter, PA-C Lovette Rud, PA-C  [place], PA-C Ernest Dick, NP  Marlana Silvan, NP Marcie Sever, PA-C  Laquita Plant, PA-C    Dayna Dunn, PA-C  Marlyse Single, PA-C Palmer Bobo, NP Katlyn West, NP Callie Goodrich, PA-C  Evan Williams, PA-C Sheng Haley, PA-C  Xika Zhao, NP Kathleen Johnson, PA-C      Adopting a Healthy Lifestyle.  Know what a healthy weight is for you (roughly BMI <25) and aim to maintain this   Aim for 7+ servings of fruits and vegetables daily   65-80+ fluid ounces of water or unsweet tea for healthy kidneys   Limit to max 1 drink of alcohol per day; avoid smoking/tobacco   Limit animal fats in diet for cholesterol and heart health - choose grass fed whenever available   Avoid highly  processed foods, and foods high in saturated/trans fats   Aim for low stress - take time to unwind and care for your mental health   Aim for 150 min of moderate intensity exercise weekly for heart health, and weights twice weekly for bone health   Aim for 7-9 hours of sleep daily   When it comes to diets, agreement about the perfect plan isnt easy to find, even among the experts. Experts at the [facility] of [employer] developed an idea known as the Healthy Eating Plate. Just imagine a plate divided into logical, healthy portions.   The emphasis is on diet quality:   Load up on vegetables and fruits - one-half of your plate: Aim for color and variety, and remember that potatoes dont count.   Go for whole grains - one-quarter of your plate: Whole wheat, barley, wheat berries, quinoa, oats, brown rice, and foods made with them. If you want pasta, go  with whole wheat pasta.   Protein power - one-quarter of your plate: Fish, chicken, beans, and nuts are all healthy, versatile protein sources. Limit red meat.   The diet, however, does go beyond the plate, offering a few other suggestions.   Use healthy plant oils, such as olive, canola, soy, corn, sunflower and peanut. Check the labels, and avoid partially hydrogenated oil, which have unhealthy trans fats.   If youre thirsty, drink water. Coffee and tea are good in moderation, but skip sugary drinks and limit milk and dairy products to one or two daily servings.   The type of carbohydrate in the diet is more important than the amount. Some sources of carbohydrates, such as vegetables, fruits, whole grains, and beans-are healthier than others.   Finally, stay active  Signed, Jerryl Morin, DO  01/08/2024 8:08 PM    [facility]

## 2024-01-05 NOTE — H&P (View-Only) (Signed)
 Cardiology Office Note:    Date:  01/08/2024   ID:  Cindy Hammond, DOB 06/08/1966, MRN [id number]  PCP:  Cyndi Drain, PA-C  Cardiologist:  Cailin Gebel, DO  Electrophysiologist:  None   Referring MD: Cyndi Drain, PA-C   'I am short of breath and experiencing chest pain"   History of Present Illness:    Cindy Hammond is a 58 y.o. female with a hx of nonobstructive minimal coronary artery disease seen on coronary CTA done in January 2022, hypertension, hyperlipidemia, chronic diastolic heart failure is here today for follow-up visit.   At her last visit she was concerned about shortness of breath with an echocardiogram which was normal.    In the interim she did see Idella Major, NP at that time she appeared to be doing well from a cardiovascular standpoint she had chest discomfort but that seemed to be musculoskeletal in nature.  Today she tells me she is experiencing worsening shortness of breath on exertion and now this is different because she is experiencing some chest pressure.  She is really concerned about this.  She note that she can barely exert herself without getting significantly short of breath and then when this happens during that time she gets this pressure-like sensation that lasts 1 to 2 minutes.  No radiating factors to this.  Her husband in the office reports that he is concerned as well.  Past Medical History:  Diagnosis Date   Anxiety    Depression    GERD (gastroesophageal reflux disease)    Hyperlipidemia    Hypertension    Pain in joint, lower leg    Thrombocytopenia (HCC)     Past Surgical History:  Procedure Laterality Date   TUBAL LIGATION      Current Medications: Current Meds  Medication Sig   albuterol  (VENTOLIN  HFA) 108 (90 Base) MCG/ACT inhaler INHALE 1-2 PUFFS INTO THE LUNGS EVERY 6 HOURS AS NEEDED FOR WHEEZING OR SHORTNESS OF BREATH   amitriptyline  (ELAVIL ) 150 MG tablet TAKE 1 TABLET BY MOUTH DAILY AT BEDTIME.   atenolol  (TENORMIN ) 25 MG  tablet Take 1 tablet (25 mg total) by mouth 2 (two) times daily.   atorvastatin  (LIPITOR) 80 MG tablet Take 1 tablet by mouth ONCE daily.   clonazePAM  (KLONOPIN ) 0.5 MG tablet TAKE ONE TABLET BY MOUTH TWICE DAILY   fenofibrate  (TRICOR ) 48 MG tablet Take 1 tablet (48 mg total) by mouth daily.   KRILL OIL PO Take 1 capsule by mouth daily.   meloxicam  (MOBIC ) 15 MG tablet Take 1 tablet (15 mg total) by mouth daily.   montelukast (SINGULAIR) 10 MG tablet Take 10 mg by mouth daily.   nitroGLYCERIN  (NITROSTAT ) 0.4 MG SL tablet PLACE 1 TABLET UNDER THE TONGUE AS NEEDED FOR CHEST PAIN ( MAY REPEAT EVERY5 MINUTES X 3)   omeprazole  (PRILOSEC) 40 MG capsule Take 1 capsule by mouth once daily. (Patient taking differently: Take 40 mg by mouth daily as needed (Heartburn).)   phenazopyridine  (PYRIDIUM ) 200 MG tablet Take 1 tablet (200 mg total) by mouth 3 (three) times daily as needed for pain.   pregabalin  (LYRICA ) 75 MG capsule Take 1 capsule by mouth 3 times daily.   QUEtiapine  (SEROQUEL ) 300 MG tablet Take 1 tablet by mouth once daily. AT BEDTIME   ranolazine  (RANEXA ) 500 MG 12 hr tablet Take 1 tablet by mouth twice daily. (Patient taking differently: Take 500 mg by mouth at bedtime.)   topiramate  (TOPAMAX ) 100 MG tablet Take 1 tablet by mouth 3  times daily. (Patient taking differently: Take 100 mg by mouth 2 (two) times daily.)   torsemide  (DEMADEX ) 20 MG tablet Take 1 tablet (20 mg total) by mouth daily.   Ubrogepant  (UBRELVY ) 100 MG TABS Take 100 mg by mouth 2 (two) times daily as needed (migraine).   Vitamin D , Ergocalciferol , (DRISDOL ) 1.25 MG (50000 UNIT) CAPS capsule Take 1 capsule by mouth twice weekly as directed. (Patient taking differently: Take 100,000 Units by mouth every 7 (seven) days.)   [DISCONTINUED] busPIRone  (BUSPAR ) 30 MG tablet Take 1 tablet (30 mg total) by mouth 2 (two) times daily. (Patient not taking: Reported on 01/08/2024)   [DISCONTINUED] furosemide  (LASIX ) 40 MG tablet TAKE 1  TABLET BY MOUTH ONCE DAILY   [DISCONTINUED] nitrofurantoin , macrocrystal-monohydrate, (MACROBID ) 100 MG capsule Take 1 capsule (100 mg total) by mouth 2 (two) times daily. (Patient not taking: Reported on 01/08/2024)     Allergies:   Sumatriptan and Sulfa antibiotics   Social History   Socioeconomic History   Marital status: Married    Spouse name: Not on file   Number of children: 3   Years of education: Not on file   Highest education level: Not on file  Occupational History   Occupation: Unemployed  Tobacco Use   Smoking status: Former    Current packs/day: 0.00    Average packs/day: 0.5 packs/day for 10.0 years (5.0 ttl pk-yrs)    Types: Cigarettes    Start date: 37    Quit date: 2000    Years since quitting: 25.3   Smokeless tobacco: Never  Vaping Use   Vaping status: Never Used  Substance and Sexual Activity   Alcohol use: No    Alcohol/week: 0.0 standard drinks of alcohol   Drug use: No   Sexual activity: Yes    Partners: Male  Other Topics Concern   Not on file  Social History Narrative   Not on file   Social Drivers of Health   Financial Resource Strain: Low Risk  (04/07/2023)   Overall Financial Resource Strain (CARDIA)    Difficulty of Paying Living Expenses: Not hard at all  Food Insecurity: No Food Insecurity (04/07/2023)   Hunger Vital Sign    Worried About Running Out of Food in the Last Year: Never true    Ran Out of Food in the Last Year: Never true  Transportation Needs: No Transportation Needs (04/07/2023)   PRAPARE - Administrator, Civil Service (Medical): No    Lack of Transportation (Non-Medical): No  Physical Activity: Inactive (04/07/2023)   Exercise Vital Sign    Days of Exercise per Week: 0 days    Minutes of Exercise per Session: 0 min  Stress: No Stress Concern Present (04/07/2023)   [employer] of Occupational Health - Occupational Stress Questionnaire    Feeling of Stress : Not at all  Social Connections: Moderately  Isolated (04/07/2023)   Social Connection and Isolation Panel [NHANES]    Frequency of Communication with Friends and Family: More than three times a week    Frequency of Social Gatherings with Friends and Family: More than three times a week    Attends Religious Services: Never    Database administrator or Organizations: No    Attends Engineer, structural: Never    Marital Status: Married     Family History: The patient's family history includes Hyperlipidemia in her father and mother; Hypertension in her father and mother.  ROS:   Review of Systems  Constitution: Negative for decreased appetite, fever and weight gain.  HENT: Negative for congestion, ear discharge, hoarse voice and sore throat.   Eyes: Negative for discharge, redness, vision loss in right eye and visual halos.  Cardiovascular: Negative for chest pain, dyspnea on exertion, leg swelling, orthopnea and palpitations.  Respiratory: Negative for cough, hemoptysis, shortness of breath and snoring.   Endocrine: Negative for heat intolerance and polyphagia.  Hematologic/Lymphatic: Negative for bleeding problem. Does not bruise/bleed easily.  Skin: Negative for flushing, nail changes, rash and suspicious lesions.  Musculoskeletal: Negative for arthritis, joint pain, muscle cramps, myalgias, neck pain and stiffness.  Gastrointestinal: Negative for abdominal pain, bowel incontinence, diarrhea and excessive appetite.  Genitourinary: Negative for decreased libido, genital sores and incomplete emptying.  Neurological: Negative for brief paralysis, focal weakness, headaches and loss of balance.  Psychiatric/Behavioral: Negative for altered mental status, depression and suicidal ideas.  Allergic/Immunologic: Negative for HIV exposure and persistent infections.    EKGs/Labs/Other Studies Reviewed:    The following studies were reviewed today:   EKG:  The ekg ordered today demonstrates sinus rhythm, heart rate 70  bpm  Recent Labs: 12/15/2023: TSH 3.340 01/05/2024: ALT 78; BUN 18; Creatinine, Ser 1.12; Hemoglobin 13.9; Magnesium 2.1; Platelets 182; Potassium 4.4; Sodium 144  Recent Lipid Panel    Component Value Date/Time   CHOL 170 12/15/2023 0841   TRIG 157 (H) 12/15/2023 0841   HDL 46 12/15/2023 0841   CHOLHDL 3.7 12/15/2023 0841   LDLCALC 97 12/15/2023 0841    Physical Exam:    VS:  BP (!) 136/94 (BP Location: Right Arm, Patient Position: Sitting, Cuff Size: Normal)   Pulse 78   Ht 5' (1.524 m)   Wt 240 lb (108.9 kg)   LMP  (LMP Unknown)   SpO2 94%   BMI 46.87 kg/m     Wt Readings from Last 3 Encounters:  01/05/24 240 lb (108.9 kg)  12/25/23 235 lb (106.6 kg)  12/23/23 232 lb 3.2 oz (105.3 kg)     GEN: Well nourished, well developed in no acute distress HEENT: Normal NECK: No JVD; No carotid bruits LYMPHATICS: No lymphadenopathy CARDIAC: S1S2 noted,RRR, no murmurs, rubs, gallops RESPIRATORY:  Clear to auscultation without rales, wheezing or rhonchi  ABDOMEN: Soft, non-tender, non-distended, +bowel sounds, no guarding. EXTREMITIES: No edema, No cyanosis, no clubbing MUSCULOSKELETAL:  No deformity  SKIN: Warm and dry NEUROLOGIC:  Alert and oriented x 3, non-focal PSYCHIATRIC:  Normal affect, good insight  ASSESSMENT:    1. Dizziness   2. Chronic kidney disease, unspecified CKD stage   3. Pre-procedure lab exam   4. Coronary artery disease involving native coronary artery of native heart, unspecified whether angina present   5. Morbid obesity (HCC)      PLAN:    Dyspnea on exertion I believe is multifactorial but I am concerned with the new characteristics and a chest pressure associated with this this may be her anginal symptom - she has know CAD from [facility] in 2022.  For this it would be better to proceed with an ischemic evaluation on this patient as well as understanding her right heart pressures.  Therefore at this time the best testing for her would be a right and  left heart catheterization.  The patient understands that risks include but are not limited to stroke (1 in 1000), death (1 in 1000), kidney failure [usually temporary] (1 in 500), bleeding (1 in 200), allergic reaction [possibly serious] (1 in 200), and agrees to proceed.  She  also have significant lower extremity edema not responding to the furosemide .  Going to transition her from furosemide  to torsemide .  Most recent creatinine was 1.13 with GFR at 57.  She follows pulmonologist in [place] who monitors her OSA and has transition her off of CPAP to oxygen at night..   The patient understands the need to lose weight with diet and exercise. We have discussed specific strategies for this.  The patient is in agreement with the above plan. The patient left the office in stable condition.  The patient will follow up in   Medication Adjustments/Labs and Tests Ordered: Current medicines are reviewed at length with the patient today.  Concerns regarding medicines are outlined above.  Orders Placed This Encounter  Procedures   Comp Met (CMET)   Magnesium   CBC   Ambulatory referral to Nephrology   Cardiac event monitor   Meds ordered this encounter  Medications   torsemide  (DEMADEX ) 20 MG tablet    Sig: Take 1 tablet (20 mg total) by mouth daily.    Dispense:  90 tablet    Refill:  3    Patient Instructions  Medication Instructions:  Your physician has recommended you make the following change in your medication:  STOP: Lasix  START: Torsemide  20 mg once daily.  *If you need a refill on your cardiac medications before your next appointment, please call your pharmacy*  Lab Work: CMET, Mag, CBC If you have labs (blood work) drawn today and your tests are completely normal, you will receive your results only by: MyChart Message (if you have MyChart) OR A paper copy in the mail If you have any lab test that is abnormal or we need to change your treatment, we will call you to review  the results.  Testing/Procedures: Your physician has recommended that you wear an event monitor. Event monitors are medical devices that record the heart's electrical activity. Doctors most often us  these monitors to diagnose arrhythmias. Arrhythmias are problems with the speed or rhythm of the heartbeat. The monitor is a small, portable device. You can wear one while you do your normal daily activities. This is usually used to diagnose what is causing palpitations/syncope (passing out).  [employer] Cardiac Event Monitor Instructions  Your physician has requested you wear your cardiac event monitor for 30 days. [employer] may call or text to confirm a shipping address. The monitor will be sent to a land address via UPS. [employer] will not ship a monitor to a PO BOX. It typically takes 3-5 days to receive your monitor after it has been enrolled. [employer] will assist with USPS tracking if your package is delayed. The telephone number for [employer] is [phone]. Once you have received your monitor, please review the enclosed instructions. Instruction tutorials can also be viewed under help and settings on the enclosed cell phone. Your monitor has already been registered assigning a specific monitor serial # to you.  Billing and Self Pay Discount Information  [employer] has been provided the insurance information we had on file for you.  If your insurance has been updated, please call [employer] at [phone] to provide them with your updated insurance information.   [employer] offers a discounted Self Pay option for patients who have insurance that does not cover their cardiac event monitor or patients without insurance.  The discounted cost of a Self Pay Cardiac Event Monitor would be $225.00 , if the patient contacts [employer] at [phone] within 7 days of applying the monitor to make payment arrangements.  If the patient does not contact [employer] within 7 days of applying the  monitor, the cost of the cardiac event monitor will be $350.00.  Applying the monitor  Remove cell phone from case and turn it on. The cell phone works as IT consultant and needs to be within [employer] of you at all times. The cell phone will need to be charged on a daily basis. We recommend you plug the cell phone into the enclosed charger at your bedside table every night.  Monitor batteries: You will receive two monitor batteries labelled #1 and #2. These are your recorders. Plug battery #2 onto the second connection on the enclosed charger. Keep one battery on the charger at all times. This will keep the monitor battery deactivated. It will also keep it fully charged for when you need to switch your monitor batteries. A small light will be blinking on the battery emblem when it is charging. The light on the battery emblem will remain on when the battery is fully charged.  Open package of a Monitor strip. Insert battery #1 into black hood on strip and gently squeeze monitor battery onto connection as indicated in instruction booklet. Set aside while preparing skin.  Choose location for your strip, vertical or horizontal, as indicated in the instruction booklet. Shave to remove all hair from location. There cannot be any lotions, oils, powders, or colognes on skin where monitor is to be applied. Wipe skin clean with enclosed Saline wipe. Dry skin completely.  Peel paper labeled #1 off the back of the Monitor strip exposing the adhesive. Place the monitor on the chest in the vertical or horizontal position shown in the instruction booklet. One arrow on the monitor strip must be pointing upward. Carefully remove paper labeled #2, attaching remainder of strip to your skin. Try not to create any folds or wrinkles in the strip as you apply it.  Firmly press and release the circle in the center of the monitor battery. You will hear a small beep. This is turning the monitor battery on. The  heart emblem on the monitor battery will light up every 5 seconds if the monitor battery in turned on and connected to the patient securely. Do not push and hold the circle down as this turns the monitor battery off. The cell phone will locate the monitor battery. A screen will appear on the cell phone checking the connection of your monitor strip. This may read poor connection initially but change to good connection within the next minute. Once your monitor accepts the connection you will hear a series of 3 beeps followed by a climbing crescendo of beeps. A screen will appear on the cell phone showing the two monitor strip placement options. Touch the picture that demonstrates where you applied the monitor strip.  Your monitor strip and battery are waterproof. You are able to shower, bathe, or swim with the monitor on. They just ask you do not submerge deeper than 3 feet underwater. We recommend removing the monitor if you are swimming in a lake, river, or ocean.  Your monitor battery will need to be switched to a fully charged monitor battery approximately once a week. The cell phone will alert you of an action which needs to be made.  On the cell phone, tap for details to reveal connection status, monitor battery status, and cell phone battery status. The green dots indicates your monitor is in good status. A red dot indicates there is something that needs your  attention.  To record a symptom, click the circle on the monitor battery. In 30-60 seconds a list of symptoms will appear on the cell phone. Select your symptom and tap save. Your monitor will record a sustained or significant arrhythmia regardless of you clicking the button. Some patients do not feel the heart rhythm irregularities. [employer] will notify us  of any serious or critical events.  Refer to instruction booklet for instructions on switching batteries, changing strips, the Do not disturb or Pause features, or any  additional questions.  Call [employer] at [phone], to confirm your monitor is transmitting and record your baseline. They will answer any questions you may have regarding the monitor instructions at that time.  Returning the monitor to [employer]  Place all equipment back into blue box. Peel off strip of paper to expose adhesive and close box securely. There is a prepaid UPS shipping label on this box. Drop in a UPS drop box, or at a UPS facility like [employer]. You may also contact [employer] to arrange UPS to pick up monitor package at your home.   Follow-Up: At [facility], you and your health needs are our priority.  As part of our continuing mission to provide you with exceptional heart care, our providers are all part of one team.  This team includes your primary Cardiologist (physician) and Advanced Practice Providers or APPs (Physician Assistants and Nurse Practitioners) who all work together to provide you with the care you need, when you need it.  Your next appointment:   2 week(s)  Provider:   One of our Advanced Practice Providers (APPs): Melita Springer, PA-C  Friddie Jetty, NP Evaline Hill, NP  Theotis Flake, PA-C Lawana Pray, NP  Willis Harter, PA-C Lovette Rud, PA-C  [place], PA-C Ernest Dick, NP  Marlana Silvan, NP Marcie Sever, PA-C  Laquita Plant, PA-C    Dayna Dunn, PA-C  Marlyse Single, PA-C Palmer Bobo, NP Katlyn West, NP Callie Goodrich, PA-C  Evan Williams, PA-C Sheng Haley, PA-C  Xika Zhao, NP Kathleen Johnson, PA-C      Adopting a Healthy Lifestyle.  Know what a healthy weight is for you (roughly BMI <25) and aim to maintain this   Aim for 7+ servings of fruits and vegetables daily   65-80+ fluid ounces of water or unsweet tea for healthy kidneys   Limit to max 1 drink of alcohol per day; avoid smoking/tobacco   Limit animal fats in diet for cholesterol and heart health - choose grass fed whenever available   Avoid highly  processed foods, and foods high in saturated/trans fats   Aim for low stress - take time to unwind and care for your mental health   Aim for 150 min of moderate intensity exercise weekly for heart health, and weights twice weekly for bone health   Aim for 7-9 hours of sleep daily   When it comes to diets, agreement about the perfect plan isnt easy to find, even among the experts. Experts at the [facility] of [employer] developed an idea known as the Healthy Eating Plate. Just imagine a plate divided into logical, healthy portions.   The emphasis is on diet quality:   Load up on vegetables and fruits - one-half of your plate: Aim for color and variety, and remember that potatoes dont count.   Go for whole grains - one-quarter of your plate: Whole wheat, barley, wheat berries, quinoa, oats, brown rice, and foods made with them. If you want pasta, go  with whole wheat pasta.   Protein power - one-quarter of your plate: Fish, chicken, beans, and nuts are all healthy, versatile protein sources. Limit red meat.   The diet, however, does go beyond the plate, offering a few other suggestions.   Use healthy plant oils, such as olive, canola, soy, corn, sunflower and peanut. Check the labels, and avoid partially hydrogenated oil, which have unhealthy trans fats.   If youre thirsty, drink water. Coffee and tea are good in moderation, but skip sugary drinks and limit milk and dairy products to one or two daily servings.   The type of carbohydrate in the diet is more important than the amount. Some sources of carbohydrates, such as vegetables, fruits, whole grains, and beans-are healthier than others.   Finally, stay active  Signed, Jerryl Morin, DO  01/08/2024 8:08 PM    [facility]

## 2024-01-05 NOTE — Patient Instructions (Addendum)
 Medication Instructions:  Your physician has recommended you make the following change in your medication:  STOP: Lasix  START: Torsemide 20 mg once daily.  *If you need a refill on your cardiac medications before your next appointment, please call your pharmacy*  Lab Work: CMET, Mag, CBC If you have labs (blood work) drawn today and your tests are completely normal, you will receive your results only by: MyChart Message (if you have MyChart) OR A paper copy in the mail If you have any lab test that is abnormal or we need to change your treatment, we will call you to review the results.  Testing/Procedures: Your physician has recommended that you wear an event monitor. Event monitors are medical devices that record the heart's electrical activity. Doctors most often us  these monitors to diagnose arrhythmias. Arrhythmias are problems with the speed or rhythm of the heartbeat. The monitor is a small, portable device. You can wear one while you do your normal daily activities. This is usually used to diagnose what is causing palpitations/syncope (passing out).  [employer] Cardiac Event Monitor Instructions  Your physician has requested you wear your cardiac event monitor for 30 days. [employer] may call or text to confirm a shipping address. The monitor will be sent to a land address via UPS. [employer] will not ship a monitor to a PO BOX. It typically takes 3-5 days to receive your monitor after it has been enrolled. [employer] will assist with USPS tracking if your package is delayed. The telephone number for [employer] is [phone]. Once you have received your monitor, please review the enclosed instructions. Instruction tutorials can also be viewed under help and settings on the enclosed cell phone. Your monitor has already been registered assigning a specific monitor serial # to you.  Billing and Self Pay Discount Information  [employer] has been provided the insurance information  we had on file for you.  If your insurance has been updated, please call [employer] at [phone] to provide them with your updated insurance information.   [employer] offers a discounted Self Pay option for patients who have insurance that does not cover their cardiac event monitor or patients without insurance.  The discounted cost of a Self Pay Cardiac Event Monitor would be $225.00 , if the patient contacts [employer] at [phone] within 7 days of applying the monitor to make payment arrangements.  If the patient does not contact [employer] within 7 days of applying the monitor, the cost of the cardiac event monitor will be $350.00.  Applying the monitor  Remove cell phone from case and turn it on. The cell phone works as IT consultant and needs to be within [employer] of you at all times. The cell phone will need to be charged on a daily basis. We recommend you plug the cell phone into the enclosed charger at your bedside table every night.  Monitor batteries: You will receive two monitor batteries labelled #1 and #2. These are your recorders. Plug battery #2 onto the second connection on the enclosed charger. Keep one battery on the charger at all times. This will keep the monitor battery deactivated. It will also keep it fully charged for when you need to switch your monitor batteries. A small light will be blinking on the battery emblem when it is charging. The light on the battery emblem will remain on when the battery is fully charged.  Open package of a Monitor strip. Insert battery #1 into black hood on strip and gently squeeze monitor battery onto  connection as indicated in instruction booklet. Set aside while preparing skin.  Choose location for your strip, vertical or horizontal, as indicated in the instruction booklet. Shave to remove all hair from location. There cannot be any lotions, oils, powders, or colognes on skin where monitor is to be applied. Wipe skin clean with  enclosed Saline wipe. Dry skin completely.  Peel paper labeled #1 off the back of the Monitor strip exposing the adhesive. Place the monitor on the chest in the vertical or horizontal position shown in the instruction booklet. One arrow on the monitor strip must be pointing upward. Carefully remove paper labeled #2, attaching remainder of strip to your skin. Try not to create any folds or wrinkles in the strip as you apply it.  Firmly press and release the circle in the center of the monitor battery. You will hear a small beep. This is turning the monitor battery on. The heart emblem on the monitor battery will light up every 5 seconds if the monitor battery in turned on and connected to the patient securely. Do not push and hold the circle down as this turns the monitor battery off. The cell phone will locate the monitor battery. A screen will appear on the cell phone checking the connection of your monitor strip. This may read poor connection initially but change to good connection within the next minute. Once your monitor accepts the connection you will hear a series of 3 beeps followed by a climbing crescendo of beeps. A screen will appear on the cell phone showing the two monitor strip placement options. Touch the picture that demonstrates where you applied the monitor strip.  Your monitor strip and battery are waterproof. You are able to shower, bathe, or swim with the monitor on. They just ask you do not submerge deeper than 3 feet underwater. We recommend removing the monitor if you are swimming in a lake, river, or ocean.  Your monitor battery will need to be switched to a fully charged monitor battery approximately once a week. The cell phone will alert you of an action which needs to be made.  On the cell phone, tap for details to reveal connection status, monitor battery status, and cell phone battery status. The green dots indicates your monitor is in good status. A red dot  indicates there is something that needs your attention.  To record a symptom, click the circle on the monitor battery. In 30-60 seconds a list of symptoms will appear on the cell phone. Select your symptom and tap save. Your monitor will record a sustained or significant arrhythmia regardless of you clicking the button. Some patients do not feel the heart rhythm irregularities. [employer] will notify us  of any serious or critical events.  Refer to instruction booklet for instructions on switching batteries, changing strips, the Do not disturb or Pause features, or any additional questions.  Call [employer] at [phone], to confirm your monitor is transmitting and record your baseline. They will answer any questions you may have regarding the monitor instructions at that time.  Returning the monitor to [employer]  Place all equipment back into blue box. Peel off strip of paper to expose adhesive and close box securely. There is a prepaid UPS shipping label on this box. Drop in a UPS drop box, or at a UPS facility like [employer]. You may also contact [employer] to arrange UPS to pick up monitor package at your home.   Follow-Up: At [facility], you and your health needs  are our priority.  As part of our continuing mission to provide you with exceptional heart care, our providers are all part of one team.  This team includes your primary Cardiologist (physician) and Advanced Practice Providers or APPs (Physician Assistants and Nurse Practitioners) who all work together to provide you with the care you need, when you need it.  Your next appointment:   2 week(s)  Provider:   One of our Advanced Practice Providers (APPs): Melita Springer, PA-C  Friddie Jetty, NP Evaline Hill, NP  Theotis Flake, PA-C Lawana Pray, NP  Willis Harter, PA-C Lovette Rud, PA-C  [place], [place] Charles Connor, NP  Marlana Silvan, NP Marcie Sever, PA-C  Laquita Plant, PA-C    Dayna Dunn, PA-C  Marlyse Single, PA-C Palmer Bobo, NP Katlyn West, NP Callie Goodrich, PA-C  Evan Williams, PA-C Sheng Haley, PA-C  Xika Zhao, NP Kathleen Johnson, PA-C

## 2024-01-06 LAB — COMPREHENSIVE METABOLIC PANEL WITH GFR
ALT: 78 IU/L — ABNORMAL HIGH (ref 0–32)
AST: 67 IU/L — ABNORMAL HIGH (ref 0–40)
Albumin: 4.1 g/dL (ref 3.8–4.9)
Alkaline Phosphatase: 218 IU/L — ABNORMAL HIGH (ref 44–121)
BUN/Creatinine Ratio: 16 (ref 9–23)
BUN: 18 mg/dL (ref 6–24)
Bilirubin Total: 0.5 mg/dL (ref 0.0–1.2)
CO2: 22 mmol/L (ref 20–29)
Calcium: 9.2 mg/dL (ref 8.7–10.2)
Chloride: 110 mmol/L — ABNORMAL HIGH (ref 96–106)
Creatinine, Ser: 1.12 mg/dL — ABNORMAL HIGH (ref 0.57–1.00)
Globulin, Total: 2.7 g/dL (ref 1.5–4.5)
Glucose: 96 mg/dL (ref 70–99)
Potassium: 4.4 mmol/L (ref 3.5–5.2)
Sodium: 144 mmol/L (ref 134–144)
Total Protein: 6.8 g/dL (ref 6.0–8.5)
eGFR: 57 mL/min/{1.73_m2} — ABNORMAL LOW (ref >59)

## 2024-01-06 LAB — CBC
Hematocrit: 41.9 % (ref 34.0–46.6)
Hemoglobin: 13.9 g/dL (ref 11.1–15.9)
MCH: 31.6 pg (ref 26.6–33.0)
MCHC: 33.2 g/dL (ref 31.5–35.7)
MCV: 95 fL (ref 79–97)
Platelets: 182 10*3/uL (ref 150–450)
RBC: 4.4 x10E6/uL (ref 3.77–5.28)
RDW: 12.6 % (ref 11.7–15.4)
WBC: 12.2 10*3/uL — ABNORMAL HIGH (ref 3.4–10.8)

## 2024-01-06 LAB — MAGNESIUM: Magnesium: 2.1 mg/dL (ref 1.6–2.3)

## 2024-01-07 ENCOUNTER — Encounter: Payer: Self-pay | Admitting: Cardiology

## 2024-01-10 DIAGNOSIS — J4540 Moderate persistent asthma, uncomplicated: Secondary | ICD-10-CM | POA: Diagnosis not present

## 2024-01-11 ENCOUNTER — Telehealth: Payer: Self-pay | Admitting: *Deleted

## 2024-01-11 ENCOUNTER — Encounter: Payer: Self-pay | Admitting: Cardiology

## 2024-01-11 NOTE — Telephone Encounter (Signed)
No answer, voicemail message. 

## 2024-01-11 NOTE — Telephone Encounter (Signed)
 Reviewed procedure instructions with patient.

## 2024-01-11 NOTE — Telephone Encounter (Addendum)
 Cardiac Catheterization scheduled at [facility] for: Tuesday Jan 12, 2024 7:30 AM Arrival time [facility] Main Entrance A at: 5:30 AM  Nothing to eat after midnight prior to procedure, clear liquids until 5 AM day of procedure.  Medication instructions: -Hold:  Torsemide -day before and day of cath -per protocol GFR <60 (57)  Meloxicam -day before and day of cath-per protocol GFR <60 (57)  Semaglutide  weekly-pt reports she has not taken in about 1 month -Other usual morning medications can be taken with sips of water including aspirin 81 mg.  Plan to go home the same day, you will only stay overnight if medically necessary.  You must have responsible adult to drive you home.  Someone must be with you the first 24 hours after you arrive home.  Left message for patient to call back to review procedure instructions.

## 2024-01-12 ENCOUNTER — Encounter (HOSPITAL_COMMUNITY): Payer: Self-pay | Admitting: Cardiovascular Disease

## 2024-01-12 ENCOUNTER — Other Ambulatory Visit: Payer: Self-pay | Admitting: Physician Assistant

## 2024-01-12 ENCOUNTER — Other Ambulatory Visit: Payer: Self-pay

## 2024-01-12 ENCOUNTER — Ambulatory Visit (HOSPITAL_COMMUNITY)
Admission: RE | Admit: 2024-01-12 | Discharge: 2024-01-12 | Disposition: A | Discharge status: Home / Self Care | Attending: Cardiovascular Disease | Admitting: Cardiovascular Disease

## 2024-01-12 ENCOUNTER — Encounter (HOSPITAL_COMMUNITY)
Admission: RE | Disposition: A | Discharge status: Home / Self Care | Payer: Self-pay | Source: Home / Self Care | Attending: Cardiovascular Disease

## 2024-01-12 DIAGNOSIS — N189 Chronic kidney disease, unspecified: Secondary | ICD-10-CM | POA: Insufficient documentation

## 2024-01-12 DIAGNOSIS — Z6841 Body Mass Index (BMI) 40.0 and over, adult: Secondary | ICD-10-CM | POA: Diagnosis not present

## 2024-01-12 DIAGNOSIS — E785 Hyperlipidemia, unspecified: Secondary | ICD-10-CM | POA: Insufficient documentation

## 2024-01-12 DIAGNOSIS — I2510 Atherosclerotic heart disease of native coronary artery without angina pectoris: Secondary | ICD-10-CM | POA: Diagnosis not present

## 2024-01-12 DIAGNOSIS — I130 Hypertensive heart and chronic kidney disease with heart failure and stage 1 through stage 4 chronic kidney disease, or unspecified chronic kidney disease: Secondary | ICD-10-CM | POA: Insufficient documentation

## 2024-01-12 DIAGNOSIS — R0602 Shortness of breath: Secondary | ICD-10-CM

## 2024-01-12 DIAGNOSIS — Z87891 Personal history of nicotine dependence: Secondary | ICD-10-CM | POA: Diagnosis not present

## 2024-01-12 DIAGNOSIS — I5032 Chronic diastolic (congestive) heart failure: Secondary | ICD-10-CM | POA: Insufficient documentation

## 2024-01-12 DIAGNOSIS — Z79899 Other long term (current) drug therapy: Secondary | ICD-10-CM | POA: Insufficient documentation

## 2024-01-12 DIAGNOSIS — G4733 Obstructive sleep apnea (adult) (pediatric): Secondary | ICD-10-CM | POA: Insufficient documentation

## 2024-01-12 DIAGNOSIS — E559 Vitamin D deficiency, unspecified: Secondary | ICD-10-CM

## 2024-01-12 HISTORY — PX: RIGHT/LEFT HEART CATH AND CORONARY ANGIOGRAPHY: CATH118266

## 2024-01-12 LAB — POCT I-STAT 7, (LYTES, BLD GAS, ICA,H+H)
Acid-base deficit: 6 mmol/L — ABNORMAL HIGH (ref 0.0–2.0)
Bicarbonate: 18.5 mmol/L — ABNORMAL LOW (ref 20.0–28.0)
Calcium, Ion: 1.25 mmol/L (ref 1.15–1.40)
HCT: 38 % (ref 36.0–46.0)
Hemoglobin: 12.9 g/dL (ref 12.0–15.0)
O2 Saturation: 89 %
Potassium: 3.8 mmol/L (ref 3.5–5.1)
Sodium: 145 mmol/L (ref 135–145)
TCO2: 19 mmol/L — ABNORMAL LOW (ref 22–32)
pCO2 arterial: 34.3 mmHg (ref 32–48)
pH, Arterial: 7.34 — ABNORMAL LOW (ref 7.35–7.45)
pO2, Arterial: 60 mmHg — ABNORMAL LOW (ref 83–108)

## 2024-01-12 LAB — POCT I-STAT EG7
Acid-base deficit: 5 mmol/L — ABNORMAL HIGH (ref 0.0–2.0)
Bicarbonate: 20.6 mmol/L (ref 20.0–28.0)
Calcium, Ion: 1.23 mmol/L (ref 1.15–1.40)
HCT: 38 % (ref 36.0–46.0)
Hemoglobin: 12.9 g/dL (ref 12.0–15.0)
O2 Saturation: 61 %
Potassium: 3.8 mmol/L (ref 3.5–5.1)
Sodium: 145 mmol/L (ref 135–145)
TCO2: 22 mmol/L (ref 22–32)
pCO2, Ven: 40.5 mmHg — ABNORMAL LOW (ref 44–60)
pH, Ven: 7.313 (ref 7.25–7.43)
pO2, Ven: 35 mmHg (ref 32–45)

## 2024-01-12 SURGERY — RIGHT/LEFT HEART CATH AND CORONARY ANGIOGRAPHY
Anesthesia: LOCAL

## 2024-01-12 MED ORDER — IOHEXOL 350 MG/ML SOLN
INTRAVENOUS | Status: DC | PRN
  Administered 2024-01-12: 22 mL

## 2024-01-12 MED ORDER — SODIUM CHLORIDE 0.9 % WEIGHT BASED INFUSION: 1.0000 mL/kg/h | INTRAVENOUS | Status: DC

## 2024-01-12 MED ORDER — SODIUM CHLORIDE 0.9 % IV SOLN: 250.0000 mL | INTRAVENOUS | Status: DC | PRN

## 2024-01-12 MED ORDER — LIDOCAINE HCL (PF) 1 % IJ SOLN
INTRAMUSCULAR | Status: DC | PRN
  Administered 2024-01-12: 2 mL

## 2024-01-12 MED ORDER — SODIUM CHLORIDE 0.9 % WEIGHT BASED INFUSION
3.0000 mL/kg/h | INTRAVENOUS | Status: AC
Start: 2024-01-12 — End: 2024-01-12

## 2024-01-12 MED ORDER — ONDANSETRON HCL 4 MG/2ML IJ SOLN: 4.0000 mg | Freq: Four times a day (QID) | INTRAMUSCULAR | Status: DC | PRN

## 2024-01-12 MED ORDER — VERAPAMIL HCL 2.5 MG/ML IV SOLN
INTRAVENOUS | Status: DC | PRN
  Administered 2024-01-12: 10 mL via INTRA_ARTERIAL

## 2024-01-12 MED ORDER — HEPARIN (PORCINE) IN NACL 2000-0.9 UNIT/L-% IV SOLN
INTRAVENOUS | Status: DC | PRN
  Administered 2024-01-12 (×2): 1000 mL

## 2024-01-12 MED ORDER — MIDAZOLAM HCL 2 MG/2ML IJ SOLN
INTRAMUSCULAR | Status: AC
  Filled 2024-01-12: qty 2

## 2024-01-12 MED ORDER — ACETAMINOPHEN 325 MG PO TABS: 650.0000 mg | ORAL_TABLET | ORAL | Status: DC | PRN

## 2024-01-12 MED ORDER — HYDRALAZINE HCL 20 MG/ML IJ SOLN: 10.0000 mg | INTRAMUSCULAR | Status: DC | PRN

## 2024-01-12 MED ORDER — SODIUM CHLORIDE 0.9% FLUSH: 3.0000 mL | INTRAVENOUS | Status: DC | PRN

## 2024-01-12 MED ORDER — SODIUM CHLORIDE 0.9 % IV SOLN: INTRAVENOUS | Status: AC

## 2024-01-12 MED ORDER — HEPARIN SODIUM (PORCINE) 1000 UNIT/ML IJ SOLN
INTRAMUSCULAR | Status: DC | PRN
  Administered 2024-01-12: 5000 [IU] via INTRAVENOUS

## 2024-01-12 MED ORDER — FENTANYL CITRATE (PF) 100 MCG/2ML IJ SOLN
INTRAMUSCULAR | Status: AC
Start: 2024-01-12 — End: ?
  Filled 2024-01-12: qty 2

## 2024-01-12 MED ORDER — FENTANYL CITRATE (PF) 100 MCG/2ML IJ SOLN
INTRAMUSCULAR | Status: DC | PRN
  Administered 2024-01-12: 25 ug via INTRAVENOUS

## 2024-01-12 MED ORDER — LABETALOL HCL 5 MG/ML IV SOLN: 10.0000 mg | INTRAVENOUS | Status: DC | PRN

## 2024-01-12 MED ORDER — LIDOCAINE HCL (PF) 1 % IJ SOLN
INTRAMUSCULAR | Status: AC
  Filled 2024-01-12: qty 30

## 2024-01-12 MED ORDER — MIDAZOLAM HCL 2 MG/2ML IJ SOLN
INTRAMUSCULAR | Status: DC | PRN
  Administered 2024-01-12: 1 mg via INTRAVENOUS

## 2024-01-12 MED ORDER — SODIUM CHLORIDE 0.9% FLUSH: 3.0000 mL | Freq: Two times a day (BID) | INTRAVENOUS | Status: DC

## 2024-01-12 MED ORDER — ASPIRIN 81 MG PO CHEW: 81.0000 mg | CHEWABLE_TABLET | ORAL | Status: DC

## 2024-01-12 MED ORDER — HEPARIN SODIUM (PORCINE) 1000 UNIT/ML IJ SOLN
INTRAMUSCULAR | Status: AC
  Filled 2024-01-12: qty 10

## 2024-01-12 MED ORDER — VERAPAMIL HCL 2.5 MG/ML IV SOLN
INTRAVENOUS | Status: AC
  Filled 2024-01-12: qty 2

## 2024-01-12 SURGICAL SUPPLY — 10 items
CATH BALLN WEDGE 5F 110CM (CATHETERS) IMPLANT
CATH INFINITI 5 FR JL3.5 (CATHETERS) IMPLANT
CATH INFINITI JR4 5F (CATHETERS) IMPLANT
DEVICE RAD COMP TR BAND LRG (VASCULAR PRODUCTS) IMPLANT
GLIDESHEATH SLEND SS 6F .021 (SHEATH) IMPLANT
GUIDEWIRE INQWIRE 1.5J.035X260 (WIRE) IMPLANT
KIT SYRINGE INJ CVI SPIKEX1 (MISCELLANEOUS) IMPLANT
PACK CARDIAC CATHETERIZATION (CUSTOM PROCEDURE TRAY) ×1 IMPLANT
SET ATX-X65L (MISCELLANEOUS) IMPLANT
SHEATH GLIDE SLENDER 4/5FR (SHEATH) IMPLANT

## 2024-01-12 NOTE — Interval H&P Note (Signed)
 History and Physical Interval Note:  01/12/2024 8:02 AM  Cindy Hammond  has presented today for surgery, with the diagnosis of DOE.  The various methods of treatment have been discussed with the patient and family. After consideration of risks, benefits and other options for treatment, the patient has consented to  Procedure(s): RIGHT/LEFT HEART CATH AND CORONARY ANGIOGRAPHY (N/A) as a surgical intervention.  The patient's history has been reviewed, patient examined, no change in status, stable for surgery.  I have reviewed the patient's chart and labs.  Questions were answered to the patient's satisfaction.    Cath Lab Visit (complete for each Cath Lab visit)  Clinical Evaluation Leading to the Procedure:   ACS: No.  Non-ACS:    Anginal Classification: CCS III  Anti-ischemic medical therapy: Maximal Therapy (2 or more classes of medications)  Non-Invasive Test Results: No non-invasive testing performed  Prior CABG: No previous CABG    Antoinette Batman

## 2024-01-13 ENCOUNTER — Encounter: Payer: Self-pay | Admitting: Cardiology

## 2024-01-21 ENCOUNTER — Encounter: Payer: Self-pay | Admitting: Physician Assistant

## 2024-01-21 ENCOUNTER — Ambulatory Visit: Admitting: Physician Assistant

## 2024-01-21 VITALS — BP 110/86 | HR 85 | Temp 98.2°F | Resp 18 | Ht 60.0 in | Wt 242.0 lb

## 2024-01-21 DIAGNOSIS — N3001 Acute cystitis with hematuria: Secondary | ICD-10-CM

## 2024-01-21 DIAGNOSIS — R0609 Other forms of dyspnea: Secondary | ICD-10-CM | POA: Diagnosis not present

## 2024-01-21 DIAGNOSIS — R42 Dizziness and giddiness: Secondary | ICD-10-CM

## 2024-01-21 LAB — POCT URINALYSIS DIP (CLINITEK)
Bilirubin, UA: NEGATIVE
Glucose, UA: NEGATIVE mg/dL
Ketones, POC UA: NEGATIVE mg/dL
Nitrite, UA: NEGATIVE
POC PROTEIN,UA: NEGATIVE
Spec Grav, UA: 1.01 (ref 1.010–1.025)
Urobilinogen, UA: 0.2 U/dL
pH, UA: 6.5 (ref 5.0–8.0)

## 2024-01-21 MED ORDER — DOXYCYCLINE HYCLATE 100 MG PO TABS: 100.0000 mg | ORAL_TABLET | Freq: Two times a day (BID) | ORAL | 0 refills | Status: DC

## 2024-01-21 NOTE — Progress Notes (Signed)
 Subjective:  Patient ID: Cindy Hammond, female    DOB: 07/23/1966  Age: 58 y.o. MRN: [id number]  Chief Complaint  Patient presents with   Hematuria    HPI  Pt in today for follow up of uti - she states she is still having urine frequency and urgency after completing antibiotics - denies fever, nausea or back pain  Pt states over the past few weeks she has had episodes of dizziness and weakness. She is currently following with cardiology (Dr Emmette Harms) and just had zio patch put on yesterday that she will wear for a month.  She also had a heart catheterization on 01/12/24 which she states was normal except for a noted 20% blockage.      12/15/2023    8:10 AM 08/12/2023    9:24 AM 05/14/2023    9:23 AM 04/07/2023    1:29 PM 11/11/2022   10:03 AM  Depression screen PHQ 2/9  Decreased Interest 0 1 1 0 3  Down, Depressed, Hopeless 0 1 1 1 3   PHQ - 2 Score 0 2 2 1 6   Altered sleeping  0 0 0 2  Tired, decreased energy  1 1 1 3   Change in appetite  1 0 1 0  Feeling bad or failure about yourself   0 0 0 0  Trouble concentrating  0 0 0 0  Moving slowly or fidgety/restless  0 0 0 0  Suicidal thoughts  0 0 0 0  PHQ-9 Score  4 3 3 11   Difficult doing work/chores  Not difficult at all   Somewhat difficult        08/06/2022    9:17 AM 11/11/2022   10:03 AM 04/07/2023    1:29 PM 08/12/2023    9:24 AM 12/15/2023    8:10 AM  Fall Risk  Falls in the past year? 1 0 1 0 0  Was there an injury with Fall? 0 0 0 0 0  Fall Risk Category Calculator 1 0 1 0 0  Fall Risk Category (Retired) Low      (RETIRED) Patient Fall Risk Level Low fall risk      Patient at Risk for Falls Due to No Fall Risks No Fall Risks No Fall Risks No Fall Risks No Fall Risks  Fall risk Follow up Falls evaluation completed Falls evaluation completed Falls evaluation completed Falls evaluation completed      ROS CONSTITUTIONAL: Negative for chills, fatigue, fever,  E/N/T: Negative for ear pain, nasal congestion and sore throat.   CARDIOVASCULAR: see HPI RESPIRATORY: Negative for recent cough and dyspnea.  GASTROINTESTINAL: Negative for abdominal pain, acid reflux symptoms, constipation, diarrhea, nausea and vomiting.  GU - see HPI PSYCHIATRIC: Negative for sleep disturbance and to question depression screen.  Negative for depression, negative for anhedonia.    Current Outpatient Medications:    albuterol  (PROVENTIL ) (2.5 MG/3ML) 0.083% nebulizer solution, Take 2.5 mg by nebulization every 4 (four) hours as needed for wheezing or shortness of breath., Disp: , Rfl:    albuterol  (VENTOLIN  HFA) 108 (90 Base) MCG/ACT inhaler, INHALE 1-2 PUFFS INTO THE LUNGS EVERY 6 HOURS AS NEEDED FOR WHEEZING OR SHORTNESS OF BREATH, Disp: 8.5 g, Rfl: 2   amitriptyline  (ELAVIL ) 150 MG tablet, TAKE 1 TABLET BY MOUTH DAILY AT BEDTIME., Disp: 90 tablet, Rfl: 0   atenolol  (TENORMIN ) 25 MG tablet, Take 1 tablet (25 mg total) by mouth 2 (two) times daily., Disp: 180 tablet, Rfl: 2   atorvastatin  (LIPITOR) 80 MG tablet,  Take 1 tablet by mouth ONCE daily., Disp: 90 tablet, Rfl: 1   clonazePAM  (KLONOPIN ) 0.5 MG tablet, TAKE ONE TABLET BY MOUTH TWICE DAILY, Disp: 60 tablet, Rfl: 1   doxycycline  (VIBRA -TABS) 100 MG tablet, Take 1 tablet (100 mg total) by mouth 2 (two) times daily., Disp: 20 tablet, Rfl: 0   fenofibrate  (TRICOR ) 48 MG tablet, Take 1 tablet (48 mg total) by mouth daily., Disp: 90 tablet, Rfl: 3   KRILL OIL PO, Take 1 capsule by mouth daily., Disp: , Rfl:    meloxicam  (MOBIC ) 15 MG tablet, Take 1 tablet (15 mg total) by mouth daily., Disp: 30 tablet, Rfl: 2   montelukast (SINGULAIR) 10 MG tablet, Take 10 mg by mouth daily., Disp: , Rfl:    nitroGLYCERIN  (NITROSTAT ) 0.4 MG SL tablet, PLACE 1 TABLET UNDER THE TONGUE AS NEEDED FOR CHEST PAIN ( MAY REPEAT EVERY5 MINUTES X 3), Disp: 25 tablet, Rfl: 3   omeprazole  (PRILOSEC) 40 MG capsule, Take 1 capsule by mouth once daily. (Patient taking differently: Take 40 mg by mouth daily as needed  (Heartburn).), Disp: 90 capsule, Rfl: 1   OXYGEN , Inhale 1 L into the lungs at bedtime., Disp: , Rfl:    phenazopyridine  (PYRIDIUM ) 200 MG tablet, Take 1 tablet (200 mg total) by mouth 3 (three) times daily as needed for pain., Disp: 10 tablet, Rfl: 0   pregabalin  (LYRICA ) 75 MG capsule, Take 1 capsule by mouth 3 times daily., Disp: 270 capsule, Rfl: 0   QUEtiapine  (SEROQUEL ) 300 MG tablet, Take 1 tablet by mouth once daily. AT BEDTIME, Disp: 90 tablet, Rfl: 1   ranolazine  (RANEXA ) 500 MG 12 hr tablet, Take 1 tablet by mouth twice daily. (Patient taking differently: Take 500 mg by mouth at bedtime.), Disp: 60 tablet, Rfl: 11   SYMBICORT 160-4.5 MCG/ACT inhaler, Inhale 2 puffs into the lungs 2 (two) times daily., Disp: , Rfl:    topiramate  (TOPAMAX ) 100 MG tablet, Take 1 tablet by mouth 3 times daily. (Patient taking differently: Take 100 mg by mouth 2 (two) times daily.), Disp: 270 tablet, Rfl: 0   torsemide  (DEMADEX ) 20 MG tablet, Take 1 tablet (20 mg total) by mouth daily., Disp: 90 tablet, Rfl: 3   Ubrogepant  (UBRELVY ) 100 MG TABS, Take 100 mg by mouth 2 (two) times daily as needed (migraine)., Disp: , Rfl:    Vitamin D , Ergocalciferol , (DRISDOL ) 1.25 MG (50000 UNIT) CAPS capsule, Take 1 capsule by mouth twice a week as directed, Disp: 10 capsule, Rfl: 1  Past Medical History:  Diagnosis Date   Anxiety    Depression    GERD (gastroesophageal reflux disease)    Hyperlipidemia    Hypertension    Pain in joint, lower leg    Thrombocytopenia (HCC)    Objective:  PHYSICAL EXAM:   BP 110/86   Pulse 85   Temp 98.2 F (36.8 C) (Temporal)   Resp 18   Ht 5' (1.524 m)   Wt 242 lb (109.8 kg)   LMP  (LMP Unknown)   SpO2 97%   BMI 47.26 kg/m    GEN: Well nourished, well developed, in no acute distress   Cardiac: RRR; no murmurs, rubs, or gallops,no edema -  Respiratory:  normal respiratory rate and pattern with no distress - normal breath sounds with no rales, rhonchi, wheezes or  rubs  Skin: warm and dry, no rash  Neuro:  Alert and Oriented x 3,  - CN II-Xii grossly intact Psych: euthymic mood, appropriate affect and  demeanor  Office Visit on 01/21/2024  Component Date Value Ref Range Status   Color, UA 01/21/2024 yellow  yellow Final   Clarity, UA 01/21/2024 cloudy (A)  clear Final   Glucose, UA 01/21/2024 negative  negative mg/dL Final   Bilirubin, UA 11/91/4782 negative  negative Final   Ketones, POC UA 01/21/2024 negative  negative mg/dL Final   Spec Grav, UA 95/62/1308 1.010  1.010 - 1.025 Final   Blood, UA 01/21/2024 trace-intact (A)  negative Final   pH, UA 01/21/2024 6.5  5.0 - 8.0 Final   POC PROTEIN,UA 01/21/2024 negative  negative, trace Final   Urobilinogen, UA 01/21/2024 0.2  0.2 or 1.0 E.U./dL Final   Nitrite, UA 65/78/4696 Negative  Negative Final   Leukocytes, UA 01/21/2024 Large (3+) (A)  Negative Final    Assessment & Plan:    Acute hemorrhagic cystitis -     POCT URINALYSIS DIP (CLINITEK) -     Urine Culture -     Doxycycline  Hyclate; Take 1 tablet (100 mg total) by mouth 2 (two) times daily.  Dispense: 20 tablet; Refill: 0  Dizziness -     CBC with Differential/Platelet -     Comprehensive metabolic panel with GFR  Exertional dyspnea Follow up with cardiology as directed    Follow-up: Return for as scheduled for next chronic visit.  An After Visit Summary was printed and given to the patient.  Anthonette Bastos [facility] [phone]

## 2024-01-22 ENCOUNTER — Ambulatory Visit: Payer: Self-pay | Admitting: Physician Assistant

## 2024-01-22 ENCOUNTER — Other Ambulatory Visit: Payer: Self-pay | Admitting: Physician Assistant

## 2024-01-22 ENCOUNTER — Telehealth: Payer: Self-pay | Admitting: Cardiology

## 2024-01-22 DIAGNOSIS — R899 Unspecified abnormal finding in specimens from other organs, systems and tissues: Secondary | ICD-10-CM

## 2024-01-22 LAB — COMPREHENSIVE METABOLIC PANEL WITH GFR
ALT: 153 IU/L — ABNORMAL HIGH (ref 0–32)
AST: 148 IU/L — ABNORMAL HIGH (ref 0–40)
Albumin: 4.1 g/dL (ref 3.8–4.9)
Alkaline Phosphatase: 202 IU/L — ABNORMAL HIGH (ref 44–121)
BUN/Creatinine Ratio: 14 (ref 9–23)
BUN: 16 mg/dL (ref 6–24)
Bilirubin Total: 0.6 mg/dL (ref 0.0–1.2)
CO2: 22 mmol/L (ref 20–29)
Calcium: 9.4 mg/dL (ref 8.7–10.2)
Chloride: 107 mmol/L — ABNORMAL HIGH (ref 96–106)
Creatinine, Ser: 1.15 mg/dL — ABNORMAL HIGH (ref 0.57–1.00)
Globulin, Total: 2.9 g/dL (ref 1.5–4.5)
Glucose: 103 mg/dL — ABNORMAL HIGH (ref 70–99)
Potassium: 4.2 mmol/L (ref 3.5–5.2)
Sodium: 143 mmol/L (ref 134–144)
Total Protein: 7 g/dL (ref 6.0–8.5)
eGFR: 56 mL/min/{1.73_m2} — ABNORMAL LOW (ref >59)

## 2024-01-22 LAB — CBC WITH DIFFERENTIAL/PLATELET
Basophils Absolute: 0.1 10*3/uL (ref 0.0–0.2)
Basos: 1 %
EOS (ABSOLUTE): 1 10*3/uL — ABNORMAL HIGH (ref 0.0–0.4)
Eos: 12 %
Hematocrit: 43.1 % (ref 34.0–46.6)
Hemoglobin: 13.9 g/dL (ref 11.1–15.9)
Immature Grans (Abs): 0.1 10*3/uL (ref 0.0–0.1)
Immature Granulocytes: 1 %
Lymphocytes Absolute: 1.7 10*3/uL (ref 0.7–3.1)
Lymphs: 21 %
MCH: 30.8 pg (ref 26.6–33.0)
MCHC: 32.3 g/dL (ref 31.5–35.7)
MCV: 95 fL (ref 79–97)
Monocytes Absolute: 0.8 10*3/uL (ref 0.1–0.9)
Monocytes: 10 %
Neutrophils Absolute: 4.6 10*3/uL (ref 1.4–7.0)
Neutrophils: 55 %
Platelets: 237 10*3/uL (ref 150–450)
RBC: 4.52 x10E6/uL (ref 3.77–5.28)
RDW: 12.7 % (ref 11.7–15.4)
WBC: 8.3 10*3/uL (ref 3.4–10.8)

## 2024-01-22 LAB — URINE CULTURE

## 2024-01-22 NOTE — Addendum Note (Signed)
 Addended by: Zeb Heys on: 01/22/2024 10:31 AM   Modules accepted: Orders

## 2024-01-22 NOTE — Telephone Encounter (Signed)
 Pt called in stating the referral that was sent was to a nephrology place in [place], [place]. She would like it sent locally in [place]. Please advise.

## 2024-01-22 NOTE — Telephone Encounter (Signed)
 Patient identification verified by 2 forms. Sims Duck, RN     Called patient. No answer. LVMTCB               Interventions/Plan: - Amb Referral corrected for [facility] at [address] In [place], Marveen Slick  -LDVM with [facility] phone number and address for pt to f/u on referral as well.   Patient agrees with plan, no questions at this time

## 2024-01-25 ENCOUNTER — Encounter: Payer: Self-pay | Admitting: Physician Assistant

## 2024-01-26 ENCOUNTER — Other Ambulatory Visit

## 2024-01-26 DIAGNOSIS — R899 Unspecified abnormal finding in specimens from other organs, systems and tissues: Secondary | ICD-10-CM

## 2024-01-26 DIAGNOSIS — J4540 Moderate persistent asthma, uncomplicated: Secondary | ICD-10-CM | POA: Diagnosis not present

## 2024-01-26 DIAGNOSIS — R5383 Other fatigue: Secondary | ICD-10-CM | POA: Diagnosis not present

## 2024-01-26 DIAGNOSIS — R0600 Dyspnea, unspecified: Secondary | ICD-10-CM | POA: Diagnosis not present

## 2024-01-26 DIAGNOSIS — G4736 Sleep related hypoventilation in conditions classified elsewhere: Secondary | ICD-10-CM | POA: Diagnosis not present

## 2024-01-26 NOTE — Telephone Encounter (Signed)
 Left voicemail informing patient that it is advised she come and be seen/worked in today.

## 2024-01-27 ENCOUNTER — Other Ambulatory Visit: Payer: Self-pay | Admitting: Family Medicine

## 2024-01-27 ENCOUNTER — Other Ambulatory Visit: Payer: Self-pay | Admitting: Cardiology

## 2024-01-27 DIAGNOSIS — R7401 Elevation of levels of liver transaminase levels: Secondary | ICD-10-CM

## 2024-01-28 ENCOUNTER — Telehealth: Payer: Self-pay

## 2024-01-28 NOTE — Telephone Encounter (Signed)
I left message on voicemail to call us back. 

## 2024-01-29 ENCOUNTER — Encounter: Payer: Self-pay | Admitting: Physician Assistant

## 2024-01-29 LAB — COMPREHENSIVE METABOLIC PANEL WITH GFR
ALT: 275 IU/L (ref 0–32)
AST: 252 IU/L (ref 0–40)
Albumin: 4.3 g/dL (ref 3.8–4.9)
Alkaline Phosphatase: 291 IU/L — ABNORMAL HIGH (ref 44–121)
BUN/Creatinine Ratio: 13 (ref 9–23)
BUN: 18 mg/dL (ref 6–24)
Bilirubin Total: 0.9 mg/dL (ref 0.0–1.2)
CO2: 19 mmol/L — ABNORMAL LOW (ref 20–29)
Calcium: 9.6 mg/dL (ref 8.7–10.2)
Chloride: 105 mmol/L (ref 96–106)
Creatinine, Ser: 1.4 mg/dL — ABNORMAL HIGH (ref 0.57–1.00)
Globulin, Total: 3.4 g/dL (ref 1.5–4.5)
Glucose: 90 mg/dL (ref 70–99)
Potassium: 4 mmol/L (ref 3.5–5.2)
Sodium: 144 mmol/L (ref 134–144)
Total Protein: 7.7 g/dL (ref 6.0–8.5)
eGFR: 44 mL/min/{1.73_m2} — ABNORMAL LOW (ref >59)

## 2024-01-29 LAB — HEPB+HEPC+HIV PANEL
HIV Screen 4th Generation wRfx: NONREACTIVE
Hep B C IgM: NEGATIVE
Hep B Core Total Ab: NEGATIVE
Hep B E Ab: NONREACTIVE
Hep B E Ag: NEGATIVE
Hep B Surface Ab, Qual: NONREACTIVE
Hep C Virus Ab: NONREACTIVE
Hepatitis B Surface Ag: NEGATIVE

## 2024-01-29 NOTE — Telephone Encounter (Signed)
 Spoke with patient, coming in Monday 06.02 to see Delford Felling, NP as Dwain Giovanni is out of the office and doesn't have anything available.

## 2024-01-31 ENCOUNTER — Ambulatory Visit: Payer: Self-pay | Admitting: Family Medicine

## 2024-01-31 ENCOUNTER — Encounter: Payer: Self-pay | Admitting: Cardiology

## 2024-02-01 ENCOUNTER — Ambulatory Visit: Admitting: Family Medicine

## 2024-02-01 VITALS — BP 102/66 | HR 76 | Temp 97.8°F | Resp 16 | Ht 60.0 in | Wt 239.2 lb

## 2024-02-01 DIAGNOSIS — N3000 Acute cystitis without hematuria: Secondary | ICD-10-CM

## 2024-02-01 DIAGNOSIS — R300 Dysuria: Secondary | ICD-10-CM

## 2024-02-01 DIAGNOSIS — R7401 Elevation of levels of liver transaminase levels: Secondary | ICD-10-CM | POA: Diagnosis not present

## 2024-02-01 DIAGNOSIS — K7581 Nonalcoholic steatohepatitis (NASH): Secondary | ICD-10-CM

## 2024-02-01 DIAGNOSIS — R748 Abnormal levels of other serum enzymes: Secondary | ICD-10-CM

## 2024-02-01 DIAGNOSIS — N1832 Chronic kidney disease, stage 3b: Secondary | ICD-10-CM

## 2024-02-01 HISTORY — DX: Dysuria: R30.0

## 2024-02-01 HISTORY — DX: Acute cystitis without hematuria: N30.00

## 2024-02-01 HISTORY — DX: Abnormal levels of other serum enzymes: R74.8

## 2024-02-01 HISTORY — DX: Elevation of levels of liver transaminase levels: R74.01

## 2024-02-01 LAB — POCT URINALYSIS DIP (CLINITEK)
Bilirubin, UA: NEGATIVE
Blood, UA: NEGATIVE
Glucose, UA: NEGATIVE mg/dL
Ketones, POC UA: NEGATIVE mg/dL
Nitrite, UA: NEGATIVE
Spec Grav, UA: 1.015 (ref 1.010–1.025)
Urobilinogen, UA: 0.2 U/dL
pH, UA: 6.5 (ref 5.0–8.0)

## 2024-02-01 MED ORDER — CIPROFLOXACIN HCL 250 MG PO TABS: 250.0000 mg | ORAL_TABLET | Freq: Two times a day (BID) | ORAL | 0 refills | Status: AC

## 2024-02-01 NOTE — Assessment & Plan Note (Signed)
 UTI - positive - send urine culture - Cipro  250 mg by mouth TWICE A DAY for 5 days

## 2024-02-01 NOTE — Assessment & Plan Note (Signed)
 Stage 3 CKD with solitary kidney and family history. Nephrology referral pending. - Confirm referral to [facility] and schedule appointment.

## 2024-02-01 NOTE — Progress Notes (Signed)
 Subjective:  Patient ID: Cindy Hammond, female    DOB: 02/04/1966  Age: 58 y.o. MRN: [id number]  Chief Complaint  Patient presents with   Medical Management of Chronic Issues    labs    Discussed the use of AI scribe software for clinical note transcription with the patient, who gave verbal consent to proceed.  History of Present Illness   Cindy Hammond is a 58 year old female with stage 3 kidney disease and NASH who presents with worsening kidney dysfunction and elevated liver enzymes.  She has a history of stage 3 kidney disease and only one kidney. Recently, she has experienced worsening kidney dysfunction. She has completed three rounds of antibiotics, which she has since discontinued. She is in the process of establishing care with a kidney specialist at the [facility] in [place], but is awaiting confirmation of her referral. Her father had chronic kidney disease.  She has been diagnosed with non-alcoholic steatohepatitis (NASH) since 2022. Her liver enzymes are currently elevated. She denies taking Tylenol .  She experiences burning during urination, which was last noted yesterday and slightly this morning. Her urine culture was previously negative, and she has stopped taking doxycycline .  She also experiences shortness of breath and has been undergoing tests for this issue. She mentions having been in another office where tests were conducted to check on her condition.          12/15/2023    8:10 AM 08/12/2023    9:24 AM 05/14/2023    9:23 AM 04/07/2023    1:29 PM 11/11/2022   10:03 AM  Depression screen PHQ 2/9  Decreased Interest 0 1 1 0 3  Down, Depressed, Hopeless 0 1 1 1 3   PHQ - 2 Score 0 2 2 1 6   Altered sleeping  0 0 0 2  Tired, decreased energy  1 1 1 3   Change in appetite  1 0 1 0  Feeling bad or failure about yourself   0 0 0 0  Trouble concentrating  0 0 0 0  Moving slowly or fidgety/restless  0 0 0 0  Suicidal thoughts  0 0 0 0  PHQ-9 Score  4  3 3 11   Difficult doing work/chores  Not difficult at all   Somewhat difficult        12/15/2023    8:10 AM  Fall Risk   Falls in the past year? 0  Number falls in past yr: 0  Injury with Fall? 0  Risk for fall due to : No Fall Risks    Patient Care Team: Cyndi Drain, PA-C as PCP - General (Physician Assistant) Tobb, Kardie, DO as PCP - Cardiology (Cardiology)   Review of Systems  Constitutional:  Negative for chills, diaphoresis, fatigue and fever.  HENT:  Negative for congestion, ear pain and sinus pain.   Eyes: Negative.   Respiratory:  Negative for cough, chest tightness, shortness of breath and wheezing.   Cardiovascular:  Negative for chest pain and palpitations.  Gastrointestinal:  Negative for abdominal pain, constipation, diarrhea, nausea and vomiting.  Endocrine: Negative.   Genitourinary:  Positive for dysuria. Negative for frequency and urgency.  Musculoskeletal:  Negative for arthralgias.  Skin: Negative.   Allergic/Immunologic: Negative.   Neurological:  Negative for weakness and headaches.  Hematological: Negative.   Psychiatric/Behavioral:  Negative for dysphoric mood. The patient is not nervous/anxious.     Current Outpatient Medications on File Prior to Visit  Medication Sig Dispense Refill  albuterol  (PROVENTIL ) (2.5 MG/3ML) 0.083% nebulizer solution Take 2.5 mg by nebulization every 4 (four) hours as needed for wheezing or shortness of breath.     albuterol  (VENTOLIN  HFA) 108 (90 Base) MCG/ACT inhaler INHALE 1-2 PUFFS INTO THE LUNGS EVERY 6 HOURS AS NEEDED FOR WHEEZING OR SHORTNESS OF BREATH 8.5 g 2   amitriptyline  (ELAVIL ) 150 MG tablet TAKE 1 TABLET BY MOUTH DAILY AT BEDTIME. 90 tablet 0   atenolol  (TENORMIN ) 25 MG tablet Take 1 tablet (25 mg total) by mouth 2 (two) times daily. 180 tablet 2   atorvastatin  (LIPITOR) 80 MG tablet Take 1 tablet by mouth ONCE daily. 90 tablet 1   clonazePAM  (KLONOPIN ) 0.5 MG tablet TAKE ONE TABLET BY MOUTH TWICE DAILY 60  tablet 1   doxycycline  (VIBRA -TABS) 100 MG tablet Take 1 tablet (100 mg total) by mouth 2 (two) times daily. 20 tablet 0   fenofibrate  (TRICOR ) 48 MG tablet Take 1 tablet (48 mg total) by mouth daily. 90 tablet 3   KRILL OIL PO Take 1 capsule by mouth daily.     meloxicam  (MOBIC ) 15 MG tablet Take 1 tablet (15 mg total) by mouth daily. 30 tablet 2   montelukast (SINGULAIR) 10 MG tablet Take 10 mg by mouth daily.     nitroGLYCERIN  (NITROSTAT ) 0.4 MG SL tablet PLACE 1 TABLET UNDER THE TONGUE AS NEEDED FOR CHEST PAIN ( MAY REPEAT EVERY5 MINUTES X 3) 25 tablet 3   omeprazole  (PRILOSEC) 40 MG capsule Take 1 capsule by mouth once daily. (Patient taking differently: Take 40 mg by mouth daily as needed (Heartburn).) 90 capsule 1   OXYGEN  Inhale 1 L into the lungs at bedtime.     phenazopyridine  (PYRIDIUM ) 200 MG tablet Take 1 tablet (200 mg total) by mouth 3 (three) times daily as needed for pain. 10 tablet 0   pregabalin  (LYRICA ) 75 MG capsule Take 1 capsule by mouth 3 times daily. 270 capsule 0   QUEtiapine  (SEROQUEL ) 300 MG tablet Take 1 tablet by mouth once daily. AT BEDTIME 90 tablet 1   ranolazine  (RANEXA ) 500 MG 12 hr tablet Take 1 tablet by mouth twice daily. (Patient taking differently: Take 500 mg by mouth at bedtime.) 60 tablet 11   SYMBICORT 160-4.5 MCG/ACT inhaler Inhale 2 puffs into the lungs 2 (two) times daily.     topiramate  (TOPAMAX ) 100 MG tablet Take 1 tablet by mouth 3 times daily. (Patient taking differently: Take 100 mg by mouth 2 (two) times daily.) 270 tablet 0   torsemide  (DEMADEX ) 20 MG tablet Take 1 tablet (20 mg total) by mouth daily. 90 tablet 3   Ubrogepant  (UBRELVY ) 100 MG TABS Take 100 mg by mouth 2 (two) times daily as needed (migraine).     Vitamin D , Ergocalciferol , (DRISDOL ) 1.25 MG (50000 UNIT) CAPS capsule Take 1 capsule by mouth twice a week as directed 10 capsule 1   No current facility-administered medications on file prior to visit.   Past Medical History:   Diagnosis Date   Anxiety    Depression    GERD (gastroesophageal reflux disease)    Hyperlipidemia    Hypertension    Pain in joint, lower leg    Thrombocytopenia (HCC)    Past Surgical History:  Procedure Laterality Date   RIGHT/LEFT HEART CATH AND CORONARY ANGIOGRAPHY N/A 01/12/2024   Procedure: RIGHT/LEFT HEART CATH AND CORONARY ANGIOGRAPHY;  Surgeon: Odie Benne, MD;  Location: [facility];  Service: Cardiovascular;  Laterality: N/A;   TUBAL  LIGATION      Family History  Problem Relation Age of Onset   Hyperlipidemia Mother    Hypertension Mother    Hyperlipidemia Father    Hypertension Father    Social History   Socioeconomic History   Marital status: Married    Spouse name: Not on file   Number of children: 3   Years of education: Not on file   Highest education level: GED or equivalent  Occupational History   Occupation: Unemployed  Tobacco Use   Smoking status: Former    Current packs/day: 0.00    Average packs/day: 0.5 packs/day for 10.0 years (5.0 ttl pk-yrs)    Types: Cigarettes    Start date: 9    Quit date: 2000    Years since quitting: 25.4   Smokeless tobacco: Never  Vaping Use   Vaping status: Never Used  Substance and Sexual Activity   Alcohol use: No    Alcohol/week: 0.0 standard drinks of alcohol   Drug use: No   Sexual activity: Yes    Partners: Male  Other Topics Concern   Not on file  Social History Narrative   Not on file   Social Drivers of Health   Financial Resource Strain: Medium Risk (02/01/2024)   Overall Financial Resource Strain (CARDIA)    Difficulty of Paying Living Expenses: Somewhat hard  Food Insecurity: No Food Insecurity (02/01/2024)   Hunger Vital Sign    Worried About Running Out of Food in the Last Year: Never true    Ran Out of Food in the Last Year: Never true  Transportation Needs: No Transportation Needs (02/01/2024)   PRAPARE - Administrator, Civil Service (Medical): No    Lack  of Transportation (Non-Medical): No  Physical Activity: Insufficiently Active (02/01/2024)   Exercise Vital Sign    Days of Exercise per Week: 1 day    Minutes of Exercise per Session: 10 min  Stress: No Stress Concern Present (02/01/2024)   [employer] of Occupational Health - Occupational Stress Questionnaire    Feeling of Stress : Not at all  Social Connections: Socially Integrated (02/01/2024)   Social Connection and Isolation Panel [NHANES]    Frequency of Communication with Friends and Family: More than three times a week    Frequency of Social Gatherings with Friends and Family: Twice a week    Attends Religious Services: 1 to 4 times per year    Active Member of [employer] or Organizations: Yes    Attends Banker Meetings: 1 to 4 times per year    Marital Status: Married    Objective:  BP 102/66   Pulse 76   Temp 97.8 F (36.6 C) (Temporal)   Resp 16   Ht 5' (1.524 m)   Wt 239 lb 3.2 oz (108.5 kg)   LMP  (LMP Unknown)   SpO2 97%   BMI 46.72 kg/m      02/01/2024    1:40 PM 01/21/2024    2:01 PM 01/21/2024    1:37 PM  BP/Weight  Systolic BP 102 110 130  Diastolic BP 66 86 100  Wt. (Lbs) 239.2  242  BMI 46.72 kg/m2  47.26 kg/m2    Physical Exam Constitutional:      General: She is not in acute distress.    Appearance: Normal appearance. She is not ill-appearing.  Eyes:     Conjunctiva/sclera: Conjunctivae normal.  Cardiovascular:     Rate and Rhythm: Normal rate and regular rhythm.  Heart sounds: Normal heart sounds. No murmur heard. Pulmonary:     Effort: Pulmonary effort is normal.     Breath sounds: Normal breath sounds. No wheezing.  Musculoskeletal:        General: Normal range of motion.  Skin:    General: Skin is warm.  Neurological:     Mental Status: She is alert. Mental status is at baseline.  Psychiatric:        Mood and Affect: Mood normal.        Behavior: Behavior normal.     Lab Results  Component Value Date   WBC 8.3  01/21/2024   HGB 13.9 01/21/2024   HCT 43.1 01/21/2024   PLT 237 01/21/2024   GLUCOSE 90 01/26/2024   CHOL 170 12/15/2023   TRIG 157 (H) 12/15/2023   HDL 46 12/15/2023   LDLCALC 97 12/15/2023   ALT 275 (HH) 01/26/2024   AST 252 (HH) 01/26/2024   NA 144 01/26/2024   K 4.0 01/26/2024   CL 105 01/26/2024   CREATININE 1.40 (H) 01/26/2024   BUN 18 01/26/2024   CO2 19 (L) 01/26/2024   TSH 3.340 12/15/2023   HGBA1C 5.6 12/15/2023      Assessment & Plan:  Elevated liver enzymes Assessment & Plan: Elevated liver enzymes possibly due to Tylenol , alcohol, fenofibrate , and Lipitor. Advised abstinence and medication discontinuation to prevent hepatic damage. - Advise abstinence from alcohol. - Discontinue fenofibrate  and Lipitor. - Order additional labs to assess liver function.  Orders: -     Ceruloplasmin -     US  ABDOMEN LIMITED RUQ (LIVER/GB); Future  Elevated transaminase level Assessment & Plan: Labs drawn, Await labs/testing for assessment and recommendations   Orders: -     Ferritin -     Mitochondrial antibodies -     ANCA Profile -     Protime-INR -     ANA w/Reflex  Stage 3b chronic kidney disease (HCC) Assessment & Plan: Stage 3 CKD with solitary kidney and family history. Nephrology referral pending. - Confirm referral to [facility] and schedule appointment.  Orders: -     Ambulatory referral to Nephrology  NASH (nonalcoholic steatohepatitis) Assessment & Plan: NASH diagnosed in 2022, managed by monitoring liver function and avoiding hepatotoxic substances. - Monitor liver function through lab tests. - Avoid hepatotoxic substances.  Orders: -     Ceruloplasmin  Dysuria Assessment & Plan: UA - positive UTI I have prescribed Cipro .  Your symptoms should gradually improve. Call if the burning worsens, you develop a fever, back pain or pelvic pain or if your symptom do not resolve after completing the antibiotic. Drink plenty of  fluids Complete the full course of antibiotics even if the symptoms resolve Remember to wipe from front to back and don't hold it in! If possible, empty your bladder every 4 hours.   Orders: -     POCT URINALYSIS DIP (CLINITEK)  Acute cystitis without hematuria Assessment & Plan: UTI - positive - send urine culture - Cipro  250 mg by mouth TWICE A DAY for 5 days  Orders: -     Urine Culture -     Ciprofloxacin  HCl; Take 1 tablet (250 mg total) by mouth 2 (two) times daily for 5 days.  Dispense: 10 tablet; Refill: 0    Meds ordered this encounter  Medications   ciprofloxacin  (CIPRO ) 250 MG tablet    Sig: Take 1 tablet (250 mg total) by mouth 2 (two) times daily for 5 days.  Dispense:  10 tablet    Refill:  0    Orders Placed This Encounter  Procedures   Urine Culture   US  Abdomen Limited RUQ (LIVER/GB)   Ceruloplasmin   Ambulatory referral to Nephrology   POCT URINALYSIS DIP (CLINITEK)     Follow-up: Return in about 2 weeks (around 02/15/2024) for appt with Jarold Merlin, PA.  An After Visit Summary was printed and given to the patient.  Delford Felling, FNP [facility] [phone]

## 2024-02-01 NOTE — Patient Instructions (Addendum)
-   No alcohol - No Tylenol  - HOLD Lipitor and Fenofibrate  - Stop Doxycycline

## 2024-02-01 NOTE — Assessment & Plan Note (Signed)
 Labs drawn, Await labs/testing for assessment and recommendations

## 2024-02-01 NOTE — Assessment & Plan Note (Signed)
 NASH diagnosed in 2022, managed by monitoring liver function and avoiding hepatotoxic substances. - Monitor liver function through lab tests. - Avoid hepatotoxic substances.

## 2024-02-01 NOTE — Assessment & Plan Note (Addendum)
 Elevated liver enzymes possibly due to Tylenol , alcohol, fenofibrate , and Lipitor. Advised abstinence and medication discontinuation to prevent hepatic damage. - Advise abstinence from alcohol. - Discontinue fenofibrate  and Lipitor. - Order additional labs to assess liver function.

## 2024-02-01 NOTE — Assessment & Plan Note (Signed)
 UA - positive UTI I have prescribed Cipro .  Your symptoms should gradually improve. Call if the burning worsens, you develop a fever, back pain or pelvic pain or if your symptom do not resolve after completing the antibiotic. Drink plenty of fluids Complete the full course of antibiotics even if the symptoms resolve Remember to wipe from front to back and don't hold it in! If possible, empty your bladder every 4 hours.

## 2024-02-02 DIAGNOSIS — G4733 Obstructive sleep apnea (adult) (pediatric): Secondary | ICD-10-CM | POA: Diagnosis not present

## 2024-02-02 LAB — CERULOPLASMIN: Ceruloplasmin: 40.3 mg/dL — ABNORMAL HIGH (ref 19.0–39.0)

## 2024-02-02 LAB — URINE CULTURE

## 2024-02-04 ENCOUNTER — Other Ambulatory Visit: Payer: Self-pay

## 2024-02-04 DIAGNOSIS — K7581 Nonalcoholic steatohepatitis (NASH): Secondary | ICD-10-CM

## 2024-02-04 DIAGNOSIS — J4540 Moderate persistent asthma, uncomplicated: Secondary | ICD-10-CM | POA: Diagnosis not present

## 2024-02-04 DIAGNOSIS — R748 Abnormal levels of other serum enzymes: Secondary | ICD-10-CM

## 2024-02-08 ENCOUNTER — Encounter: Payer: Self-pay | Admitting: Physician Assistant

## 2024-02-08 LAB — FERRITIN: Ferritin: 503 ng/mL — ABNORMAL HIGH (ref 15–150)

## 2024-02-08 LAB — ANCA PROFILE
Anti-MPO Antibodies: <0.2 U (ref 0.0–0.9)
Anti-PR3 Antibodies: <0.2 U (ref 0.0–0.9)
Atypical pANCA: <1:20 {titer}
C-ANCA: <1:20 {titer}
P-ANCA: <1:20 {titer}

## 2024-02-08 LAB — MITOCHONDRIAL ANTIBODIES: Mitochondrial Ab: <20 U (ref 0.0–20.0)

## 2024-02-08 LAB — PROTIME-INR
INR: 1.1 (ref 0.9–1.2)
Prothrombin Time: 12.1 s — ABNORMAL HIGH (ref 9.1–12.0)

## 2024-02-08 LAB — ANA W/REFLEX: ANA Titer 1: NEGATIVE

## 2024-02-09 ENCOUNTER — Other Ambulatory Visit: Payer: Self-pay | Admitting: Physician Assistant

## 2024-02-09 ENCOUNTER — Ambulatory Visit: Payer: Self-pay | Admitting: Family Medicine

## 2024-02-09 ENCOUNTER — Other Ambulatory Visit: Payer: Self-pay | Admitting: Family Medicine

## 2024-02-09 DIAGNOSIS — R7989 Other specified abnormal findings of blood chemistry: Secondary | ICD-10-CM

## 2024-02-09 NOTE — Telephone Encounter (Signed)
 You can provide the Pt with the office contact. I also sent her a MyChart message with office information. The office should reach out for scheduling, but the Pt can follow-up with office.

## 2024-02-11 ENCOUNTER — Encounter: Payer: Self-pay | Admitting: Physician Assistant

## 2024-02-16 ENCOUNTER — Encounter: Payer: Self-pay | Admitting: Physician Assistant

## 2024-02-16 ENCOUNTER — Ambulatory Visit: Admitting: Physician Assistant

## 2024-02-16 ENCOUNTER — Other Ambulatory Visit: Payer: Self-pay | Admitting: Physician Assistant

## 2024-02-16 VITALS — BP 106/72 | HR 93 | Temp 98.5°F | Resp 18 | Ht 60.0 in | Wt 237.0 lb

## 2024-02-16 DIAGNOSIS — R82998 Other abnormal findings in urine: Secondary | ICD-10-CM

## 2024-02-16 DIAGNOSIS — R748 Abnormal levels of other serum enzymes: Secondary | ICD-10-CM

## 2024-02-16 DIAGNOSIS — R899 Unspecified abnormal finding in specimens from other organs, systems and tissues: Secondary | ICD-10-CM | POA: Diagnosis not present

## 2024-02-16 HISTORY — DX: Other abnormal findings in urine: R82.998

## 2024-02-16 HISTORY — DX: Unspecified abnormal finding in specimens from other organs, systems and tissues: R89.9

## 2024-02-16 LAB — POCT URINALYSIS DIP (CLINITEK)
Bilirubin, UA: NEGATIVE
Blood, UA: NEGATIVE
Glucose, UA: NEGATIVE mg/dL
Ketones, POC UA: NEGATIVE mg/dL
Nitrite, UA: NEGATIVE
POC PROTEIN,UA: NEGATIVE
Spec Grav, UA: 1.015 (ref 1.010–1.025)
Urobilinogen, UA: 0.2 U/dL
pH, UA: 5.5 (ref 5.0–8.0)

## 2024-02-16 NOTE — Progress Notes (Signed)
 Subjective:  Patient ID: Cindy Hammond, female    DOB: 1965/09/02  Age: 58 y.o. MRN: [id number]  Chief Complaint  Patient presents with   Urinary Tract Infection    HPI Pt in today for follow up of uti - she states she is currently having no symptoms at all and has completed antibiotics. She denies any vaginal symtpoms She is due to repeat ua  Pt was noted to have significantly elevated liver enzymes with last labwork - other providers managed her results and she was scheduled for a GI referral on July 22, a RUQ ultrasound on Friday and an appt with hematology on Friday She currently is having no pain, nausea, or vomiting     02/16/2024    2:40 PM 12/15/2023    8:10 AM 08/12/2023    9:24 AM 05/14/2023    9:23 AM 04/07/2023    1:29 PM  Depression screen PHQ 2/9  Decreased Interest 0 0 1 1 0  Down, Depressed, Hopeless 0 0 1 1 1   PHQ - 2 Score 0 0 2 2 1   Altered sleeping 0  0 0 0  Tired, decreased energy 1  1 1 1   Change in appetite 1  1 0 1  Feeling bad or failure about yourself  0  0 0 0  Trouble concentrating 0  0 0 0  Moving slowly or fidgety/restless   0 0 0  Suicidal thoughts 0  0 0 0  PHQ-9 Score 2  4 3 3   Difficult doing work/chores Not difficult at all  Not difficult at all          11/11/2022   10:03 AM 04/07/2023    1:29 PM 08/12/2023    9:24 AM 12/15/2023    8:10 AM 02/16/2024    2:40 PM  Fall Risk  Falls in the past year? 0 1 0 0 0  Was there an injury with Fall? 0 0 0 0 0  Fall Risk Category Calculator 0 1 0 0 0  Patient at Risk for Falls Due to No Fall Risks No Fall Risks No Fall Risks No Fall Risks No Fall Risks  Fall risk Follow up Falls evaluation completed Falls evaluation completed Falls evaluation completed  Falls evaluation completed     ROS CONSTITUTIONAL: Negative for chills, fatigue, fever,   CARDIOVASCULAR: Negative for chest pain, dizziness, palpitations  RESPIRATORY: Negative for recent cough and dyspnea.  GASTROINTESTINAL: see HPI GU -  negative   Current Outpatient Medications:    albuterol  (VENTOLIN  HFA) 108 (90 Base) MCG/ACT inhaler, INHALE 1-2 PUFFS INTO THE LUNGS EVERY 6 HOURS AS NEEDED FOR WHEEZING OR SHORTNESS OF BREATH, Disp: 8.5 g, Rfl: 2   amitriptyline  (ELAVIL ) 150 MG tablet, TAKE 1 TABLET BY MOUTH DAILY AT BEDTIME., Disp: 90 tablet, Rfl: 0   atenolol  (TENORMIN ) 25 MG tablet, Take 1 tablet (25 mg total) by mouth 2 (two) times daily., Disp: 180 tablet, Rfl: 2   atorvastatin  (LIPITOR) 80 MG tablet, Take 1 tablet by mouth ONCE daily., Disp: 90 tablet, Rfl: 1   clonazePAM  (KLONOPIN ) 0.5 MG tablet, TAKE ONE TABLET BY MOUTH TWICE DAILY, Disp: 60 tablet, Rfl: 1   KRILL OIL PO, Take 1 capsule by mouth daily., Disp: , Rfl:    meloxicam  (MOBIC ) 15 MG tablet, Take 1 tablet (15 mg total) by mouth daily., Disp: 30 tablet, Rfl: 2   montelukast (SINGULAIR) 10 MG tablet, Take 10 mg by mouth daily., Disp: , Rfl:    nitroGLYCERIN  (NITROSTAT )  0.4 MG SL tablet, PLACE 1 TABLET UNDER THE TONGUE AS NEEDED FOR CHEST PAIN ( MAY REPEAT EVERY5 MINUTES X 3), Disp: 25 tablet, Rfl: 3   omeprazole  (PRILOSEC) 40 MG capsule, Take 1 capsule by mouth once daily. (Patient taking differently: Take 40 mg by mouth daily as needed (Heartburn).), Disp: 90 capsule, Rfl: 1   OXYGEN , Inhale 1 L into the lungs at bedtime., Disp: , Rfl:    pregabalin  (LYRICA ) 75 MG capsule, Take 1 capsule by mouth 3 times daily., Disp: 270 capsule, Rfl: 0   QUEtiapine  (SEROQUEL ) 300 MG tablet, Take 1 tablet by mouth once daily. AT BEDTIME, Disp: 90 tablet, Rfl: 1   ranolazine  (RANEXA ) 500 MG 12 hr tablet, Take 1 tablet by mouth twice daily. (Patient taking differently: Take 500 mg by mouth at bedtime.), Disp: 60 tablet, Rfl: 11   SYMBICORT 160-4.5 MCG/ACT inhaler, Inhale 2 puffs into the lungs 2 (two) times daily., Disp: , Rfl:    topiramate  (TOPAMAX ) 100 MG tablet, Take 1 tablet by mouth 3 times daily. (Patient taking differently: Take 100 mg by mouth 2 (two) times daily.), Disp:  270 tablet, Rfl: 0   torsemide  (DEMADEX ) 20 MG tablet, Take 1 tablet (20 mg total) by mouth daily., Disp: 90 tablet, Rfl: 3   Ubrogepant  (UBRELVY ) 100 MG TABS, Take 100 mg by mouth 2 (two) times daily as needed (migraine)., Disp: , Rfl:    Vitamin D , Ergocalciferol , (DRISDOL ) 1.25 MG (50000 UNIT) CAPS capsule, Take 1 capsule by mouth twice a week as directed, Disp: 10 capsule, Rfl: 1  Past Medical History:  Diagnosis Date   Anxiety    Depression    GERD (gastroesophageal reflux disease)    Hyperlipidemia    Hypertension    Pain in joint, lower leg    Thrombocytopenia (HCC)    Objective:  PHYSICAL EXAM:   BP 106/72   Pulse 93   Temp 98.5 F (36.9 C) (Temporal)   Resp 18   Ht 5' (1.524 m)   Wt 237 lb (107.5 kg)   LMP  (LMP Unknown)   SpO2 95%   BMI 46.29 kg/m    GEN: Well nourished, well developed, in no acute distress  Cardiac: RRR; no murmurs,  Respiratory:  normal respiratory rate and pattern with no distress - normal breath sounds with no rales, rhonchi, wheezes or rubs Abdomen - no tenderness to palpation Skin: warm and dry, no rash  Psych: euthymic mood, appropriate affect and demeanor  Office Visit on 02/16/2024  Component Date Value Ref Range Status   Color, UA 02/16/2024 yellow  yellow Final   Clarity, UA 02/16/2024 cloudy (A)  clear Final   Glucose, UA 02/16/2024 negative  negative mg/dL Final   Bilirubin, UA 98/07/9146 negative  negative Final   Ketones, POC UA 02/16/2024 negative  negative mg/dL Final   Spec Grav, UA 82/95/6213 1.015  1.010 - 1.025 Final   Blood, UA 02/16/2024 negative  negative Final   pH, UA 02/16/2024 5.5  5.0 - 8.0 Final   POC PROTEIN,UA 02/16/2024 negative  negative, trace Final   Urobilinogen, UA 02/16/2024 0.2  0.2 or 1.0 E.U./dL Final   Nitrite, UA 08/65/7846 Negative  Negative Final   Leukocytes, UA 02/16/2024 Large (3+) (A)  Negative Final    Assessment & Plan:    Elevated liver enzymes -     CBC with  Differential/Platelet -     Comprehensive metabolic panel with GFR Continue follow up with specialists as directed Abnormal laboratory  test -     CBC with Differential/Platelet -     Comprehensive metabolic panel with GFR  Leukocytes in urine -     POCT URINALYSIS DIP (CLINITEK) -     Urine Culture     Follow-up: Return in about 3 months (around 05/18/2024) for chronic fasting follow-up.  An After Visit Summary was printed and given to the patient.  Anthonette Bastos [facility] [phone]

## 2024-02-17 ENCOUNTER — Ambulatory Visit: Payer: Self-pay | Admitting: Physician Assistant

## 2024-02-17 DIAGNOSIS — M542 Cervicalgia: Secondary | ICD-10-CM | POA: Diagnosis not present

## 2024-02-17 DIAGNOSIS — G43709 Chronic migraine without aura, not intractable, without status migrainosus: Secondary | ICD-10-CM | POA: Diagnosis not present

## 2024-02-17 LAB — CBC WITH DIFFERENTIAL/PLATELET
Basophils Absolute: 0.1 10*3/uL (ref 0.0–0.2)
Basos: 1 %
EOS (ABSOLUTE): 0.5 10*3/uL — ABNORMAL HIGH (ref 0.0–0.4)
Eos: 5 %
Hematocrit: 47 % — ABNORMAL HIGH (ref 34.0–46.6)
Hemoglobin: 15.4 g/dL (ref 11.1–15.9)
Immature Grans (Abs): 0 10*3/uL (ref 0.0–0.1)
Immature Granulocytes: 0 %
Lymphocytes Absolute: 2 10*3/uL (ref 0.7–3.1)
Lymphs: 21 %
MCH: 31.8 pg (ref 26.6–33.0)
MCHC: 32.8 g/dL (ref 31.5–35.7)
MCV: 97 fL (ref 79–97)
Monocytes Absolute: 0.9 10*3/uL (ref 0.1–0.9)
Monocytes: 10 %
Neutrophils Absolute: 5.9 10*3/uL (ref 1.4–7.0)
Neutrophils: 63 %
Platelets: 287 10*3/uL (ref 150–450)
RBC: 4.85 x10E6/uL (ref 3.77–5.28)
RDW: 12.3 % (ref 11.7–15.4)
WBC: 9.4 10*3/uL (ref 3.4–10.8)

## 2024-02-17 LAB — COMPREHENSIVE METABOLIC PANEL WITH GFR
ALT: 65 IU/L — ABNORMAL HIGH (ref 0–32)
AST: 36 IU/L (ref 0–40)
Albumin: 4.2 g/dL (ref 3.8–4.9)
Alkaline Phosphatase: 214 IU/L — ABNORMAL HIGH (ref 44–121)
BUN/Creatinine Ratio: 16 (ref 9–23)
BUN: 18 mg/dL (ref 6–24)
Bilirubin Total: 0.5 mg/dL (ref 0.0–1.2)
CO2: 20 mmol/L (ref 20–29)
Calcium: 9.8 mg/dL (ref 8.7–10.2)
Chloride: 107 mmol/L — ABNORMAL HIGH (ref 96–106)
Creatinine, Ser: 1.14 mg/dL — ABNORMAL HIGH (ref 0.57–1.00)
Globulin, Total: 3.2 g/dL (ref 1.5–4.5)
Glucose: 116 mg/dL — ABNORMAL HIGH (ref 70–99)
Potassium: 4 mmol/L (ref 3.5–5.2)
Sodium: 144 mmol/L (ref 134–144)
Total Protein: 7.4 g/dL (ref 6.0–8.5)
eGFR: 56 mL/min/{1.73_m2} — ABNORMAL LOW (ref >59)

## 2024-02-18 LAB — URINE CULTURE

## 2024-02-19 ENCOUNTER — Ambulatory Visit (HOSPITAL_BASED_OUTPATIENT_CLINIC_OR_DEPARTMENT_OTHER)
Admission: RE | Admit: 2024-02-19 | Discharge: 2024-02-19 | Disposition: A | Discharge status: Home / Self Care | Source: Ambulatory Visit | Attending: Family Medicine | Admitting: Family Medicine

## 2024-02-19 DIAGNOSIS — R748 Abnormal levels of other serum enzymes: Secondary | ICD-10-CM

## 2024-02-19 DIAGNOSIS — R932 Abnormal findings on diagnostic imaging of liver and biliary tract: Secondary | ICD-10-CM | POA: Diagnosis not present

## 2024-02-22 ENCOUNTER — Encounter: Payer: Self-pay | Admitting: Physician Assistant

## 2024-02-22 ENCOUNTER — Ambulatory Visit: Attending: Cardiology

## 2024-02-22 DIAGNOSIS — R42 Dizziness and giddiness: Secondary | ICD-10-CM

## 2024-02-23 DIAGNOSIS — J4540 Moderate persistent asthma, uncomplicated: Secondary | ICD-10-CM | POA: Diagnosis not present

## 2024-02-23 DIAGNOSIS — R0600 Dyspnea, unspecified: Secondary | ICD-10-CM | POA: Diagnosis not present

## 2024-02-23 DIAGNOSIS — R5383 Other fatigue: Secondary | ICD-10-CM | POA: Diagnosis not present

## 2024-02-23 DIAGNOSIS — G4736 Sleep related hypoventilation in conditions classified elsewhere: Secondary | ICD-10-CM | POA: Diagnosis not present

## 2024-02-24 ENCOUNTER — Other Ambulatory Visit: Payer: Self-pay | Admitting: Hematology and Oncology

## 2024-02-24 ENCOUNTER — Encounter: Payer: Self-pay | Admitting: Hematology and Oncology

## 2024-02-24 ENCOUNTER — Inpatient Hospital Stay

## 2024-02-24 ENCOUNTER — Other Ambulatory Visit: Payer: Self-pay

## 2024-02-24 ENCOUNTER — Encounter: Payer: Self-pay | Admitting: Physician Assistant

## 2024-02-24 ENCOUNTER — Ambulatory Visit: Payer: Self-pay | Admitting: Family Medicine

## 2024-02-24 ENCOUNTER — Inpatient Hospital Stay: Attending: Hematology and Oncology | Admitting: Hematology and Oncology

## 2024-02-24 VITALS — BP 116/93 | HR 69 | Temp 97.8°F | Resp 20 | Ht 60.0 in | Wt 238.5 lb

## 2024-02-24 DIAGNOSIS — R748 Abnormal levels of other serum enzymes: Secondary | ICD-10-CM | POA: Insufficient documentation

## 2024-02-24 DIAGNOSIS — R7989 Other specified abnormal findings of blood chemistry: Secondary | ICD-10-CM

## 2024-02-24 DIAGNOSIS — Z87891 Personal history of nicotine dependence: Secondary | ICD-10-CM | POA: Diagnosis not present

## 2024-02-24 DIAGNOSIS — I10 Essential (primary) hypertension: Secondary | ICD-10-CM | POA: Insufficient documentation

## 2024-02-24 DIAGNOSIS — R519 Headache, unspecified: Secondary | ICD-10-CM | POA: Insufficient documentation

## 2024-02-24 DIAGNOSIS — N1832 Chronic kidney disease, stage 3b: Secondary | ICD-10-CM | POA: Insufficient documentation

## 2024-02-24 DIAGNOSIS — D582 Other hemoglobinopathies: Secondary | ICD-10-CM | POA: Insufficient documentation

## 2024-02-24 LAB — CMP (CANCER CENTER ONLY)
ALT: 43 U/L (ref 0–44)
AST: 29 U/L (ref 15–41)
Albumin: 3.9 g/dL (ref 3.5–5.0)
Alkaline Phosphatase: 172 U/L — ABNORMAL HIGH (ref 38–126)
Anion gap: 10 (ref 5–15)
BUN: 18 mg/dL (ref 6–20)
CO2: 20 mmol/L — ABNORMAL LOW (ref 22–32)
Calcium: 9.6 mg/dL (ref 8.9–10.3)
Chloride: 111 mmol/L (ref 98–111)
Creatinine: 1.07 mg/dL — ABNORMAL HIGH (ref 0.44–1.00)
GFR, Estimated: >60 mL/min (ref >60)
Glucose, Bld: 89 mg/dL (ref 70–99)
Potassium: 4.2 mmol/L (ref 3.5–5.1)
Sodium: 141 mmol/L (ref 135–145)
Total Bilirubin: 0.5 mg/dL (ref 0.0–1.2)
Total Protein: 7.1 g/dL (ref 6.5–8.1)

## 2024-02-24 LAB — CBC WITH DIFFERENTIAL (CANCER CENTER ONLY)
Abs Immature Granulocytes: 0.04 10*3/uL (ref 0.00–0.07)
Basophils Absolute: 0.1 10*3/uL (ref 0.0–0.1)
Basophils Relative: 1 %
Eosinophils Absolute: 0.4 10*3/uL (ref 0.0–0.5)
Eosinophils Relative: 5 %
HCT: 43.8 % (ref 36.0–46.0)
Hemoglobin: 14.3 g/dL (ref 12.0–15.0)
Immature Granulocytes: 1 %
Lymphocytes Relative: 25 %
Lymphs Abs: 2 10*3/uL (ref 0.7–4.0)
MCH: 31.1 pg (ref 26.0–34.0)
MCHC: 32.6 g/dL (ref 30.0–36.0)
MCV: 95.2 fL (ref 80.0–100.0)
Monocytes Absolute: 0.8 10*3/uL (ref 0.1–1.0)
Monocytes Relative: 10 %
Neutro Abs: 4.7 10*3/uL (ref 1.7–7.7)
Neutrophils Relative %: 58 %
Platelet Count: 255 10*3/uL (ref 150–400)
RBC: 4.6 MIL/uL (ref 3.87–5.11)
RDW: 13.5 % (ref 11.5–15.5)
WBC Count: 8 10*3/uL (ref 4.0–10.5)
nRBC: 0 % (ref 0.0–0.2)

## 2024-02-24 LAB — FOLATE: Folate: 6.6 ng/mL (ref >5.9)

## 2024-02-24 LAB — IRON AND TIBC
Iron: 104 ug/dL (ref 28–170)
Saturation Ratios: 25 % (ref 10.4–31.8)
TIBC: 413 ug/dL (ref 250–450)
UIBC: 309 ug/dL

## 2024-02-24 LAB — VITAMIN B12: Vitamin B-12: 301 pg/mL (ref 180–914)

## 2024-02-24 LAB — FERRITIN: Ferritin: 99 ng/mL (ref 11–307)

## 2024-02-24 LAB — TSH: TSH: 2.137 u[IU]/mL (ref 0.350–4.500)

## 2024-02-24 NOTE — Progress Notes (Signed)
 [facility] [address] [place],  [place]  [address] [phone]  Clinic Day:  03/15/2024   Referring physician: Nicholaus Credit, PA-C  Patient Care Team: Patient Care Team: Nicholaus Credit, DEVONNA as PCP - General (Physician Assistant) Sheena Pugh, DO as PCP - Cardiology (Cardiology)   REASON FOR CONSULTATION:  Elevated Ferritin  HISTORY OF PRESENT ILLNESS:   Cindy Hammond is a 58 y.o. female with a history of elevated ferritin who is referred in consultation by Credit Nicholaus for assessment and management. She presented to PCP for possible UTI and was found to have elevated liver enzymes with Alk phos 214 and AST 65. Further studies revealed an elevated ferritin at 503.  Medical history is significant for depression, GERD, hyperlipidemia, hypertension, pain in joint,lower leg, thrombocytopenia, stage IIIB kidney disease, NASH and chronic migraines. Surgical history is significant for heart cath and tubal ligation. Family history is significant for hyperlipidemia and hypertension. She is married with 3 children. She is a former smoker having smoked 0.5 pack per day for 10 years, denies use of alcohol or drugs. She denies fever, chills, nausea or vomiting. She denies shortness of breath, chest pain or cough. She denies issue with bowel or bladder. Her appetite is the same with no changes to her weight.    REVIEW OF SYSTEMS:   Review of Systems  Constitutional: Negative.   HENT:  Negative.    Eyes: Negative.   Respiratory: Negative.    Cardiovascular: Negative.   Gastrointestinal: Negative.   Endocrine: Negative.   Genitourinary: Negative.    Musculoskeletal: Negative.   Skin: Negative.   Neurological:  Positive for headaches.  Hematological: Negative.   Psychiatric/Behavioral: Negative.       VITALS:   Blood pressure (!) 116/93, pulse 69, temperature 97.8 F (36.6 C), temperature source Oral, resp. rate 20, height 5' (1.524 m), weight 238 lb 8 oz (108.2 kg), SpO2  94%.  Wt Readings from Last 3 Encounters:  03/09/24 236 lb 4.8 oz (107.2 kg)  03/03/24 240 lb 12.8 oz (109.2 kg)  02/24/24 238 lb 8 oz (108.2 kg)    Body mass index is 46.58 kg/m.  Performance status (ECOG): 1 - Symptomatic but completely ambulatory  PHYSICAL EXAM:   Physical Exam Constitutional:      Appearance: Normal appearance. She is normal weight.  HENT:     Head: Normocephalic and atraumatic.     Mouth/Throat:     Mouth: Mucous membranes are dry.  Cardiovascular:     Rate and Rhythm: Normal rate and regular rhythm.     Pulses: Normal pulses.     Heart sounds: Normal heart sounds.  Pulmonary:     Effort: Pulmonary effort is normal.     Breath sounds: Normal breath sounds.  Abdominal:     Palpations: Abdomen is soft.  Musculoskeletal:        General: Normal range of motion.     Cervical back: Normal range of motion.  Skin:    General: Skin is warm and dry.  Neurological:     General: No focal deficit present.     Mental Status: She is alert and oriented to person, place, and time. Mental status is at baseline.  Psychiatric:        Mood and Affect: Mood normal.        Behavior: Behavior normal.        Thought Content: Thought content normal.        Judgment: Judgment normal.  LABS:      Latest Ref Rng & Units 03/09/2024    1:59 PM 03/03/2024    4:02 PM 02/24/2024    2:13 PM  CBC  WBC 4.0 - 10.5 K/uL 7.8  8.7  8.0   Hemoglobin 12.0 - 15.0 g/dL 84.4  84.6  85.6   Hematocrit 36.0 - 46.0 % 46.4  47.6  43.8   Platelets 150 - 400 K/uL 240  257  255       Latest Ref Rng & Units 03/03/2024    4:02 PM 02/24/2024    2:13 PM 02/16/2024    3:08 PM  CMP  Glucose 70 - 99 mg/dL 891  89  883   BUN 6 - 24 mg/dL 16  18  18    Creatinine 0.57 - 1.00 mg/dL 8.72  8.92  8.85   Sodium 134 - 144 mmol/L 146  141  144   Potassium 3.5 - 5.2 mmol/L 3.9  4.2  4.0   Chloride 96 - 106 mmol/L 109  111  107   CO2 20 - 29 mmol/L 21  20  20    Calcium  8.7 - 10.2 mg/dL 9.0  9.6  9.8    Total Protein 6.0 - 8.5 g/dL 7.4  7.1  7.4   Total Bilirubin 0.0 - 1.2 mg/dL 0.6  0.5  0.5   Alkaline Phos 44 - 121 IU/L 176  172  214   AST 0 - 40 IU/L 15  29  36   ALT 0 - 32 IU/L 22  43  65      No results found for: CEA1, CEA / No results found for: CEA1, CEA No results found for: PSA1 No results found for: CAN199 No results found for: CAN125  No results found for: STEPHANY RINGS, A1GS, A2GS, EARLA BABCOCK, GAMS, MSPIKE, SPEI Lab Results  Component Value Date   TIBC 413 02/24/2024   TIBC 312 05/14/2023   FERRITIN 63 03/09/2024   FERRITIN 99 02/24/2024   FERRITIN 503 (H) 02/01/2024   IRONPCTSAT 25 02/24/2024   IRONPCTSAT 30 05/14/2023   No results found for: LDH  STUDIES:   Cardiac event monitor Result Date: 03/15/2024 The patient wore the monitor for 30 days starting Jan 20, 2024. Indication: Dizziness The minimum heart rate was 66 bpm, maximum heart rate was   bpm, and average heart rate was 84 bpm. Predominant underlying rhythm was Sinus Rhythm. Premature atrial complexes were Premature Ventricular complexes No pauses, No AV block and no atrial fibrillation present. Symptoms associated with sinus rhythm Conclusion: Normal/unremarkable study   US  Abdomen Limited RUQ (LIVER/GB) Result Date: 02/24/2024 CLINICAL DATA:  Elevated liver enzymes. EXAM: ULTRASOUND ABDOMEN LIMITED RIGHT UPPER QUADRANT COMPARISON:  March 16, 2019 FINDINGS: Gallbladder: No gallstones or wall thickening visualized. No sonographic Murphy sign noted by sonographer. Common bile duct: Diameter: 4 mm. Liver: No focal lesion identified. Mild increased echotexture. Portal vein is patent on color Doppler imaging with normal direction of blood flow towards the liver. Other: None. IMPRESSION: 1. No acute abnormality identified. 2. Mild increased echotexture of the liver. This is nonspecific but can be seen in fatty infiltration of liver. Electronically Signed   By: Craig Farr M.D.   On: 02/24/2024 10:54      HISTORY:   Past Medical History:  Diagnosis Date   Anxiety    Depression    GERD (gastroesophageal reflux disease)    Hyperlipidemia    Hypertension    Pain in joint, lower leg  Thrombocytopenia (HCC)     Past Surgical History:  Procedure Laterality Date   RIGHT/LEFT HEART CATH AND CORONARY ANGIOGRAPHY N/A 01/12/2024   Procedure: RIGHT/LEFT HEART CATH AND CORONARY ANGIOGRAPHY;  Surgeon: Verlin Lonni BIRCH, MD;  Location: [facility] IN[facility]AS[facility];  Service: Cardiovascular;  Laterality: N/A;   TUBAL LIGATION      Family History  Problem Relation Age of Onset   Hyperlipidemia Mother    Hypertension Mother    Hyperlipidemia Father    Hypertension Father     Social History:  reports that she quit smoking about 25 years ago. Her smoking use included cigarettes. She started smoking about 35 years ago. She has a 5 pack-year smoking history. She has never used smokeless tobacco. She reports that she does not drink alcohol and does not use drugs.The patient is alone  today.  Allergies:  Allergies  Allergen Reactions   Sumatriptan Hives and Rash   Sulfa Antibiotics     Current Medications: Current Outpatient Medications  Medication Sig Dispense Refill   albuterol  (VENTOLIN  HFA) 108 (90 Base) MCG/ACT inhaler INHALE 1-2 PUFFS INTO THE LUNGS EVERY 6 HOURS AS NEEDED FOR WHEEZING OR SHORTNESS OF BREATH 8.5 g 2   atenolol  (TENORMIN ) 25 MG tablet Take 1 tablet (25 mg total) by mouth 2 (two) times daily. 180 tablet 2   atorvastatin  (LIPITOR) 80 MG tablet Take 1 tablet by mouth ONCE daily. 90 tablet 1   EMGALITY 120 MG/ML SOAJ SMARTSIG:2 Milliliter(s) SUB-Q Once     KRILL OIL PO Take 1 capsule by mouth daily.     meloxicam  (MOBIC ) 15 MG tablet Take 1 tablet (15 mg total) by mouth daily. 30 tablet 2   montelukast (SINGULAIR) 10 MG tablet Take 10 mg by mouth daily.     nitroGLYCERIN  (NITROSTAT ) 0.4 MG SL tablet PLACE 1 TABLET UNDER THE TONGUE AS  NEEDED FOR CHEST PAIN ( MAY REPEAT EVERY5 MINUTES X 3) 25 tablet 3   omeprazole  (PRILOSEC) 40 MG capsule Take 1 capsule by mouth once daily. (Patient taking differently: Take 40 mg by mouth daily as needed (Heartburn).) 90 capsule 1   OXYGEN  Inhale 1 L into the lungs at bedtime.     pregabalin  (LYRICA ) 75 MG capsule Take 1 capsule by mouth 3 times daily. 270 capsule 0   QUEtiapine  (SEROQUEL ) 300 MG tablet Take 1 tablet by mouth once daily. AT BEDTIME 90 tablet 1   ranolazine  (RANEXA ) 500 MG 12 hr tablet Take 1 tablet by mouth twice daily. 60 tablet 11   SYMBICORT 160-4.5 MCG/ACT inhaler Inhale 2 puffs into the lungs 2 (two) times daily.     topiramate  (TOPAMAX ) 100 MG tablet Take 1 tablet by mouth 3 times daily. 270 tablet 0   torsemide  (DEMADEX ) 20 MG tablet Take 1 tablet (20 mg total) by mouth daily. 90 tablet 3   Ubrogepant  (UBRELVY ) 100 MG TABS Take 100 mg by mouth 2 (two) times daily as needed (migraine).     amitriptyline  (ELAVIL ) 150 MG tablet TAKE 1 TABLET BY MOUTH DAILY AT BEDTIME. 90 tablet 0   clonazePAM  (KLONOPIN ) 0.5 MG tablet TAKE ONE TABLET BY MOUTH TWICE DAILY 60 tablet 1   Vitamin D , Ergocalciferol , (DRISDOL ) 1.25 MG (50000 UNIT) CAPS capsule Take 1 capsule by mouth twice a week as directed 10 capsule 1   No current facility-administered medications for this visit.     ASSESSMENT & PLAN:   Assessment:  Kaelie Henigan is a 58 y.o. female with history of elevated ferritin.  She presents today asymptomatic other than chronic headaches. She states her PCP told her the elevated liver enzymes could be medication related and changes were made. Her headaches are being managed with multiple treatments and followed by neurology. Today, her CBC is unremarkable other than a slightly elevated hemoglobin. CMP reveals improved alk phos from 214 to 172. Ferritin today has decreased from 503 to 99.   Plan: 1.  We will repeat evaluation in 2 weeks.   I discussed the assessment and treatment plan  with the patient.  The patient was provided an opportunity to ask questions and all were answered.  The patient agreed with the plan and demonstrated an understanding of the instructions.    Thank you for the referral    45 minutes was spent in patient care.  This included time spent preparing to see the patient (e.g., review of tests), obtaining and/or reviewing separately obtained history, counseling and educating the patient/family/caregiver, ordering medications, tests, or procedures; documenting clinical information in the electronic or other health record, independently interpreting results and communicating results to the patient/family/caregiver as well as coordination of care.      Eleanor DELENA Bach, NP   Family Nurse Practitioner - [facility] [phone]

## 2024-02-25 ENCOUNTER — Other Ambulatory Visit: Payer: Self-pay | Admitting: Physician Assistant

## 2024-02-25 DIAGNOSIS — F419 Anxiety disorder, unspecified: Secondary | ICD-10-CM

## 2024-02-25 NOTE — Telephone Encounter (Signed)
 Patient scheduled.

## 2024-02-29 ENCOUNTER — Other Ambulatory Visit: Payer: Self-pay | Admitting: Physician Assistant

## 2024-02-29 DIAGNOSIS — F419 Anxiety disorder, unspecified: Secondary | ICD-10-CM

## 2024-02-29 LAB — HEMOCHROMATOSIS DNA-PCR(C282Y,H63D)

## 2024-03-01 ENCOUNTER — Encounter: Payer: Self-pay | Admitting: Physician Assistant

## 2024-03-02 LAB — JAK2 V617F RFX CALR/MPL/E12-15

## 2024-03-02 LAB — CALR +MPL + E12-E15  (REFLEX)

## 2024-03-03 ENCOUNTER — Encounter: Payer: Self-pay | Admitting: Physician Assistant

## 2024-03-03 ENCOUNTER — Ambulatory Visit: Admitting: Physician Assistant

## 2024-03-03 VITALS — BP 104/80 | HR 99 | Temp 97.5°F | Resp 16 | Ht 60.0 in | Wt 240.8 lb

## 2024-03-03 DIAGNOSIS — R5383 Other fatigue: Secondary | ICD-10-CM

## 2024-03-03 DIAGNOSIS — R0602 Shortness of breath: Secondary | ICD-10-CM

## 2024-03-03 NOTE — Progress Notes (Signed)
 Subjective:  Patient ID: Cindy Hammond, female    DOB: 1966-03-05  Age: 58 y.o. MRN: [id number]  Chief Complaint  Patient presents with   Medical Management of Chronic Issues    HPI Pt states that over the past month she has had several spells of feeling light headed and feeling like she almost may pass out  She denies chest pain or palpitations - she does say she has exertional dyspnea with light to moderate activity Pt does follow with cardiology - last echo was done 4/24 which was stable.  She had a cardiac cath 5/25 which only showed 20% blockage of mid LAD.  She did wear an event monitor but only for a short time because it bothered her skin  Pt does follow with pulmonology regularly as well and just saw Dr Mardee a few weeks ago  Pt states she has not had pulmonary function testing as far as she knows and no chest xray recently  Pt was recently referred to hematology for elevated ferritin and also to GI for fatty liver and prior elevated liver enzymes     02/24/2024    2:56 PM 02/16/2024    2:40 PM 12/15/2023    8:10 AM 08/12/2023    9:24 AM 05/14/2023    9:23 AM  Depression screen PHQ 2/9  Decreased Interest 0 0 0 1 1  Down, Depressed, Hopeless 0 0 0 1 1  PHQ - 2 Score 0 0 0 2 2  Altered sleeping 0 0  0 0  Tired, decreased energy 1 1  1 1   Change in appetite 1 1  1  0  Feeling bad or failure about yourself  0 0  0 0  Trouble concentrating 0 0  0 0  Moving slowly or fidgety/restless    0 0  Suicidal thoughts 0 0  0 0  PHQ-9 Score 2 2  4 3   Difficult doing work/chores  Not difficult at all  Not difficult at all         11/11/2022   10:03 AM 04/07/2023    1:29 PM 08/12/2023    9:24 AM 12/15/2023    8:10 AM 02/16/2024    2:40 PM  Fall Risk  Falls in the past year? 0 1 0 0 0  Was there an injury with Fall? 0 0 0 0 0  Fall Risk Category Calculator 0 1 0 0 0  Patient at Risk for Falls Due to No Fall Risks No Fall Risks No Fall Risks No Fall Risks No Fall Risks  Fall risk  Follow up Falls evaluation completed Falls evaluation completed Falls evaluation completed  Falls evaluation completed     ROS CONSTITUTIONAL: see HPI E/N/T: Negative for ear pain, nasal congestion and sore throat.  CARDIOVASCULAR:see HPI RESPIRATORY: see HPI GASTROINTESTINAL: Negative for abdominal pain, acid reflux symptoms, constipation, diarrhea, nausea and vomiting.  MSK: Negative for arthralgias and myalgias.  INTEGUMENTARY: Negative for rash.  PSYCHIATRIC: Negative for sleep disturbance and to question depression screen.  Negative for depression, negative for anhedonia.    Current Outpatient Medications:    albuterol  (VENTOLIN  HFA) 108 (90 Base) MCG/ACT inhaler, INHALE 1-2 PUFFS INTO THE LUNGS EVERY 6 HOURS AS NEEDED FOR WHEEZING OR SHORTNESS OF BREATH, Disp: 8.5 g, Rfl: 2   amitriptyline  (ELAVIL ) 150 MG tablet, TAKE 1 TABLET BY MOUTH DAILY AT BEDTIME., Disp: 90 tablet, Rfl: 0   atenolol  (TENORMIN ) 25 MG tablet, Take 1 tablet (25 mg total) by mouth 2 (two) times  daily., Disp: 180 tablet, Rfl: 2   atorvastatin  (LIPITOR) 80 MG tablet, Take 1 tablet by mouth ONCE daily., Disp: 90 tablet, Rfl: 1   clonazePAM  (KLONOPIN ) 0.5 MG tablet, TAKE ONE TABLET BY MOUTH TWICE DAILY, Disp: 60 tablet, Rfl: 1   EMGALITY 120 MG/ML SOAJ, SMARTSIG:2 Milliliter(s) SUB-Q Once, Disp: , Rfl:    KRILL OIL PO, Take 1 capsule by mouth daily., Disp: , Rfl:    meloxicam  (MOBIC ) 15 MG tablet, Take 1 tablet (15 mg total) by mouth daily., Disp: 30 tablet, Rfl: 2   montelukast (SINGULAIR) 10 MG tablet, Take 10 mg by mouth daily., Disp: , Rfl:    nitroGLYCERIN  (NITROSTAT ) 0.4 MG SL tablet, PLACE 1 TABLET UNDER THE TONGUE AS NEEDED FOR CHEST PAIN ( MAY REPEAT EVERY5 MINUTES X 3), Disp: 25 tablet, Rfl: 3   omeprazole  (PRILOSEC) 40 MG capsule, Take 1 capsule by mouth once daily. (Patient taking differently: Take 40 mg by mouth daily as needed (Heartburn).), Disp: 90 capsule, Rfl: 1   OXYGEN , Inhale 1 L into the lungs at  bedtime., Disp: , Rfl:    pregabalin  (LYRICA ) 75 MG capsule, Take 1 capsule by mouth 3 times daily., Disp: 270 capsule, Rfl: 0   QUEtiapine  (SEROQUEL ) 300 MG tablet, Take 1 tablet by mouth once daily. AT BEDTIME, Disp: 90 tablet, Rfl: 1   ranolazine  (RANEXA ) 500 MG 12 hr tablet, Take 1 tablet by mouth twice daily., Disp: 60 tablet, Rfl: 11   SYMBICORT 160-4.5 MCG/ACT inhaler, Inhale 2 puffs into the lungs 2 (two) times daily., Disp: , Rfl:    topiramate  (TOPAMAX ) 100 MG tablet, Take 1 tablet by mouth 3 times daily., Disp: 270 tablet, Rfl: 0   torsemide  (DEMADEX ) 20 MG tablet, Take 1 tablet (20 mg total) by mouth daily., Disp: 90 tablet, Rfl: 3   Ubrogepant  (UBRELVY ) 100 MG TABS, Take 100 mg by mouth 2 (two) times daily as needed (migraine)., Disp: , Rfl:    Vitamin D , Ergocalciferol , (DRISDOL ) 1.25 MG (50000 UNIT) CAPS capsule, Take 1 capsule by mouth twice a week as directed, Disp: 10 capsule, Rfl: 1  Past Medical History:  Diagnosis Date   Anxiety    Depression    GERD (gastroesophageal reflux disease)    Hyperlipidemia    Hypertension    Pain in joint, lower leg    Thrombocytopenia (HCC)    Objective:  PHYSICAL EXAM:   BP 104/80   Pulse 99   Temp (!) 97.5 F (36.4 C)   Resp 16   Ht 5' (1.524 m)   Wt 240 lb 12.8 oz (109.2 kg)   LMP  (LMP Unknown)   SpO2 94%   BMI 47.03 kg/m    GEN: Well nourished, well developed, in no acute distress   Cardiac: RRR; no murmurs, rubs, or gallops,no edema -  Respiratory:  normal respiratory rate and pattern with no distress - normal breath sounds with no rales, rhonchi, wheezes or rubs  MS: no deformity or atrophy  Skin: warm and dry, no rash  Neuro:  Alert and Oriented x 3,- CN II-Xii grossly intact Psych: euthymic mood, appropriate affect and demeanor  EKG - no acute changes  Assessment & Plan:    Shortness of breath -     DG Chest 2 View; Future -     EKG 12-Lead -     CBC with Differential/Platelet -     Comprehensive  metabolic panel with GFR  Other fatigue -     CBC with  Differential/Platelet -     Comprehensive metabolic panel with GFR   Pt to make follow up with cardiology to see about getting a different type of heart monitor done  Pt to follow up with pulmonolgy - to get pulmonary function testing done if needed  Follow-up: Return for as scheduled for next chronic visit.  An After Visit Summary was printed and given to the patient.  CAMIE JONELLE NICHOLAUS DEVONNA [facility] [phone]

## 2024-03-04 LAB — COMPREHENSIVE METABOLIC PANEL WITH GFR
ALT: 22 IU/L (ref 0–32)
AST: 15 IU/L (ref 0–40)
Albumin: 4.1 g/dL (ref 3.8–4.9)
Alkaline Phosphatase: 176 IU/L — ABNORMAL HIGH (ref 44–121)
BUN/Creatinine Ratio: 13 (ref 9–23)
BUN: 16 mg/dL (ref 6–24)
Bilirubin Total: 0.6 mg/dL (ref 0.0–1.2)
CO2: 21 mmol/L (ref 20–29)
Calcium: 9 mg/dL (ref 8.7–10.2)
Chloride: 109 mmol/L — ABNORMAL HIGH (ref 96–106)
Creatinine, Ser: 1.27 mg/dL — ABNORMAL HIGH (ref 0.57–1.00)
Globulin, Total: 3.3 g/dL (ref 1.5–4.5)
Glucose: 108 mg/dL — ABNORMAL HIGH (ref 70–99)
Potassium: 3.9 mmol/L (ref 3.5–5.2)
Sodium: 146 mmol/L — ABNORMAL HIGH (ref 134–144)
Total Protein: 7.4 g/dL (ref 6.0–8.5)
eGFR: 49 mL/min/1.73 — ABNORMAL LOW (ref >59)

## 2024-03-04 LAB — CBC WITH DIFFERENTIAL/PLATELET
Basophils Absolute: 0.1 x10E3/uL (ref 0.0–0.2)
Basos: 1 %
EOS (ABSOLUTE): 0.4 x10E3/uL (ref 0.0–0.4)
Eos: 5 %
Hematocrit: 47.6 % — ABNORMAL HIGH (ref 34.0–46.6)
Hemoglobin: 15.3 g/dL (ref 11.1–15.9)
Immature Grans (Abs): 0 x10E3/uL (ref 0.0–0.1)
Immature Granulocytes: 0 %
Lymphocytes Absolute: 2.4 x10E3/uL (ref 0.7–3.1)
Lymphs: 27 %
MCH: 31.3 pg (ref 26.6–33.0)
MCHC: 32.1 g/dL (ref 31.5–35.7)
MCV: 97 fL (ref 79–97)
Monocytes Absolute: 0.8 x10E3/uL (ref 0.1–0.9)
Monocytes: 9 %
Neutrophils Absolute: 5 x10E3/uL (ref 1.4–7.0)
Neutrophils: 58 %
Platelets: 257 x10E3/uL (ref 150–450)
RBC: 4.89 x10E6/uL (ref 3.77–5.28)
RDW: 12.4 % (ref 11.7–15.4)
WBC: 8.7 x10E3/uL (ref 3.4–10.8)

## 2024-03-07 ENCOUNTER — Ambulatory Visit: Payer: Self-pay | Admitting: Physician Assistant

## 2024-03-07 ENCOUNTER — Other Ambulatory Visit: Payer: Self-pay | Admitting: Physician Assistant

## 2024-03-07 ENCOUNTER — Encounter: Payer: Self-pay | Admitting: Physician Assistant

## 2024-03-07 DIAGNOSIS — R899 Unspecified abnormal finding in specimens from other organs, systems and tissues: Secondary | ICD-10-CM

## 2024-03-08 ENCOUNTER — Telehealth: Payer: Self-pay | Admitting: Cardiology

## 2024-03-08 NOTE — Telephone Encounter (Signed)
 [facility] called in requesting pt recent echo and cath results. If unable to send cath, she said at least the echo results would be good.   Fax: [phone]

## 2024-03-08 NOTE — Telephone Encounter (Signed)
 Received called [facility] requesting echo and/or cath results. Results faxed via route feature in Epic. Staff states if they do not recieve it shortly they will reschedule the pt. No further questions expressed at this time.

## 2024-03-09 ENCOUNTER — Other Ambulatory Visit: Payer: Self-pay

## 2024-03-09 ENCOUNTER — Other Ambulatory Visit

## 2024-03-09 ENCOUNTER — Inpatient Hospital Stay: Attending: Hematology and Oncology | Admitting: Hematology and Oncology

## 2024-03-09 ENCOUNTER — Other Ambulatory Visit: Payer: Self-pay | Admitting: Physician Assistant

## 2024-03-09 ENCOUNTER — Other Ambulatory Visit: Payer: Self-pay | Admitting: Hematology and Oncology

## 2024-03-09 ENCOUNTER — Inpatient Hospital Stay

## 2024-03-09 VITALS — BP 110/84 | HR 81 | Temp 98.2°F | Resp 20 | Ht 60.0 in | Wt 236.3 lb

## 2024-03-09 DIAGNOSIS — Z83438 Family history of other disorder of lipoprotein metabolism and other lipidemia: Secondary | ICD-10-CM | POA: Diagnosis not present

## 2024-03-09 DIAGNOSIS — E559 Vitamin D deficiency, unspecified: Secondary | ICD-10-CM

## 2024-03-09 DIAGNOSIS — Z79899 Other long term (current) drug therapy: Secondary | ICD-10-CM | POA: Diagnosis not present

## 2024-03-09 DIAGNOSIS — R519 Headache, unspecified: Secondary | ICD-10-CM | POA: Diagnosis not present

## 2024-03-09 DIAGNOSIS — R7989 Other specified abnormal findings of blood chemistry: Secondary | ICD-10-CM

## 2024-03-09 DIAGNOSIS — F32A Depression, unspecified: Secondary | ICD-10-CM | POA: Insufficient documentation

## 2024-03-09 DIAGNOSIS — K219 Gastro-esophageal reflux disease without esophagitis: Secondary | ICD-10-CM | POA: Insufficient documentation

## 2024-03-09 DIAGNOSIS — Z8249 Family history of ischemic heart disease and other diseases of the circulatory system: Secondary | ICD-10-CM | POA: Insufficient documentation

## 2024-03-09 DIAGNOSIS — D582 Other hemoglobinopathies: Secondary | ICD-10-CM

## 2024-03-09 DIAGNOSIS — R748 Abnormal levels of other serum enzymes: Secondary | ICD-10-CM | POA: Diagnosis not present

## 2024-03-09 DIAGNOSIS — E785 Hyperlipidemia, unspecified: Secondary | ICD-10-CM | POA: Diagnosis not present

## 2024-03-09 DIAGNOSIS — R143 Flatulence: Secondary | ICD-10-CM | POA: Insufficient documentation

## 2024-03-09 DIAGNOSIS — Z87891 Personal history of nicotine dependence: Secondary | ICD-10-CM | POA: Diagnosis not present

## 2024-03-09 DIAGNOSIS — K760 Fatty (change of) liver, not elsewhere classified: Secondary | ICD-10-CM | POA: Insufficient documentation

## 2024-03-09 DIAGNOSIS — F419 Anxiety disorder, unspecified: Secondary | ICD-10-CM | POA: Insufficient documentation

## 2024-03-09 DIAGNOSIS — Z8349 Family history of other endocrine, nutritional and metabolic diseases: Secondary | ICD-10-CM | POA: Insufficient documentation

## 2024-03-09 DIAGNOSIS — I10 Essential (primary) hypertension: Secondary | ICD-10-CM | POA: Insufficient documentation

## 2024-03-09 DIAGNOSIS — Z882 Allergy status to sulfonamides status: Secondary | ICD-10-CM | POA: Insufficient documentation

## 2024-03-09 LAB — CBC WITH DIFFERENTIAL (CANCER CENTER ONLY)
Abs Immature Granulocytes: 0.03 K/uL (ref 0.00–0.07)
Basophils Absolute: 0.1 K/uL (ref 0.0–0.1)
Basophils Relative: 1 %
Eosinophils Absolute: 0.4 K/uL (ref 0.0–0.5)
Eosinophils Relative: 5 %
HCT: 46.4 % — ABNORMAL HIGH (ref 36.0–46.0)
Hemoglobin: 15.5 g/dL — ABNORMAL HIGH (ref 12.0–15.0)
Immature Granulocytes: 0 %
Lymphocytes Relative: 24 %
Lymphs Abs: 1.9 K/uL (ref 0.7–4.0)
MCH: 31.3 pg (ref 26.0–34.0)
MCHC: 33.4 g/dL (ref 30.0–36.0)
MCV: 93.5 fL (ref 80.0–100.0)
Monocytes Absolute: 0.6 K/uL (ref 0.1–1.0)
Monocytes Relative: 8 %
Neutro Abs: 4.8 K/uL (ref 1.7–7.7)
Neutrophils Relative %: 62 %
Platelet Count: 240 K/uL (ref 150–400)
RBC: 4.96 MIL/uL (ref 3.87–5.11)
RDW: 13.8 % (ref 11.5–15.5)
WBC Count: 7.8 K/uL (ref 4.0–10.5)
nRBC: 0 % (ref 0.0–0.2)

## 2024-03-09 LAB — TSH: TSH: 1.629 u[IU]/mL (ref 0.350–4.500)

## 2024-03-09 LAB — FERRITIN: Ferritin: 63 ng/mL (ref 11–307)

## 2024-03-09 NOTE — Progress Notes (Deleted)
 [facility] [address] [place],  [place]  [address] [phone]  Clinic Day:  03/09/2024   Referring physician: Nicholaus Credit, PA-C  Patient Care Team: Patient Care Team: Nicholaus Credit, DEVONNA as PCP - General (Physician Assistant) Sheena Pugh, DO as PCP - Cardiology (Cardiology)   REASON FOR CONSULTATION:    HISTORY OF PRESENT ILLNESS:   Cindy Hammond is a 58 y.o. female with a history of *** who is referred in consultation by {REFERRING PHYSICIAN} for assessment and management. ***   REVIEW OF SYSTEMS:   Review of Systems - Oncology   VITALS:   There were no vitals taken for this visit.  Wt Readings from Last 3 Encounters:  03/03/24 240 lb 12.8 oz (109.2 kg)  02/24/24 238 lb 8 oz (108.2 kg)  02/16/24 237 lb (107.5 kg)    There is no height or weight on file to calculate BMI.  Performance status (ECOG): {CHL ONC [id number]  PHYSICAL EXAM:   Physical Exam   LABS:      Latest Ref Rng & Units 03/03/2024    4:02 PM 02/24/2024    2:13 PM 02/16/2024    3:08 PM  CBC  WBC 3.4 - 10.8 x10E3/uL 8.7  8.0  9.4   Hemoglobin 11.1 - 15.9 g/dL 84.6  85.6  84.5   Hematocrit 34.0 - 46.6 % 47.6  43.8  47.0   Platelets 150 - 450 x10E3/uL 257  255  287       Latest Ref Rng & Units 03/03/2024    4:02 PM 02/24/2024    2:13 PM 02/16/2024    3:08 PM  CMP  Glucose 70 - 99 mg/dL 891  89  883   BUN 6 - 24 mg/dL 16  18  18    Creatinine 0.57 - 1.00 mg/dL 8.72  8.92  8.85   Sodium 134 - 144 mmol/L 146  141  144   Potassium 3.5 - 5.2 mmol/L 3.9  4.2  4.0   Chloride 96 - 106 mmol/L 109  111  107   CO2 20 - 29 mmol/L 21  20  20    Calcium  8.7 - 10.2 mg/dL 9.0  9.6  9.8   Total Protein 6.0 - 8.5 g/dL 7.4  7.1  7.4   Total Bilirubin 0.0 - 1.2 mg/dL 0.6  0.5  0.5   Alkaline Phos 44 - 121 IU/L 176  172  214   AST 0 - 40 IU/L 15  29  36   ALT 0 - 32 IU/L 22  43  65      No results found for: CEA1, CEA / No results found for: CEA1, CEA No results found for:  PSA1 No results found for: CAN199 No results found for: CAN125  No results found for: STEPHANY RINGS, A1GS, A2GS, BETS, BETA2SER, GAMS, MSPIKE, SPEI Lab Results  Component Value Date   TIBC 413 02/24/2024   TIBC 312 05/14/2023   FERRITIN 99 02/24/2024   FERRITIN 503 (H) 02/01/2024   FERRITIN 123 05/14/2023   IRONPCTSAT 25 02/24/2024   IRONPCTSAT 30 05/14/2023   No results found for: LDH  STUDIES:   US  Abdomen Limited RUQ (LIVER/GB) Result Date: 02/24/2024 CLINICAL DATA:  Elevated liver enzymes. EXAM: ULTRASOUND ABDOMEN LIMITED RIGHT UPPER QUADRANT COMPARISON:  March 16, 2019 FINDINGS: Gallbladder: No gallstones or wall thickening visualized. No sonographic Murphy sign noted by sonographer. Common bile duct: Diameter: 4 mm. Liver: No focal lesion identified. Mild increased echotexture. Portal vein is  patent on color Doppler imaging with normal direction of blood flow towards the liver. Other: None. IMPRESSION: 1. No acute abnormality identified. 2. Mild increased echotexture of the liver. This is nonspecific but can be seen in fatty infiltration of liver. Electronically Signed   By: Craig Farr M.D.   On: 02/24/2024 10:54      HISTORY:   Past Medical History:  Diagnosis Date  . Anxiety   . Depression   . GERD (gastroesophageal reflux disease)   . Hyperlipidemia   . Hypertension   . Pain in joint, lower leg   . Thrombocytopenia (HCC)     Past Surgical History:  Procedure Laterality Date  . RIGHT/LEFT HEART CATH AND CORONARY ANGIOGRAPHY N/A 01/12/2024   Procedure: RIGHT/LEFT HEART CATH AND CORONARY ANGIOGRAPHY;  Surgeon: Verlin Lonni BIRCH, MD;  Location: [facility];  Service: Cardiovascular;  Laterality: N/A;  . TUBAL LIGATION      Family History  Problem Relation Age of Onset  . Hyperlipidemia Mother   . Hypertension Mother   . Hyperlipidemia Father   . Hypertension Father     Social History:  reports that she quit  smoking about 25 years ago. Her smoking use included cigarettes. She started smoking about 35 years ago. She has a 5 pack-year smoking history. She has never used smokeless tobacco. She reports that she does not drink alcohol and does not use drugs.The patient is {Blank single:19197::alone,accompanied by} *** today.  Allergies:  Allergies  Allergen Reactions  . Sumatriptan Hives and Rash  . Sulfa Antibiotics     Current Medications: Current Outpatient Medications  Medication Sig Dispense Refill  . albuterol  (VENTOLIN  HFA) 108 (90 Base) MCG/ACT inhaler INHALE 1-2 PUFFS INTO THE LUNGS EVERY 6 HOURS AS NEEDED FOR WHEEZING OR SHORTNESS OF BREATH 8.5 g 2  . amitriptyline  (ELAVIL ) 150 MG tablet TAKE 1 TABLET BY MOUTH DAILY AT BEDTIME. 90 tablet 0  . atenolol  (TENORMIN ) 25 MG tablet Take 1 tablet (25 mg total) by mouth 2 (two) times daily. 180 tablet 2  . atorvastatin  (LIPITOR) 80 MG tablet Take 1 tablet by mouth ONCE daily. 90 tablet 1  . clonazePAM  (KLONOPIN ) 0.5 MG tablet TAKE ONE TABLET BY MOUTH TWICE DAILY 60 tablet 1  . EMGALITY 120 MG/ML SOAJ SMARTSIG:2 Milliliter(s) SUB-Q Once    . KRILL OIL PO Take 1 capsule by mouth daily.    . meloxicam  (MOBIC ) 15 MG tablet Take 1 tablet (15 mg total) by mouth daily. 30 tablet 2  . montelukast (SINGULAIR) 10 MG tablet Take 10 mg by mouth daily.    . nitroGLYCERIN  (NITROSTAT ) 0.4 MG SL tablet PLACE 1 TABLET UNDER THE TONGUE AS NEEDED FOR CHEST PAIN ( MAY REPEAT EVERY5 MINUTES X 3) 25 tablet 3  . omeprazole  (PRILOSEC) 40 MG capsule Take 1 capsule by mouth once daily. (Patient taking differently: Take 40 mg by mouth daily as needed (Heartburn).) 90 capsule 1  . OXYGEN  Inhale 1 L into the lungs at bedtime.    . pregabalin  (LYRICA ) 75 MG capsule Take 1 capsule by mouth 3 times daily. 270 capsule 0  . QUEtiapine  (SEROQUEL ) 300 MG tablet Take 1 tablet by mouth once daily. AT BEDTIME 90 tablet 1  . ranolazine  (RANEXA ) 500 MG 12 hr tablet Take 1 tablet by  mouth twice daily. 60 tablet 11  . SYMBICORT 160-4.5 MCG/ACT inhaler Inhale 2 puffs into the lungs 2 (two) times daily.    . topiramate  (TOPAMAX ) 100 MG tablet Take 1 tablet  by mouth 3 times daily. 270 tablet 0  . torsemide  (DEMADEX ) 20 MG tablet Take 1 tablet (20 mg total) by mouth daily. 90 tablet 3  . Ubrogepant  (UBRELVY ) 100 MG TABS Take 100 mg by mouth 2 (two) times daily as needed (migraine).    . Vitamin D , Ergocalciferol , (DRISDOL ) 1.25 MG (50000 UNIT) CAPS capsule Take 1 capsule by mouth twice a week as directed 10 capsule 1   No current facility-administered medications for this visit.     ASSESSMENT & PLAN:   Assessment:  Cindy Hammond is a 58 y.o. female ***  Plan: 1.  ***  I discussed the assessment and treatment plan with the patient.  The patient was provided an opportunity to ask questions and all were answered.  The patient agreed with the plan and demonstrated an understanding of the instructions.    Thank you for the referral    *** minutes was spent in patient care.  This included time spent preparing to see the patient (e.g., review of tests), obtaining and/or reviewing separately obtained history, counseling and educating the patient/family/caregiver, ordering medications, tests, or procedures; documenting clinical information in the electronic or other health record, independently interpreting results and communicating results to the patient/family/caregiver as well as coordination of care.      Cindy DELENA Bach, NP   Physician Assistant [facility] [place] [phone]

## 2024-03-11 DIAGNOSIS — J4540 Moderate persistent asthma, uncomplicated: Secondary | ICD-10-CM | POA: Diagnosis not present

## 2024-03-13 ENCOUNTER — Encounter: Payer: Self-pay | Admitting: Cardiology

## 2024-03-15 DIAGNOSIS — R42 Dizziness and giddiness: Secondary | ICD-10-CM

## 2024-03-16 ENCOUNTER — Other Ambulatory Visit: Payer: Self-pay | Admitting: Physician Assistant

## 2024-03-17 ENCOUNTER — Other Ambulatory Visit

## 2024-03-17 DIAGNOSIS — R899 Unspecified abnormal finding in specimens from other organs, systems and tissues: Secondary | ICD-10-CM

## 2024-03-18 ENCOUNTER — Ambulatory Visit: Payer: Self-pay | Admitting: Physician Assistant

## 2024-03-18 ENCOUNTER — Ambulatory Visit: Payer: Self-pay | Admitting: Cardiology

## 2024-03-18 LAB — COMPREHENSIVE METABOLIC PANEL WITH GFR
ALT: 32 IU/L (ref 0–32)
AST: 31 IU/L (ref 0–40)
Albumin: 4 g/dL (ref 3.8–4.9)
Alkaline Phosphatase: 192 IU/L — ABNORMAL HIGH (ref 44–121)
BUN/Creatinine Ratio: 14 (ref 9–23)
BUN: 16 mg/dL (ref 6–24)
Bilirubin Total: 0.4 mg/dL (ref 0.0–1.2)
CO2: 21 mmol/L (ref 20–29)
Calcium: 9.1 mg/dL (ref 8.7–10.2)
Chloride: 110 mmol/L — ABNORMAL HIGH (ref 96–106)
Creatinine, Ser: 1.18 mg/dL — ABNORMAL HIGH (ref 0.57–1.00)
Globulin, Total: 3.1 g/dL (ref 1.5–4.5)
Glucose: 95 mg/dL (ref 70–99)
Potassium: 4 mmol/L (ref 3.5–5.2)
Sodium: 144 mmol/L (ref 134–144)
Total Protein: 7.1 g/dL (ref 6.0–8.5)
eGFR: 54 mL/min/1.73 — ABNORMAL LOW (ref >59)

## 2024-03-21 ENCOUNTER — Other Ambulatory Visit: Payer: Self-pay

## 2024-03-21 ENCOUNTER — Other Ambulatory Visit: Payer: Self-pay | Admitting: Physician Assistant

## 2024-03-21 DIAGNOSIS — R0602 Shortness of breath: Secondary | ICD-10-CM

## 2024-03-21 DIAGNOSIS — G4733 Obstructive sleep apnea (adult) (pediatric): Secondary | ICD-10-CM

## 2024-03-21 NOTE — Progress Notes (Signed)
 Pulmonary referral placed.

## 2024-03-21 NOTE — Progress Notes (Signed)
 Ellouise Console, PA-C [address] [place], [place]  [address] Phone: [phone]   Gastroenterology Consultation  Referring Provider:     Nicholaus Credit, PA-C Primary Care Physician:  Nicholaus Credit, PA-C Primary Gastroenterologist:  Ellouise Console, PA-C / Elspeth Naval, MD  Reason for Consultation:     Elevated LFTs, MASLD        HPI:   Cindy Hammond is a 58 y.o. y/o female referred for consultation & management  by Nicholaus Credit, PA-C.  New patient to our office.  Previous patient of Dr. Towana at [facility].  Symptoms:  Has mild Constipation and occasional bilateral lower abdominal pain.  She has bilateral abdominal hernias.  No other GI symptoms.  She denies nausea, vomiting, or upper abdominal pain.  Struggles with morbid obesity (BMI 46.8).  New meds: Started Wegovy  10/2023 and stopped 12/2023.  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 No OTC supplements.  She does not take acetaminophen  or opioids.  She is on statin. No Alcohol.  She drank alcohol 1 time in her 20's and None since. No Recent surgery. No Family history of liver disease.  10/2020 colonoscopy: 2 small 1 mm polyps removed from sigmoid colon.  Good prep with with 2-day Sutab.  10-year repeat (10/2030).  PMH: CAD, HTN, asthma, sleep apnea, GERD, MASH, CKD, OA, thrombocytopenia, hyperlipidemia, obesity, fibromyalgia.  Uses 1 L oxygen  at bedtime.  12/2022 LVEF 60 to 65%.  Labs: Component Ref Range & Units (hover) 4 d ago (03/17/24) 2 wk ago (03/03/24) 3 wk ago (02/24/24) 1 mo ago (02/16/24) 1 mo ago (01/26/24)  Glucose 95 108 High  89 CM 116 High  90  BUN 16 16 18  R 18 18  Creatinine, Ser 1.18 High  1.27 High  1.07 High  R 1.14 High  1.40 High   eGFR 54 Low  49 Low   56 Low  44 Low   Sodium 144 146 High  141 R 144 144  Potassium 4.0 3.9 4.2 R 4.0 4.0  Chloride 110 High  109 High  111 R 107 High  105  CO2 21 21 20  Low  R 20 19 Low   Calcium  9.1 9.0 9.6 R 9.8 9.6  Total Protein 7.1 7.4 7.1 R 7.4 7.7  Albumin  4.0 4.1 3.9 R 4.2 4.3  Bilirubin Total 0.4 0.6 0.5 0.5 0.9  Alkaline Phosphatase 192 High  176 High  172 High  R 214 High  291 High   AST 31 15 29  R 36 252 High Panic  CM  ALT 32 22 43 R 65 High  275 High Panic  CM   Component Ref Range & Units (hover) 1 mo ago (01/26/24) 2 mo ago (01/21/24) 2 mo ago (01/05/24) 3 mo ago (12/15/23) 6 mo ago (08/27/23)  Bilirubin Total 0.9 0.6 0.5 0.6 0.4  Alkaline Phosphatase 291 High  202 High  218 High  163 High  156 High   AST 252 High Panic  148 High  67 High  41 High  19  ALT 275 High Panic  153 High  78 High  58 High  26     Component Ref Range & Units (hover) 12 d ago (03/09/24) 2 wk ago (03/03/24) 3 wk ago (02/24/24) 1 mo ago (02/16/24) 2 mo ago (01/21/24)  WBC Count 7.8 8.7 R 8.0 9.4 R 8.3 R  RBC 4.96 4.89 R 4.60 4.85 R 4.52 R  Hemoglobin 15.5 High  15.3 R 14.3 15.4 R  13.9 R  HCT 46.4 High  47.6 High  R 43.8 47.0 High  R 43.1 R  MCV 93.5 97 R 95.2 97 R 95 R  RDW 13.8 12.4 R 13.5 12.3 R 12.7 R  Platelet Count 240 257 R 255 287 R 237 R   01/2024 liver labs: Elevated ferritin 503.  Normal total iron 104, iron saturation 25%.  Hemochromatosis DNA PCR gene not detected.  Normal TSH.  Normal B12 and folate.  ANA and AMA negative.  HIV nonreactive.  Hep C nonreactive.  Negative hepatitis B and surface antigen negative, not immune.     02/24/2024 RUQ ultrasound: 1. No acute abnormality identified. 2. Mild increased echotexture of the liver. This is nonspecific but can be seen in fatty infiltration of liver.   Past Medical History:  Diagnosis Date   Anxiety    Depression    GERD (gastroesophageal reflux disease)    Hyperlipidemia    Hypertension    Pain in joint, lower leg    Thrombocytopenia (HCC)     Past Surgical History:  Procedure Laterality Date   RIGHT/LEFT HEART CATH AND CORONARY ANGIOGRAPHY N/A 01/12/2024   Procedure: RIGHT/LEFT HEART CATH AND CORONARY ANGIOGRAPHY;  Surgeon: Verlin Lonni BIRCH, MD;  Location: [facility];   Service: Cardiovascular;  Laterality: N/A;   TUBAL LIGATION      Prior to Admission medications   Medication Sig Start Date End Date Taking? Authorizing Provider  albuterol  (VENTOLIN  HFA) 108 (90 Base) MCG/ACT inhaler INHALE 1-2 PUFFS INTO THE LUNGS EVERY 6 HOURS AS NEEDED FOR WHEEZING OR SHORTNESS OF BREATH 10/28/22   Nicholaus Credit, PA-C  amitriptyline  (ELAVIL ) 150 MG tablet TAKE 1 TABLET BY MOUTH DAILY AT BEDTIME. 02/25/24   Cox, Abigail, MD  atenolol  (TENORMIN ) 25 MG tablet Take 1 tablet (25 mg total) by mouth 2 (two) times daily. 09/30/23   Tobb, Kardie, DO  atorvastatin  (LIPITOR) 80 MG tablet Take 1 tablet by mouth ONCE daily. 03/21/24   Nicholaus Credit, PA-C  clonazePAM  (KLONOPIN ) 0.5 MG tablet TAKE ONE TABLET BY MOUTH TWICE DAILY 03/01/24   Cox, Kirsten, MD  EMGALITY 120 MG/ML SOAJ SMARTSIG:2 Milliliter(s) SUB-Q Once 02/17/24   [provider]  KRILL OIL PO Take 1 capsule by mouth daily.    [provider]  meloxicam  (MOBIC ) 15 MG tablet Take 1 tablet (15 mg total) by mouth daily. 03/16/24   Nicholaus Credit, PA-C  montelukast (SINGULAIR) 10 MG tablet Take 10 mg by mouth daily. 10/27/23   [provider]  nitroGLYCERIN  (NITROSTAT ) 0.4 MG SL tablet PLACE 1 TABLET UNDER THE TONGUE AS NEEDED FOR CHEST PAIN ( MAY REPEAT EVERY5 MINUTES X 3) 08/28/21   Tobb, Kardie, DO  omeprazole  (PRILOSEC) 40 MG capsule Take 1 capsule by mouth once daily. Patient taking differently: Take 40 mg by mouth daily as needed (Heartburn). 09/29/23   Nicholaus Credit, PA-C  OXYGEN  Inhale 1 L into the lungs at bedtime.    [provider]  pregabalin  (LYRICA ) 75 MG capsule Take 1 capsule by mouth 3 times daily. 02/16/24   Nicholaus Credit, PA-C  QUEtiapine  (SEROQUEL ) 300 MG tablet Take 1 tablet by mouth once daily. AT BEDTIME 10/26/23   Nicholaus Credit, PA-C  ranolazine  (RANEXA ) 500 MG 12 hr tablet Take 1 tablet by mouth twice daily. 09/30/23   Tobb, Kardie, DO  SYMBICORT 160-4.5 MCG/ACT inhaler Inhale 2 puffs into the  lungs 2 (two) times daily. 12/23/23   [provider]  topiramate  (TOPAMAX ) 100 MG  tablet Take 1 tablet by mouth 3 times daily. 12/24/23   Nicholaus Credit, PA-C  torsemide  (DEMADEX ) 20 MG tablet Take 1 tablet (20 mg total) by mouth daily. 01/05/24   Tobb, Kardie, DO  Ubrogepant  (UBRELVY ) 100 MG TABS Take 100 mg by mouth 2 (two) times daily as needed (migraine).    [provider]  Vitamin D , Ergocalciferol , (DRISDOL ) 1.25 MG (50000 UNIT) CAPS capsule Take 1 capsule by mouth twice a week as directed 03/10/24   Nicholaus Credit, PA-C    Family History  Problem Relation Age of Onset   Hyperlipidemia Mother    Hypertension Mother    Hyperlipidemia Father    Hypertension Father      Social History   Tobacco Use   Smoking status: Former    Current packs/day: 0.00    Average packs/day: 0.5 packs/day for 10.0 years (5.0 ttl pk-yrs)    Types: Cigarettes    Start date: 37    Quit date: 2000    Years since quitting: 25.5   Smokeless tobacco: Never  Vaping Use   Vaping status: Never Used  Substance Use Topics   Alcohol use: No    Alcohol/week: 0.0 standard drinks of alcohol   Drug use: No    Allergies as of 03/22/2024 - Review Complete 03/22/2024  Allergen Reaction Noted   Sumatriptan Hives and Rash 10/03/2014   Sulfa antibiotics  03/30/2014    Review of Systems:    All systems reviewed and negative except where noted in HPI.   Physical Exam:  BP 90/70   Pulse 82   Ht 5' (1.524 m)   Wt 240 lb (108.9 kg)   LMP  (LMP Unknown)   BMI 46.87 kg/m  No LMP recorded (lmp unknown). Patient is postmenopausal.  General:   Alert,  Well-developed, morbidly obese, pleasant and cooperative in NAD Lungs:  Respirations even and unlabored.  Clear throughout to auscultation.   No wheezes, crackles, or rhonchi. No acute distress. Heart:  Regular rate and rhythm; no murmurs, clicks, rubs, or gallops. Abdomen:  Normal bowel sounds.  No bruits.  Soft, and very obese without masses,  hepatosplenomegaly.  Large pannus lower abdomen.  No Tenderness.  No guarding or rebound tenderness.    Neurologic:  Alert and oriented x3;  grossly normal neurologically. Psych:  Alert and cooperative. Normal mood and affect. Skin: No jaundice  Imaging Studies: Cardiac event monitor Result Date: 03/15/2024 The patient wore the monitor for 30 days starting Jan 20, 2024. Indication: Dizziness The minimum heart rate was 66 bpm, maximum heart rate was   bpm, and average heart rate was 84 bpm. Predominant underlying rhythm was Sinus Rhythm. Premature atrial complexes were Premature Ventricular complexes No pauses, No AV block and no atrial fibrillation present. Symptoms associated with sinus rhythm Conclusion: Normal/unremarkable study     Assessment and Plan:   Cindy Hammond is a 58 y.o. y/o female has been referred for moderate transiently acute elevated LFTs in May 2025.  Liver transaminases have improved to normal.  Alk phos still elevated, yet improved.  Acute hepatitis of uncertain etiology.  Possible drug-induced liver injury from Wegovy ?  Acute hepatitis B and C labs negative.  She has not had hepatitis A labs.  She does not drink alcohol.  Recent RUQ ultrasound showed mild steatosis, but no gallstones.  Normal bile duct 4 mm.  Bilirubin has been normal.    Labs negative for hemochromatosis, autoimmune immune hepatitis, and primary biliary cirrhosis.  Ordering more labs for  follow-up.  She is not immune to hepatitis B.  Differential includes drug-induced liver injury, viral hepatitis A.    1.  Transient acute moderately elevated liver transaminases and alkaline phosphatase, trending down.  Transaminases returned to normal.  Alk phos trending down, yet still chronically elevated.  Bilirubin has been normal. - Lab: Hepatitis A Total and IgM, Alkaline phosphatase isoenzymes, GGT, intact PTH and calcium , hepatic panel, ANA, AMA, ASMA - She is not immune to hepatitis B. - If hepatitis A labs showed no  immunity, then I will schedule her hepatitis A and B vaccines. - Remain off Wegovy  for now.  2.  MASLD / hepatic steatosis -  Fibrosis 4 Score = 1.3 (Indeterminate)      Interpretation for patients with NAFLD          <1.30       -  F0-F1 (Low risk)          1.30-2.67 -  Indeterminate           >2.67      -  F3-F4 (High risk)     Validated for ages 74-65 - Schedule liver ultrasound with elastography. - Recommend a low-fat diet, regular exercise, and weight loss. - Patient education handout about fatty liver disease was given. - Low-fat diet handout was given.  - I discussed Rezdiffra medication to treat fatty liver disease with patient.  It is Only approved in patients who have F2 or F3 liver fibrosis.       Follow up 3 months with TG  Ellouise Console, PA-C

## 2024-03-22 ENCOUNTER — Other Ambulatory Visit (INDEPENDENT_AMBULATORY_CARE_PROVIDER_SITE_OTHER)

## 2024-03-22 ENCOUNTER — Other Ambulatory Visit: Payer: Self-pay | Admitting: Cardiology

## 2024-03-22 ENCOUNTER — Ambulatory Visit: Admitting: Physician Assistant

## 2024-03-22 ENCOUNTER — Encounter: Payer: Self-pay | Admitting: Physician Assistant

## 2024-03-22 VITALS — BP 90/70 | HR 82 | Ht 60.0 in | Wt 240.0 lb

## 2024-03-22 DIAGNOSIS — K5900 Constipation, unspecified: Secondary | ICD-10-CM

## 2024-03-22 DIAGNOSIS — K760 Fatty (change of) liver, not elsewhere classified: Secondary | ICD-10-CM | POA: Diagnosis not present

## 2024-03-22 DIAGNOSIS — R7401 Elevation of levels of liver transaminase levels: Secondary | ICD-10-CM

## 2024-03-22 DIAGNOSIS — R748 Abnormal levels of other serum enzymes: Secondary | ICD-10-CM | POA: Diagnosis not present

## 2024-03-22 LAB — HEPATIC FUNCTION PANEL
ALT: 34 U/L (ref 0–35)
AST: 23 U/L (ref 0–37)
Albumin: 3.9 g/dL (ref 3.5–5.2)
Alkaline Phosphatase: 165 U/L — ABNORMAL HIGH (ref 39–117)
Bilirubin, Direct: 0 mg/dL (ref 0.0–0.3)
Total Bilirubin: 0.7 mg/dL (ref 0.2–1.2)
Total Protein: 7.2 g/dL (ref 6.0–8.3)

## 2024-03-22 LAB — GAMMA GT: GGT: 78 U/L — ABNORMAL HIGH (ref 7–51)

## 2024-03-22 LAB — VITAMIN D 25 HYDROXY (VIT D DEFICIENCY, FRACTURES): VITD: 72.64 ng/mL (ref 30.00–100.00)

## 2024-03-22 NOTE — Patient Instructions (Addendum)
 Your provider has requested that you go to the basement level for lab work before leaving today. Press B on the elevator. The lab is located at the first door on the left as you exit the elevator.  You have been scheduled for an abdominal ultrasound at [facility] (1st floor of hospital) on 03/29/24 at 9:30 am. Please arrive 30 minutes prior to your appointment for registration. Make certain not to have anything to eat or drink 6 hours prior to your appointment. Should you need to reschedule your appointment, please contact radiology at [phone]. This test typically takes about 30 minutes to perform.  Please follow up sooner if symptoms increase or worsen  Due to recent changes in healthcare laws, you may see the results of your imaging and laboratory studies on MyChart before your provider has had a chance to review them.  We understand that in some cases there may be results that are confusing or concerning to you. Not all laboratory results come back in the same time frame and the provider may be waiting for multiple results in order to interpret others.  Please give us  48 hours in order for your provider to thoroughly review all the results before contacting the office for clarification of your results.   Thank you for trusting me with your gastrointestinal care!   Ellouise Console, PA-C _______________________________________________________  If your blood pressure at your visit was 140/90 or greater, please contact your primary care physician to follow up on this.  _______________________________________________________  If you are age 14 or older, your body mass index should be between 23-30. Your Body mass index is 46.87 kg/m. If this is out of the aforementioned range listed, please consider follow up with your Primary Care Provider.  If you are age 71 or younger, your body mass index should be between 19-25. Your Body mass index is 46.87 kg/m. If this is out of the  aformentioned range listed, please consider follow up with your Primary Care Provider.   ________________________________________________________  The [facility] providers would like to encourage you to use MYCHART to communicate with providers for non-urgent requests or questions.  Due to long hold times on the telephone, sending your provider a message by [facility] may be a faster and more efficient way to get a response.  Please allow 48 business hours for a response.  Please remember that this is for non-urgent requests.  _______________________________________________________

## 2024-03-22 NOTE — Progress Notes (Signed)
 Agree with assessment and plan as outlined. Given rise and fall of enzymes in that pattern, certainly suspicious for DILI. Will await further serologic workup to ensure negative, continue to trend enzymes periodically until normal.

## 2024-03-24 ENCOUNTER — Other Ambulatory Visit: Payer: Self-pay | Admitting: Physician Assistant

## 2024-03-26 LAB — ALKALINE PHOSPHATASE ISOENZYMES
Alkaline phosphatase (APISO): 166 U/L — ABNORMAL HIGH (ref 37–153)
Bone Isoenzymes: 21 % — ABNORMAL LOW (ref 28–66)
Intestinal Isoenzymes: 10 % (ref 1–24)
Liver Isoenzymes: 69 % (ref 25–69)
Macrohepatic isoenzymes: 0 % (ref <=0)
Placental isoenzymes: 0 % (ref <=0)

## 2024-03-26 LAB — ANA: Anti Nuclear Antibody (ANA): NEGATIVE

## 2024-03-26 LAB — ANTI-SMOOTH MUSCLE ANTIBODY, IGG: Actin (Smooth Muscle) Antibody (IGG): 23 U — ABNORMAL HIGH (ref <20)

## 2024-03-26 LAB — PTH, INTACT AND CALCIUM
Calcium: 9.7 mg/dL (ref 8.6–10.4)
PTH: 80 pg/mL — ABNORMAL HIGH (ref 16–77)

## 2024-03-26 LAB — MITOCHONDRIAL ANTIBODIES: Mitochondrial M2 Ab, IgG: <=20 U (ref <=20.0)

## 2024-03-26 LAB — HEPATITIS A ANTIBODY, IGM: Hep A IgM: NONREACTIVE

## 2024-03-26 LAB — HEPATITIS A ANTIBODY, TOTAL: Hepatitis A AB,Total: REACTIVE — AB

## 2024-03-28 ENCOUNTER — Ambulatory Visit: Payer: Self-pay | Admitting: Physician Assistant

## 2024-03-29 ENCOUNTER — Ambulatory Visit (INDEPENDENT_AMBULATORY_CARE_PROVIDER_SITE_OTHER)
Admission: RE | Admit: 2024-03-29 | Discharge: 2024-03-29 | Disposition: A | Discharge status: Home / Self Care | Source: Ambulatory Visit | Attending: Physician Assistant | Admitting: Physician Assistant

## 2024-03-29 ENCOUNTER — Other Ambulatory Visit: Payer: Self-pay | Admitting: Physician Assistant

## 2024-03-29 DIAGNOSIS — R7401 Elevation of levels of liver transaminase levels: Secondary | ICD-10-CM

## 2024-03-29 DIAGNOSIS — K760 Fatty (change of) liver, not elsewhere classified: Secondary | ICD-10-CM

## 2024-03-29 DIAGNOSIS — R748 Abnormal levels of other serum enzymes: Secondary | ICD-10-CM

## 2024-03-29 DIAGNOSIS — K5900 Constipation, unspecified: Secondary | ICD-10-CM

## 2024-03-30 ENCOUNTER — Ambulatory Visit: Payer: Self-pay | Admitting: Physician Assistant

## 2024-03-30 ENCOUNTER — Encounter: Payer: Self-pay | Admitting: Pharmacist Clinician (PhC)/ Clinical Pharmacy Specialist

## 2024-03-30 DIAGNOSIS — N1831 Chronic kidney disease, stage 3a: Secondary | ICD-10-CM | POA: Diagnosis not present

## 2024-03-30 DIAGNOSIS — I129 Hypertensive chronic kidney disease with stage 1 through stage 4 chronic kidney disease, or unspecified chronic kidney disease: Secondary | ICD-10-CM | POA: Diagnosis not present

## 2024-03-30 DIAGNOSIS — N1832 Chronic kidney disease, stage 3b: Secondary | ICD-10-CM | POA: Diagnosis not present

## 2024-03-30 DIAGNOSIS — Q614 Renal dysplasia: Secondary | ICD-10-CM | POA: Diagnosis not present

## 2024-04-01 ENCOUNTER — Encounter: Payer: Self-pay | Admitting: Physician Assistant

## 2024-04-04 LAB — LAB REPORT - SCANNED
Albumin, Urine POC: 66
Creatinine, POC: 39 mg/dL
EGFR: 52
Microalb Creat Ratio: 169

## 2024-04-04 NOTE — Progress Notes (Signed)
 [facility] [address] [place],  [place]  [address] [phone]  Clinic Day:  03/09/2024   Referring physician: Nicholaus Credit, PA-C  Patient Care Team: Patient Care Team: Nicholaus Credit, DEVONNA as PCP - General (Physician Assistant) Tobb, Kardie, DO as PCP - Cardiology (Cardiology)   REASON FOR CONSULTATION:  Elevated Ferritin  HISTORY OF PRESENT ILLNESS:   Cindy Hammond is a 58 y.o. female with a history of elevated ferritin who is referred in consultation by Credit Nicholaus for assessment and management. She presented to PCP for possible UTI and was found to have elevated liver enzymes with Alk phos 214 and AST 65. Further studies revealed an elevated ferritin at 503.  Medical history is significant for depression, GERD, hyperlipidemia, hypertension, pain in joint,lower leg, thrombocytopenia, stage IIIB kidney disease, NASH and chronic migraines. Surgical history is significant for heart cath and tubal ligation. Family history is significant for hyperlipidemia and hypertension. She is married with 3 children. She is a former smoker having smoked 0.5 pack per day for 10 years, denies use of alcohol or drugs. She denies fever, chills, nausea or vomiting. She denies shortness of breath, chest pain or cough. She denies issue with bowel or bladder. Her appetite is the same with no changes to her weight.    REVIEW OF SYSTEMS:   Review of Systems  Constitutional: Negative.   HENT:  Negative.    Eyes: Negative.   Respiratory: Negative.    Cardiovascular: Negative.   Gastrointestinal: Negative.   Endocrine: Negative.   Genitourinary: Negative.    Musculoskeletal: Negative.   Skin: Negative.   Neurological:  Positive for headaches.  Hematological: Negative.   Psychiatric/Behavioral: Negative.       VITALS:   Blood pressure 110/84, pulse 81, temperature 98.2 F (36.8 C), temperature source Oral, resp. rate 20, height 5' (1.524 m), weight 236 lb 4.8 oz (107.2 kg), SpO2  90%.  Wt Readings from Last 3 Encounters:  03/22/24 240 lb (108.9 kg)  03/09/24 236 lb 4.8 oz (107.2 kg)  03/03/24 240 lb 12.8 oz (109.2 kg)    Body mass index is 46.15 kg/m.  Performance status (ECOG): 1 - Symptomatic but completely ambulatory  PHYSICAL EXAM:   Physical Exam Constitutional:      Appearance: Normal appearance. She is normal weight.  HENT:     Head: Normocephalic and atraumatic.     Mouth/Throat:     Mouth: Mucous membranes are dry.  Cardiovascular:     Rate and Rhythm: Normal rate and regular rhythm.     Pulses: Normal pulses.     Heart sounds: Normal heart sounds.  Pulmonary:     Effort: Pulmonary effort is normal.     Breath sounds: Normal breath sounds.  Abdominal:     Palpations: Abdomen is soft.  Musculoskeletal:        General: Normal range of motion.     Cervical back: Normal range of motion.  Skin:    General: Skin is warm and dry.  Neurological:     General: No focal deficit present.     Mental Status: She is alert and oriented to person, place, and time. Mental status is at baseline.  Psychiatric:        Mood and Affect: Mood normal.        Behavior: Behavior normal.        Thought Content: Thought content normal.        Judgment: Judgment normal.      LABS:  Latest Ref Rng & Units 03/09/2024    1:59 PM 03/03/2024    4:02 PM 02/24/2024    2:13 PM  CBC  WBC 4.0 - 10.5 K/uL 7.8  8.7  8.0   Hemoglobin 12.0 - 15.0 g/dL 84.4  84.6  85.6   Hematocrit 36.0 - 46.0 % 46.4  47.6  43.8   Platelets 150 - 400 K/uL 240  257  255       Latest Ref Rng & Units 03/22/2024    9:32 AM 03/17/2024    1:41 PM 03/03/2024    4:02 PM  CMP  Glucose 70 - 99 mg/dL  95  891   BUN 6 - 24 mg/dL  16  16   Creatinine 9.42 - 1.00 mg/dL  8.81  8.72   Sodium 865 - 144 mmol/L  144  146   Potassium 3.5 - 5.2 mmol/L  4.0  3.9   Chloride 96 - 106 mmol/L  110  109   CO2 20 - 29 mmol/L  21  21   Calcium  8.6 - 10.4 mg/dL 9.7  9.1  9.0   Total Protein 6.0 - 8.3 g/dL  7.2  7.1  7.4   Total Bilirubin 0.2 - 1.2 mg/dL 0.7  0.4  0.6   Alkaline Phos 39 - 117 U/L 165  192  176   AST 0 - 37 U/L 23  31  15    ALT 0 - 35 U/L 34  32  22      No results found for: CEA1, CEA / No results found for: CEA1, CEA No results found for: PSA1 No results found for: CAN199 No results found for: CAN125  No results found for: STEPHANY RINGS, A1GS, A2GS, EARLA JOANNIE KNIGHTS, MSPIKE, SPEI Lab Results  Component Value Date   TIBC 413 02/24/2024   TIBC 312 05/14/2023   FERRITIN 63 03/09/2024   FERRITIN 99 02/24/2024   FERRITIN 503 (H) 02/01/2024   IRONPCTSAT 25 02/24/2024   IRONPCTSAT 30 05/14/2023   No results found for: LDH  STUDIES:   US  ELASTOGRAPHY LIVER Result Date: 03/29/2024 CLINICAL DATA:  Fatty liver EXAM: US  ELASTOGRAPHY HEPATIC TECHNIQUE: Sonography of the liver was performed. In addition, ultrasound elastography evaluation of the liver was performed. A region of interest was placed within the right lobe of the liver. Following application of a compressive sonographic pulse, tissue compressibility was assessed. Multiple assessments were performed at the selected site. Median tissue compressibility was determined. Previously, hepatic stiffness was assessed by shear wave velocity. Based on recently published Society of Radiologists in Ultrasound consensus article, reporting is now recommended to be performed in the SI units of pressure (kiloPascals) representing hepatic stiffness/elasticity. The obtained result is compared to the published reference standards. (cACLD = compensated Advanced Chronic Liver Disease) COMPARISON:  Ultrasound 02/19/2024 FINDINGS: Liver: No focal lesion identified. Within normal limits in parenchymal echogenicity. Portal vein is patent on color Doppler imaging with normal direction of blood flow towards the liver. Limitation with overlapping bowel gas and soft tissue. ULTRASOUND HEPATIC ELASTOGRAPHY  Device: Siemens Helix VTQ Patient position: Oblique Transducer: 9C2 Number of measurements: 11 Hepatic segment:  8 Median kPa: 5.3 IQR: 3.4 IQR/Median kPa ratio: 0.6 Data quality: i. IQR/Median kPa ratio of 0.6 or greater when using the DAX probe indicates reduced accuracy Diagnostic category: < or = 9 kPa: in the absence of other known clinical signs, rules out cACLD The use of hepatic elastography is applicable to patients with viral hepatitis and non-alcoholic fatty  liver disease. At this time, there is insufficient data for the referenced cut-off values and use in other causes of liver disease, including alcoholic liver disease. Patients, however, may be assessed by elastography and serve as their own reference standard/baseline. In patients with non-alcoholic liver disease, the values suggesting compensated advanced chronic liver disease (cACLD) may be lower, and patients may need additional testing with elasticity results of 7-9 kPa. Please note that abnormal hepatic elasticity and shear wave velocities may also be identified in clinical settings other than with hepatic fibrosis, such as: acute hepatitis, elevated right heart and central venous pressures including use of beta blockers, veno-occlusive disease (Budd-Chiari), infiltrative processes such as mastocytosis/amyloidosis/infiltrative tumor/lymphoma, extrahepatic cholestasis, with hyperemia in the post-prandial state, and with liver transplantation. Correlation with patient history, laboratory data, and clinical condition recommended. Diagnostic Categories: < or =5 kPa: high probability of being normal < or =9 kPa: in the absence of other known clinical signs, rules out cACLD >9 kPa and ?13 kPa: suggestive of cACLD, but needs further testing >13 kPa: highly suggestive of cACLD > or =17 kPa: highly suggestive of cACLD with an increased probability of clinically significant portal hypertension IMPRESSION: ULTRASOUND LIVER: Fatty liver infiltration. Overall  limited examination with overlapping bowel gas and soft tissue. ULTRASOUND HEPATIC ELASTOGRAPHY: Median kPa:  5.3 Diagnostic category: < or = 9 kPa: in the absence of other known clinical signs, rules out cACLD In the setting of elevated liver function tests, non-fasting state, or vascular congestion, the stage of liver fibrosis may be overestimated. In some patients with NAFLD, the cut-off values for cACLD may be lower (7-9 kPa). In causes other than viral hepatitis and NAFLD, the cut-off values are not well established. Electronically Signed   By: Ranell Bring M.D.   On: 03/29/2024 18:10      HISTORY:   Past Medical History:  Diagnosis Date   Anxiety    Depression    GERD (gastroesophageal reflux disease)    Hyperlipidemia    Hypertension    Pain in joint, lower leg    Thrombocytopenia (HCC)     Past Surgical History:  Procedure Laterality Date   RIGHT/LEFT HEART CATH AND CORONARY ANGIOGRAPHY N/A 01/12/2024   Procedure: RIGHT/LEFT HEART CATH AND CORONARY ANGIOGRAPHY;  Surgeon: Verlin Lonni BIRCH, MD;  Location: [facility];  Service: Cardiovascular;  Laterality: N/A;   TUBAL LIGATION      Family History  Problem Relation Age of Onset   Hyperlipidemia Mother    Hypertension Mother    Hyperlipidemia Father    Hypertension Father     Social History:  reports that she quit smoking about 25 years ago. Her smoking use included cigarettes. She started smoking about 35 years ago. She has a 5 pack-year smoking history. She has never used smokeless tobacco. She reports that she does not drink alcohol and does not use drugs.The patient is alone  today.  Allergies:  Allergies  Allergen Reactions   Sumatriptan Hives and Rash   Sulfa Antibiotics     Current Medications: Current Outpatient Medications  Medication Sig Dispense Refill   albuterol  (VENTOLIN  HFA) 108 (90 Base) MCG/ACT inhaler INHALE 1-2 PUFFS INTO THE LUNGS EVERY 6 HOURS AS NEEDED FOR WHEEZING OR SHORTNESS OF  BREATH 8.5 g 2   amitriptyline  (ELAVIL ) 150 MG tablet TAKE 1 TABLET BY MOUTH DAILY AT BEDTIME. 90 tablet 0   atenolol  (TENORMIN ) 25 MG tablet Take 1 tablet (25 mg total) by mouth 2 (two) times daily. 180 tablet  2   clonazePAM  (KLONOPIN ) 0.5 MG tablet TAKE ONE TABLET BY MOUTH TWICE DAILY 60 tablet 1   EMGALITY 120 MG/ML SOAJ SMARTSIG:2 Milliliter(s) SUB-Q Once     KRILL OIL PO Take 1 capsule by mouth daily.     montelukast (SINGULAIR) 10 MG tablet Take 10 mg by mouth daily.     nitroGLYCERIN  (NITROSTAT ) 0.4 MG SL tablet PLACE 1 TABLET UNDER THE TONGUE AS NEEDED FOR CHEST PAIN ( MAY REPEAT EVERY5 MINUTES X 3) 25 tablet 3   omeprazole  (PRILOSEC) 40 MG capsule Take 1 capsule by mouth once daily. (Patient taking differently: Take 40 mg by mouth daily as needed (Heartburn).) 90 capsule 1   OXYGEN  Inhale 1 L into the lungs at bedtime.     pregabalin  (LYRICA ) 75 MG capsule Take 1 capsule by mouth 3 times daily. 270 capsule 0   QUEtiapine  (SEROQUEL ) 300 MG tablet Take 1 tablet by mouth once daily. AT BEDTIME 90 tablet 1   ranolazine  (RANEXA ) 500 MG 12 hr tablet Take 1 tablet by mouth twice daily. 60 tablet 11   torsemide  (DEMADEX ) 20 MG tablet Take 1 tablet (20 mg total) by mouth daily. 90 tablet 3   Ubrogepant  (UBRELVY ) 100 MG TABS Take 100 mg by mouth 2 (two) times daily as needed (migraine).     atorvastatin  (LIPITOR) 80 MG tablet Take 1 tablet by mouth ONCE daily. 90 tablet 1   meloxicam  (MOBIC ) 15 MG tablet Take 1 tablet (15 mg total) by mouth daily. 30 tablet 2   SYMBICORT 160-4.5 MCG/ACT inhaler Inhale 2 puffs into the lungs 2 (two) times daily.     topiramate  (TOPAMAX ) 100 MG tablet Take 1 tablet by mouth 3 times daily. 270 tablet 0   Vitamin D , Ergocalciferol , (DRISDOL ) 1.25 MG (50000 UNIT) CAPS capsule Take 1 capsule by mouth twice a week as directed 10 capsule 1   No current facility-administered medications for this visit.     ASSESSMENT & PLAN:   Assessment:  Myrikal Messmer is a 58 y.o.  female with history of elevated ferritin. She presents today asymptomatic other than chronic headaches. She states her PCP told her the elevated liver enzymes could be medication related and changes were made. Her headaches are being managed with multiple treatments and followed by neurology. Today, her CBC is unremarkable other than a slightly elevated hemoglobin. CMP reveals improved alk phos from 214 to 172. Ferritin today has decreased from 99 to 63.   Plan: Return PRN  I discussed the assessment and treatment plan with the patient.  The patient was provided an opportunity to ask questions and all were answered.  The patient agreed with the plan and demonstrated an understanding of the instructions.    Thank you for the referral    20 minutes was spent in patient care.  This included time spent preparing to see the patient (e.g., review of tests), obtaining and/or reviewing separately obtained history, counseling and educating the patient/family/caregiver, ordering medications, tests, or procedures; documenting clinical information in the electronic or other health record, independently interpreting results and communicating results to the patient/family/caregiver as well as coordination of care.      Eleanor DELENA Bach, NP   Family Nurse Practitioner - [facility] [place] [phone]

## 2024-04-05 ENCOUNTER — Ambulatory Visit: Admitting: Physician Assistant

## 2024-04-05 ENCOUNTER — Telehealth: Payer: Self-pay | Admitting: Pharmacy Technician

## 2024-04-05 ENCOUNTER — Encounter: Payer: Self-pay | Admitting: Physician Assistant

## 2024-04-05 ENCOUNTER — Other Ambulatory Visit: Payer: Self-pay | Admitting: Nephrology

## 2024-04-05 VITALS — BP 114/94 | HR 89 | Temp 97.8°F | Ht 60.0 in | Wt 242.8 lb

## 2024-04-05 DIAGNOSIS — R0789 Other chest pain: Secondary | ICD-10-CM | POA: Diagnosis not present

## 2024-04-05 DIAGNOSIS — R5383 Other fatigue: Secondary | ICD-10-CM | POA: Diagnosis not present

## 2024-04-05 DIAGNOSIS — N1831 Chronic kidney disease, stage 3a: Secondary | ICD-10-CM

## 2024-04-05 MED ORDER — ATENOLOL 25 MG PO TABS: 25.0000 mg | ORAL_TABLET | Freq: Every day | ORAL | Status: DC

## 2024-04-05 NOTE — Telephone Encounter (Addendum)
 Hi, a couple things.. her insurance wants weight within the last 45 days showing weight loss of 5% but looks like she has gained weight. Also last note from you also said she needs to have the doctor to let you know if they gave her the greenlight to get back on wegovy . [facility] sent this to do a prior authorization but I have it archived for now due to those things.  Ins is asking:Has the patient lost a total of 5% of pretreatment weight, and is the patient maintaining the 5% weight loss with documentation of baseline and current weight (within the past 45 days of this prior authorization request) provided? Please ensure you include pounds (lbs) or kilograms (kg) for the weight value AND the dates of these measurements. NOTE: Physical documentation must be provided.  Last note from 12/2023: Baseline weight/BMI: 239 lb // 46.68 Today's weight/BMI: 232.2 lb // 45.35 DIfference: 6.8 lb // 2.8%  Then on 03/22/24 from gastro her weight was 240lb and bmi: 46.87 kg/m2  Per note from gastro on 03/22/24: Started Wegovy  10/2023 and stopped 12/2023. Possible drug-induced liver injury from Wegovy ? Remain off Wegovy  for now. And then:

## 2024-04-05 NOTE — Progress Notes (Signed)
 Subjective:  Patient ID: Cindy Hammond, female    DOB: [birth year]  Age: 58 y.o. MRN: [id number]  Chief Complaint  Patient presents with   low blood pressure    HPI Pt in today with continued symptoms of exertional dyspnea, fatigue and now states she has been having blood pressure fluctuations with her monitor at home.  Pt has seen pulmonology in past with last visit to Dr Mardee a few months ago but she has decided to change providers and has upcoming appt with new pulmonologist in October.  Pt did have normal chest xray done this month  Pt follows with Dr Sheena (cardiologist ) - she had a recent normal echocardiogram and right/left heart cath with only finding being Mid LAD 20% stenosis She wore an event monitor but was only able to wear it about a week because it caused blisters on her skin - the results were normal but pt states she did not have any symptoms during time she wore it  She states she has been checking her bp at home and it has been jumping around from 150s/115 down to 80//60s.  She has most of her symptoms of fatigue with activity along with shortness of breath She was prescribed tenormin  25mg  bid by cardiology about a year ago but states she only takes it once daily     04/05/2024    2:50 PM 03/09/2024    2:00 PM 02/24/2024    2:56 PM 02/16/2024    2:40 PM 12/15/2023    8:10 AM  Depression screen PHQ 2/9  Decreased Interest 0 0 0 0 0  Down, Depressed, Hopeless 0 0 0 0 0  PHQ - 2 Score 0 0 0 0 0  Altered sleeping  0 0 0   Tired, decreased energy  1 1 1    Change in appetite  1 1 1    Feeling bad or failure about yourself   0 0 0   Trouble concentrating  0 0 0   Suicidal thoughts  0 0 0   PHQ-9 Score  2 2 2    Difficult doing work/chores    Not difficult at all         04/07/2023    1:29 PM 08/12/2023    9:24 AM 12/15/2023    8:10 AM 02/16/2024    2:40 PM 04/05/2024    2:50 PM  Fall Risk  Falls in the past year? 1 0 0 0 1  Was there an injury with Fall? 0 0 0 0 0  Fall  Risk Category Calculator 1 0 0 0 2  Patient at Risk for Falls Due to No Fall Risks No Fall Risks No Fall Risks No Fall Risks History of fall(s)  Fall risk Follow up Falls evaluation completed Falls evaluation completed  Falls evaluation completed Falls evaluation completed     ROS CONSTITUTIONAL: see HPI E/N/T: Negative for ear pain, nasal congestion and sore throat.  CARDIOVASCULAR: see HPI RESPIRATORY: see HPI GASTROINTESTINAL: Negative for abdominal pain, acid reflux symptoms, constipation, diarrhea, nausea and vomiting.  MSK: Negative for arthralgias and myalgias.  INTEGUMENTARY: Negative for rash.  NEUROLOGICAL: Negative for dizziness and headaches.  PSYCHIATRIC: Negative for sleep disturbance and to question depression screen.  Negative for depression, negative for anhedonia.    Current Outpatient Medications:    albuterol  (VENTOLIN  HFA) 108 (90 Base) MCG/ACT inhaler, INHALE 1-2 PUFFS INTO THE LUNGS EVERY 6 HOURS AS NEEDED FOR WHEEZING OR SHORTNESS OF BREATH, Disp: 8.5 g, Rfl: 2  amitriptyline  (ELAVIL ) 150 MG tablet, TAKE 1 TABLET BY MOUTH DAILY AT BEDTIME., Disp: 90 tablet, Rfl: 0   atorvastatin  (LIPITOR) 80 MG tablet, Take 1 tablet by mouth ONCE daily., Disp: 90 tablet, Rfl: 1   clonazePAM  (KLONOPIN ) 0.5 MG tablet, TAKE ONE TABLET BY MOUTH TWICE DAILY, Disp: 60 tablet, Rfl: 1   EMGALITY 120 MG/ML SOAJ, SMARTSIG:2 Milliliter(s) SUB-Q Once, Disp: , Rfl:    KRILL OIL PO, Take 1 capsule by mouth daily., Disp: , Rfl:    meloxicam  (MOBIC ) 15 MG tablet, Take 1 tablet (15 mg total) by mouth daily., Disp: 30 tablet, Rfl: 2   montelukast (SINGULAIR) 10 MG tablet, Take 10 mg by mouth daily., Disp: , Rfl:    nitroGLYCERIN  (NITROSTAT ) 0.4 MG SL tablet, PLACE 1 TABLET UNDER THE TONGUE AS NEEDED FOR CHEST PAIN ( MAY REPEAT EVERY5 MINUTES X 3), Disp: 25 tablet, Rfl: 3   omeprazole  (PRILOSEC) 40 MG capsule, Take 1 capsule by mouth once daily. (Patient taking differently: Take 40 mg by mouth daily  as needed (Heartburn).), Disp: 90 capsule, Rfl: 1   OXYGEN , Inhale 1 L into the lungs at bedtime., Disp: , Rfl:    pregabalin  (LYRICA ) 75 MG capsule, Take 1 capsule by mouth 3 times daily., Disp: 270 capsule, Rfl: 0   QUEtiapine  (SEROQUEL ) 300 MG tablet, Take 1 tablet by mouth once daily. AT BEDTIME, Disp: 90 tablet, Rfl: 1   ranolazine  (RANEXA ) 500 MG 12 hr tablet, Take 1 tablet by mouth twice daily., Disp: 60 tablet, Rfl: 11   SYMBICORT 160-4.5 MCG/ACT inhaler, Inhale 2 puffs into the lungs 2 (two) times daily., Disp: , Rfl:    topiramate  (TOPAMAX ) 100 MG tablet, Take 1 tablet by mouth 3 times daily., Disp: 270 tablet, Rfl: 0   torsemide  (DEMADEX ) 20 MG tablet, Take 1 tablet (20 mg total) by mouth daily., Disp: 90 tablet, Rfl: 3   Ubrogepant  (UBRELVY ) 100 MG TABS, Take 100 mg by mouth 2 (two) times daily as needed (migraine)., Disp: , Rfl:    Vitamin D , Ergocalciferol , (DRISDOL ) 1.25 MG (50000 UNIT) CAPS capsule, Take 1 capsule by mouth twice a week as directed, Disp: 10 capsule, Rfl: 1   atenolol  (TENORMIN ) 25 MG tablet, Take 1 tablet (25 mg total) by mouth daily., Disp: , Rfl:   Past Medical History:  Diagnosis Date   Anxiety    Depression    GERD (gastroesophageal reflux disease)    Hyperlipidemia    Hypertension    Pain in joint, lower leg    Thrombocytopenia (HCC)    Objective:  PHYSICAL EXAM:   BP (!) 114/94   Pulse 89   Temp 97.8 F (36.6 C)   Ht 5' (1.524 m)   Wt 242 lb 12.8 oz (110.1 kg)   LMP  (LMP Unknown)   SpO2 94%   BMI 47.42 kg/m    GEN: Well nourished, well developed, in no acute distress   Cardiac: RRR; no murmurs, rubs, or gallops,no edema -  Respiratory:  normal respiratory rate and pattern with no distress - normal breath sounds with no rales, rhonchi, wheezes or rubs  MS: no deformity or atrophy  Skin: warm and dry, no rash  Neuro:  Alert and Oriented x 3, - CN II-Xii grossly intact Psych: euthymic mood, appropriate affect and demeanor  EKG - no  acute changes - no change from prior  Assessment & Plan:    Other chest pain -     EKG 12-Lead Recommend follow up with  cardiology - will reach out to Dr Sheena to see about repeating event monitor with another device and also to set up ambulatory blood pressure monitoring  Other fatigue -     CBC with Differential/Platelet -     Comprehensive metabolic panel with GFR -     TSH      Follow-up: Return for at next chronic visit.  An After Visit Summary was printed and given to the patient.  CAMIE JONELLE NICHOLAUS DEVONNA [facility] [phone]

## 2024-04-06 ENCOUNTER — Ambulatory Visit: Payer: Self-pay | Admitting: Physician Assistant

## 2024-04-06 LAB — CBC WITH DIFFERENTIAL/PLATELET
Basophils Absolute: 0.1 x10E3/uL (ref 0.0–0.2)
Basos: 1 %
EOS (ABSOLUTE): 0.4 x10E3/uL (ref 0.0–0.4)
Eos: 4 %
Hematocrit: 45.2 % (ref 34.0–46.6)
Hemoglobin: 14.7 g/dL (ref 11.1–15.9)
Immature Grans (Abs): 0 x10E3/uL (ref 0.0–0.1)
Immature Granulocytes: 0 %
Lymphocytes Absolute: 2.2 x10E3/uL (ref 0.7–3.1)
Lymphs: 24 %
MCH: 31.4 pg (ref 26.6–33.0)
MCHC: 32.5 g/dL (ref 31.5–35.7)
MCV: 97 fL (ref 79–97)
Monocytes Absolute: 0.8 x10E3/uL (ref 0.1–0.9)
Monocytes: 9 %
Neutrophils Absolute: 5.6 x10E3/uL (ref 1.4–7.0)
Neutrophils: 62 %
Platelets: 239 x10E3/uL (ref 150–450)
RBC: 4.68 x10E6/uL (ref 3.77–5.28)
RDW: 13 % (ref 11.7–15.4)
WBC: 9.2 x10E3/uL (ref 3.4–10.8)

## 2024-04-06 LAB — COMPREHENSIVE METABOLIC PANEL WITH GFR
ALT: 16 IU/L (ref 0–32)
AST: 13 IU/L (ref 0–40)
Albumin: 3.9 g/dL (ref 3.8–4.9)
Alkaline Phosphatase: 176 IU/L — ABNORMAL HIGH (ref 44–121)
BUN/Creatinine Ratio: 19 (ref 9–23)
BUN: 23 mg/dL (ref 6–24)
Bilirubin Total: 0.5 mg/dL (ref 0.0–1.2)
CO2: 21 mmol/L (ref 20–29)
Calcium: 9.1 mg/dL (ref 8.7–10.2)
Chloride: 107 mmol/L — ABNORMAL HIGH (ref 96–106)
Creatinine, Ser: 1.18 mg/dL — ABNORMAL HIGH (ref 0.57–1.00)
Globulin, Total: 2.8 g/dL (ref 1.5–4.5)
Glucose: 82 mg/dL (ref 70–99)
Potassium: 4.2 mmol/L (ref 3.5–5.2)
Sodium: 141 mmol/L (ref 134–144)
Total Protein: 6.7 g/dL (ref 6.0–8.5)
eGFR: 54 mL/min/1.73 — ABNORMAL LOW (ref >59)

## 2024-04-06 LAB — TSH: TSH: 1.69 u[IU]/mL (ref 0.450–4.500)

## 2024-04-06 NOTE — Telephone Encounter (Signed)
 I called her pharmacy to let them know to hold wegovy  for now

## 2024-04-06 NOTE — Telephone Encounter (Signed)
 Cindy Hammond called this pt after her visit and advised her to make appt with her cardiologist and pt agreed

## 2024-04-07 ENCOUNTER — Ambulatory Visit
Admission: RE | Admit: 2024-04-07 | Discharge: 2024-04-07 | Disposition: A | Discharge status: Home / Self Care | Source: Ambulatory Visit | Attending: Nephrology | Admitting: Nephrology

## 2024-04-07 DIAGNOSIS — N183 Chronic kidney disease, stage 3 unspecified: Secondary | ICD-10-CM | POA: Diagnosis not present

## 2024-04-07 DIAGNOSIS — N1831 Chronic kidney disease, stage 3a: Secondary | ICD-10-CM

## 2024-04-11 ENCOUNTER — Other Ambulatory Visit: Payer: Self-pay

## 2024-04-11 DIAGNOSIS — J4540 Moderate persistent asthma, uncomplicated: Secondary | ICD-10-CM | POA: Diagnosis not present

## 2024-04-11 DIAGNOSIS — R0602 Shortness of breath: Secondary | ICD-10-CM

## 2024-04-11 DIAGNOSIS — R42 Dizziness and giddiness: Secondary | ICD-10-CM

## 2024-04-11 NOTE — Progress Notes (Signed)
 Referral placed for EP.

## 2024-04-14 DIAGNOSIS — L02213 Cutaneous abscess of chest wall: Secondary | ICD-10-CM | POA: Diagnosis not present

## 2024-04-20 ENCOUNTER — Other Ambulatory Visit: Payer: Self-pay | Admitting: Physician Assistant

## 2024-04-20 DIAGNOSIS — K219 Gastro-esophageal reflux disease without esophagitis: Secondary | ICD-10-CM

## 2024-04-21 ENCOUNTER — Ambulatory Visit: Admitting: Internal Medicine

## 2024-04-22 ENCOUNTER — Other Ambulatory Visit: Payer: Self-pay | Admitting: Cardiology

## 2024-04-22 ENCOUNTER — Other Ambulatory Visit

## 2024-04-22 ENCOUNTER — Telehealth: Payer: Self-pay

## 2024-04-22 ENCOUNTER — Ambulatory Visit: Admitting: Internal Medicine

## 2024-04-22 ENCOUNTER — Other Ambulatory Visit (HOSPITAL_COMMUNITY): Payer: Self-pay

## 2024-04-22 ENCOUNTER — Encounter: Payer: Self-pay | Admitting: Internal Medicine

## 2024-04-22 ENCOUNTER — Encounter: Payer: Self-pay | Admitting: Physician Assistant

## 2024-04-22 VITALS — BP 102/80 | HR 82 | Temp 97.5°F | Ht 60.0 in | Wt 239.0 lb

## 2024-04-22 DIAGNOSIS — Z79899 Other long term (current) drug therapy: Secondary | ICD-10-CM | POA: Diagnosis not present

## 2024-04-22 DIAGNOSIS — Z720 Tobacco use: Secondary | ICD-10-CM | POA: Diagnosis not present

## 2024-04-22 DIAGNOSIS — R0602 Shortness of breath: Secondary | ICD-10-CM | POA: Diagnosis not present

## 2024-04-22 DIAGNOSIS — R918 Other nonspecific abnormal finding of lung field: Secondary | ICD-10-CM

## 2024-04-22 DIAGNOSIS — G4730 Sleep apnea, unspecified: Secondary | ICD-10-CM

## 2024-04-22 DIAGNOSIS — J45909 Unspecified asthma, uncomplicated: Secondary | ICD-10-CM

## 2024-04-22 MED ORDER — STIOLTO RESPIMAT 2.5-2.5 MCG/ACT IN AERS
2.0000 | INHALATION_SPRAY | Freq: Every day | RESPIRATORY_TRACT | 5 refills | Status: DC
Start: 2024-04-22 — End: 2024-05-06

## 2024-04-22 MED ORDER — BUMETANIDE 2 MG PO TABS
2.0000 mg | ORAL_TABLET | Freq: Every day | ORAL | 3 refills | Status: DC
  Filled 2024-04-22: qty 90, 90d supply, fill #0

## 2024-04-22 NOTE — Telephone Encounter (Signed)
 Pt in office with her husband for an appointment today.  Pt evaluated by Dr. Sheena, swelling noted. Orders for lab work and medication changes given. Pt verbalized understanding, no further questions at this time.

## 2024-04-22 NOTE — Patient Instructions (Signed)
 It was a pleasure to see you today!  Please schedule follow up with myself in 2 months.  If my schedule is not open yet, we will contact you with a reminder closer to that time. Please call [phone] if you haven't heard from us  a month before, and always call us  sooner if issues or concerns arise. You can also send us  a message through MyChart, but but aware that this is not to be used for urgent issues and it may take up to 5-7 days to receive a reply. Please be aware that you will likely be able to view your results before I have a chance to respond to them. Please give us  5 business days to respond to any non-urgent results.    VISIT SUMMARY:  Today, you were seen for lightheadedness and shortness of breath that you have been experiencing for the past few months, especially during physical activity. You also reported episodes of wheezing and chest tightness. We discussed your history of smoking and its potential impact on your symptoms.  YOUR PLAN:  -DYSPNEA AND EXERTIONAL LIGHTHEADEDNESS: Dyspnea means difficulty breathing, and exertional lightheadedness refers to feeling lightheaded during physical activity. We will conduct pulmonary function testing to check for lung disease related to smoking. You have been prescribed a Stiolto inhaler to use two puffs every morning, and you should reduce your use of the albuterol  inhaler to avoid worsening your symptoms. Proper use of the Stiolto inhaler was explained to you.  -WHEEZING AND CHEST TIGHTNESS: Wheezing is a high-pitched whistling sound when you breathe, and chest tightness is a feeling of pressure in the chest. Both can be related to your breathing difficulties. You have been prescribed the Stiolto inhaler to help reduce these symptoms, and you should also reduce your use of the albuterol  inhaler.  -TOBACCO USE: Smoking can significantly impact your lung health and contribute to your symptoms. It is important to stop smoking to reduce the  risk of lung disease.  INSTRUCTIONS:  Please follow up with the pulmonary function testing as ordered. Use the Stiolto inhaler as prescribed and reduce your use of the albuterol  inhaler. Consider smoking cessation to improve your lung health. If you experience any worsening of symptoms or new symptoms, please contact our office.

## 2024-04-22 NOTE — Progress Notes (Signed)
 Cindy Hammond    [id number]    1966/04/05  Primary Care Physician:Davis, Camie RIGGERS  Referring Physician: Tobb, Kardie, DO [address] [place],  [place] [address] Reason for Consultation: shortness of breath Date of Consultation: 04/22/2024  Chief complaint:   Chief Complaint  Patient presents with   Consult   Shortness of Breath    Sob and light headedness on exertion     HPI:  Discussed the use of AI scribe software for clinical note transcription with the patient, who gave verbal consent to proceed.  History of Present Illness Cindy Hammond is a 58 year old female who presents with lightheadedness and shortness of breath.  For the past two and a half to three months, she has experienced episodes of lightheadedness and shortness of breath, particularly when walking. These episodes are accompanied by a sensation of her heart speeding up or slowing down, and she feels as though she might pass out. She describes her head as feeling 'lightheaded' and 'scattered,' and she loses her hearing during these episodes. She often needs to sit down to catch her breath.  The symptoms began suddenly while walking at [facility], where she felt weak and subsequently fell to the ground. Her legs feel weak during these episodes, and she has been close to syncope several times. She has noted that her blood pressure seems low during these episodes, with readings around 60/80 or 100/60-70, and her heart rate is usually in the 70s to 80s.  She experiences wheezing and chest tightness during these episodes, which can occur even when she is sitting and resting. Resting seems to help alleviate the symptoms. No frequent pneumonia or bronchitis and no breathing issues as a child.  She has a history of sleep apnea and previously used a CPAP machine, which was recalled. A subsequent sleep study indicated she needed oxygen  at night, and she currently uses one liter of oxygen  at night, as two  liters makes her feel unwell.  She reports smoking every now and then, with a pack lasting about three days. She started smoking at 70, quit in 2000, and resumed in December 1999 due to stress while caring for her parents.  She uses an albuterol  inhaler four to five times a day, which helps her breathing. She does not use any other inhalers regularly, although Symbicort is on her medication list. She also takes montelukast for allergies.   Social history:  Occupation: Exposures: Smoking history:  Social History   Occupational History   Occupation: Unemployed  Tobacco Use   Smoking status: Some Days    Current packs/day: 0.50    Average packs/day: 0.5 packs/day for 35.6 years (17.8 ttl pk-yrs)    Types: Cigarettes    Start date: 1990   Smokeless tobacco: Never  Vaping Use   Vaping status: Never Used  Substance and Sexual Activity   Alcohol use: No    Alcohol/week: 0.0 standard drinks of alcohol   Drug use: No   Sexual activity: Yes    Partners: Male    Relevant family history:  Family History  Problem Relation Age of Onset   Hyperlipidemia Mother    Hypertension Mother    Hyperlipidemia Father    Hypertension Father     Past Medical History:  Diagnosis Date   Anxiety    Depression    GERD (gastroesophageal reflux disease)    Hyperlipidemia    Hypertension    Pain in joint, lower leg  Thrombocytopenia (HCC)     Past Surgical History:  Procedure Laterality Date   RIGHT/LEFT HEART CATH AND CORONARY ANGIOGRAPHY N/A 01/12/2024   Procedure: RIGHT/LEFT HEART CATH AND CORONARY ANGIOGRAPHY;  Surgeon: Verlin Lonni BIRCH, MD;  Location: [facility];  Service: Cardiovascular;  Laterality: N/A;   TUBAL LIGATION       Physical Exam: Blood pressure 102/80, pulse 82, temperature (!) 97.5 F (36.4 C), temperature source Oral, height 5' (1.524 m), weight 239 lb (108.4 kg), SpO2 98%. Gen:      No acute distress, appears older than stated age ENT:  no nasal  polyps, mucus membranes moist Lungs:    No increased respiratory effort, symmetric chest wall excursion, clear to auscultation bilaterally, no wheezes or crackles CV:         Regular rate and rhythm; no murmurs, rubs, or gallops.  No pedal edema Abd:     Obese,  + bowel sounds; soft, non-tender; no distension MSK: no acute synovitis of DIP or PIP joints, no mechanics hands.  Skin:      Warm and dry; no rashes Neuro: normal speech, no focal facial asymmetry Psych: alert and oriented x3, normal mood and affect   Data Reviewed/Medical Decision Making:  Independent interpretation of tests: Imaging:  Review of patient's chest xray 2022 images revealed no acute process. The patient's images have been independently reviewed by me.   [facility]25 negative for PE, normal parenchyma    PFTs:  RHC/LHC 2025 Minimal luminal irregularities in the LAD No angiographic evidence of disease in the RCA or Circumflex Normal LV systolic function with LVEDP of 12 mmHg and PCWP of 5 Elevated RV and PA pressures (see hemodynamics for PVR) CO 5.07 L/min  CI 2.51   Labs:  Lab Results  Component Value Date   NA 141 04/05/2024   K 4.2 04/05/2024   CO2 21 04/05/2024   GLUCOSE 82 04/05/2024   BUN 23 04/05/2024   CREATININE 1.18 (H) 04/05/2024   CALCIUM  9.1 04/05/2024   EGFR 54 (L) 04/05/2024   GFRNONAA >60 02/24/2024   Lab Results  Component Value Date   WBC 9.2 04/05/2024   HGB 14.7 04/05/2024   HCT 45.2 04/05/2024   MCV 97 04/05/2024   PLT 239 04/05/2024     Immunization status:  Immunization History  Administered Date(s) Administered   Fluad Trivalent(High Dose 65+) 05/14/2023   Influenza Inj Mdck Quad Pf 05/03/2020, 05/22/2021, 06/02/2022   Influenza-Unspecified 07/25/2019   PNEUMOCOCCAL CONJUGATE-20 06/02/2022   Pneumococcal Polysaccharide-23 05/09/2020, 02/18/2021   Tdap 08/14/2020   Zoster Recombinant(Shingrix ) 08/06/2022     I reviewed prior external  note(s) from GI, cardiology  I reviewed the result(s) of the labs and imaging as noted above.   I have ordered pft   Assessment and Plan Assessment & Plan Dyspnea and exertional lightheadedness Lightheadedness and dyspnea during exertion with low blood pressure and normal cardiac evaluation. Differential includes pulmonary causes and excessive albuterol  use. Orthostatic hypotension - Order pulmonary function testing to assess for smoking-related lung disease. - Prescribe Stiolto inhaler, two puffs every morning. - Instruct to reduce albuterol  use to avoid exacerbating symptoms. - Educated on proper Stiolto inhaler use.  Wheezing and chest tightness Wheezing and chest tightness associated with dyspnea and lightheadedness. Excessive albuterol  use may contribute to symptoms. - Prescribe Stiolto inhaler to reduce wheezing and chest tightness. - Instruct to reduce albuterol  use.  Tobacco use Smoking since age 49 with current use of one  pack every three days. Smoking cessation is crucial to reduce lung disease risk.     Return to Care: Return in about 2 months (around 06/22/2024) for PFT and same day appointment.  Verdon Gore, MD Pulmonary and Critical Care Medicine [facility] Office:[phone]  CC: Tobb, Kardie, [place]

## 2024-04-22 NOTE — Progress Notes (Signed)
 The patient has been prescribed the inhaler stiolto respimat . Inhaler technique was demonstrated to patient. The patient subsequently demonstrated correct technique.

## 2024-04-22 NOTE — Telephone Encounter (Signed)
 Patient coming in Monday at 1000. Spoke with patient's husband as Alexsia was in an doctor's appointment.

## 2024-04-23 LAB — COMPREHENSIVE METABOLIC PANEL WITH GFR
ALT: 13 IU/L (ref 0–32)
AST: 12 IU/L (ref 0–40)
Albumin: 3.8 g/dL (ref 3.8–4.9)
Alkaline Phosphatase: 176 IU/L — ABNORMAL HIGH (ref 44–121)
BUN/Creatinine Ratio: 18 (ref 9–23)
BUN: 23 mg/dL (ref 6–24)
Bilirubin Total: 0.7 mg/dL (ref 0.0–1.2)
CO2: 22 mmol/L (ref 20–29)
Calcium: 9.3 mg/dL (ref 8.7–10.2)
Chloride: 107 mmol/L — ABNORMAL HIGH (ref 96–106)
Creatinine, Ser: 1.3 mg/dL — ABNORMAL HIGH (ref 0.57–1.00)
Globulin, Total: 3.4 g/dL (ref 1.5–4.5)
Glucose: 94 mg/dL (ref 70–99)
Potassium: 4.2 mmol/L (ref 3.5–5.2)
Sodium: 144 mmol/L (ref 134–144)
Total Protein: 7.2 g/dL (ref 6.0–8.5)
eGFR: 48 mL/min/1.73 — ABNORMAL LOW (ref >59)

## 2024-04-23 LAB — MAGNESIUM: Magnesium: 2.3 mg/dL (ref 1.6–2.3)

## 2024-04-25 ENCOUNTER — Ambulatory Visit: Admitting: Physician Assistant

## 2024-04-25 ENCOUNTER — Ambulatory Visit: Payer: Self-pay | Admitting: Physician Assistant

## 2024-04-25 ENCOUNTER — Encounter: Payer: Self-pay | Admitting: Physician Assistant

## 2024-04-25 VITALS — BP 98/60 | HR 78 | Temp 97.4°F | Resp 18 | Ht 60.0 in | Wt 240.0 lb

## 2024-04-25 DIAGNOSIS — R55 Syncope and collapse: Secondary | ICD-10-CM | POA: Diagnosis not present

## 2024-04-25 DIAGNOSIS — R5383 Other fatigue: Secondary | ICD-10-CM

## 2024-04-25 DIAGNOSIS — R0609 Other forms of dyspnea: Secondary | ICD-10-CM | POA: Insufficient documentation

## 2024-04-25 HISTORY — DX: Syncope and collapse: R55

## 2024-04-25 HISTORY — DX: Other forms of dyspnea: R06.09

## 2024-04-25 LAB — COMPREHENSIVE METABOLIC PANEL WITH GFR: EGFR: 53

## 2024-04-25 NOTE — Progress Notes (Addendum)
 Acute Office Visit  Subjective:    Patient ID: Cindy Hammond, female    DOB: 1966-01-20, 58 y.o.   MRN: [id number]  Chief Complaint  Patient presents with   Exertional dyspnea   Fatigue    HPI: Patient is in today for complaints of fatigue, shortness of breath, and intermittent chest pains Pt states she has been having these symptoms for a few  months but now are worsening.  She actually had an episode 2 days ago walking from her car to inside her house and became extremely short of breath and actually passed out.  Her husband states she was out about 3 minutes.  This is the first time that has happened.  They did not call EMS or go to the hospital She states her blood pressure continues to fluctuate between having readings 150-160/80s and down to 80s/60s She established with new pulmonologist recently and was given rx for stiolto and is scheduled to go back for pulmonary function testing She has had cardiology workup including normal cardiac cath a few months ago.  She did wear an event monitor but only could wear a few days because it irritated her skin.  I did reach out to cardiologist and discussed pt with her and she has upcoming appt - per pt she will possibly be getting what sounds like a loop recorder Pt also had a chest xray about 8 weeks ago which was normal She denies any leg pain but does have some swelling in lower extremities  She is taking atenolol  25mg  qd for htn and bumex  was recently started for swelling Today in office she is fatigued and had shortness of breath walking from car into office She denies chest pain She states her fingers have had a new mottled appearance to them in the past week She is extremely tired --  denies melena/hematochezia   Current Outpatient Medications:    albuterol  (VENTOLIN  HFA) 108 (90 Base) MCG/ACT inhaler, INHALE 1-2 PUFFS INTO THE LUNGS EVERY 6 HOURS AS NEEDED FOR WHEEZING OR SHORTNESS OF BREATH, Disp: 8.5 g, Rfl: 2   amitriptyline   (ELAVIL ) 150 MG tablet, TAKE 1 TABLET BY MOUTH DAILY AT BEDTIME., Disp: 90 tablet, Rfl: 0   atenolol  (TENORMIN ) 25 MG tablet, Take 1 tablet (25 mg total) by mouth daily., Disp: , Rfl:    atorvastatin  (LIPITOR) 80 MG tablet, Take 1 tablet by mouth ONCE daily., Disp: 90 tablet, Rfl: 1   bumetanide  (BUMEX ) 2 MG tablet, Take 1 tablet (2 mg total) by mouth daily., Disp: 90 tablet, Rfl: 3   clonazePAM  (KLONOPIN ) 0.5 MG tablet, TAKE ONE TABLET BY MOUTH TWICE DAILY, Disp: 60 tablet, Rfl: 1   EMGALITY 120 MG/ML SOAJ, SMARTSIG:2 Milliliter(s) SUB-Q Once, Disp: , Rfl:    KRILL OIL PO, Take 1 capsule by mouth daily., Disp: , Rfl:    meloxicam  (MOBIC ) 15 MG tablet, Take 1 tablet (15 mg total) by mouth daily., Disp: 30 tablet, Rfl: 2   montelukast (SINGULAIR) 10 MG tablet, Take 10 mg by mouth daily., Disp: , Rfl:    nitroGLYCERIN  (NITROSTAT ) 0.4 MG SL tablet, PLACE 1 TABLET UNDER THE TONGUE AS NEEDED FOR CHEST PAIN ( MAY REPEAT EVERY5 MINUTES X 3), Disp: 25 tablet, Rfl: 3   omeprazole  (PRILOSEC) 40 MG capsule, Take 1 capsule by mouth once daily., Disp: 90 capsule, Rfl: 1   OXYGEN , Inhale 1 L into the lungs at bedtime., Disp: , Rfl:    pregabalin  (LYRICA ) 75 MG capsule, Take 1 capsule by  mouth 3 times daily., Disp: 270 capsule, Rfl: 0   QUEtiapine  (SEROQUEL ) 300 MG tablet, TAKE ONE TABLET BY MOUTH AT BEDTIME, Disp: 90 tablet, Rfl: 1   ranolazine  (RANEXA ) 500 MG 12 hr tablet, Take 1 tablet by mouth twice daily., Disp: 60 tablet, Rfl: 11   Tiotropium Bromide-Olodaterol (STIOLTO RESPIMAT ) 2.5-2.5 MCG/ACT AERS, Inhale 2 puffs into the lungs daily., Disp: 4 g, Rfl: 5   topiramate  (TOPAMAX ) 100 MG tablet, Take 1 tablet by mouth 3 times daily., Disp: 270 tablet, Rfl: 0   Ubrogepant  (UBRELVY ) 100 MG TABS, Take 100 mg by mouth 2 (two) times daily as needed (migraine)., Disp: , Rfl:    Vitamin D , Ergocalciferol , (DRISDOL ) 1.25 MG (50000 UNIT) CAPS capsule, Take 1 capsule by mouth twice a week as directed, Disp: 10 capsule,  Rfl: 1  Allergies  Allergen Reactions   Sumatriptan Hives and Rash   Sulfa Antibiotics     ROS CONSTITUTIONAL: see HPI E/N/T: Negative for ear pain, nasal congestion and sore throat.  CARDIOVASCULAR: see HPI RESPIRATORY: see HPI GASTROINTESTINAL: Negative for abdominal pain, acid reflux symptoms, constipation, diarrhea, nausea and vomiting.  MSK: Negative for arthralgias and myalgias.  INTEGUMENTARY: Negative for rash.  NEUROLOGICAL: Negative for dizziness and headaches.  PSYCHIATRIC: Negative for sleep disturbance and to question depression screen.  Negative for depression, negative for anhedonia.      Objective:    PHYSICAL EXAM:   BP 98/60   Pulse 78   Temp (!) 97.4 F (36.3 C)   Resp 18   Ht 5' (1.524 m)   Wt 240 lb (108.9 kg)   LMP  (LMP Unknown)   SpO2 95%   BMI 46.87 kg/m   BLOOD PRESSURE VERY DIFFICULT TO OBTAIN --- VERY FAINT GEN: pt appears tired, fatigued HEENT: normal external ears and nose - normal external auditory canals and TMS - - Lips, Teeth and Gums - normal  Oropharynx - normal mucosa, palate, and posterior pharynx Neck: no JVD or masses - no thyromegaly Cardiac: RRR; no murmurs, rubs, or gallops, - MINIMAL BILATERAL LOWER EXTREMITY EDEMA Respiratory:  normal respiratory rate and pattern with no distress - normal breath sounds with no rales, rhonchi, wheezes or rubs MS: no deformity or atrophy  Skin: warm and dry, no rash  Neuro:  Alert and Oriented x 3,  - CN II-Xii grossly intact Psych: euthymic mood, appropriate affect and demeanor  EKG - NO ACUTE CHANGES    Assessment & Plan:    Exertional dyspnea -     EKG 12-Lead -     CT Angio Chest Pulmonary Embolism (PE) W or WO Contrast; Future  Other fatigue -     CBC with Differential/Platelet -     Comprehensive metabolic panel with GFR  Syncope, unspecified syncope type PT SENT TO [facility] FOR 1L NORMAL SALINE AND LABWORK  FOLLOW UP WITH CARDIOLOGY AND PULMONOLOGY AS  DIRECTED  Stop bumex  and stop tenormin   Follow-up: Return in about 1 week (around 05/02/2024) for follow-up. OR TO ED IF SYMPTOMS CHANGE OR WORSEN  An After Visit Summary was printed and given to the patient.  CAMIE JONELLE NICHOLAUS DEVONNA [facility] [phone]

## 2024-04-25 NOTE — Addendum Note (Signed)
 Addended byBETHA NICHOLAUS CREDIT on: 04/25/2024 01:10 PM   Modules accepted: Orders

## 2024-04-26 ENCOUNTER — Ambulatory Visit: Payer: Self-pay | Admitting: Physician Assistant

## 2024-04-26 ENCOUNTER — Encounter: Payer: Self-pay | Admitting: Cardiology

## 2024-04-26 ENCOUNTER — Encounter: Payer: Self-pay | Admitting: Physician Assistant

## 2024-04-26 ENCOUNTER — Ambulatory Visit: Admitting: Physician Assistant

## 2024-05-01 DIAGNOSIS — Z79899 Other long term (current) drug therapy: Secondary | ICD-10-CM | POA: Diagnosis not present

## 2024-05-01 DIAGNOSIS — N189 Chronic kidney disease, unspecified: Secondary | ICD-10-CM | POA: Diagnosis not present

## 2024-05-01 DIAGNOSIS — I509 Heart failure, unspecified: Secondary | ICD-10-CM | POA: Diagnosis not present

## 2024-05-01 DIAGNOSIS — I2510 Atherosclerotic heart disease of native coronary artery without angina pectoris: Secondary | ICD-10-CM | POA: Diagnosis not present

## 2024-05-01 DIAGNOSIS — R55 Syncope and collapse: Secondary | ICD-10-CM | POA: Diagnosis not present

## 2024-05-01 DIAGNOSIS — I130 Hypertensive heart and chronic kidney disease with heart failure and stage 1 through stage 4 chronic kidney disease, or unspecified chronic kidney disease: Secondary | ICD-10-CM | POA: Diagnosis not present

## 2024-05-01 DIAGNOSIS — R0602 Shortness of breath: Secondary | ICD-10-CM | POA: Diagnosis not present

## 2024-05-03 ENCOUNTER — Other Ambulatory Visit: Payer: Self-pay | Admitting: Family Medicine

## 2024-05-03 ENCOUNTER — Encounter: Payer: Self-pay | Admitting: Physician Assistant

## 2024-05-03 DIAGNOSIS — R9431 Abnormal electrocardiogram [ECG] [EKG]: Secondary | ICD-10-CM | POA: Diagnosis not present

## 2024-05-03 DIAGNOSIS — F419 Anxiety disorder, unspecified: Secondary | ICD-10-CM

## 2024-05-03 DIAGNOSIS — I517 Cardiomegaly: Secondary | ICD-10-CM | POA: Diagnosis not present

## 2024-05-04 ENCOUNTER — Encounter: Payer: Self-pay | Admitting: Cardiology

## 2024-05-05 ENCOUNTER — Ambulatory Visit: Admitting: Physician Assistant

## 2024-05-05 ENCOUNTER — Encounter: Payer: Self-pay | Admitting: Physician Assistant

## 2024-05-05 VITALS — BP 122/90 | HR 100 | Temp 97.6°F | Resp 16 | Ht 60.0 in | Wt 239.4 lb

## 2024-05-05 DIAGNOSIS — R55 Syncope and collapse: Secondary | ICD-10-CM | POA: Diagnosis not present

## 2024-05-05 DIAGNOSIS — R5381 Other malaise: Secondary | ICD-10-CM | POA: Diagnosis not present

## 2024-05-05 DIAGNOSIS — Z23 Encounter for immunization: Secondary | ICD-10-CM

## 2024-05-05 NOTE — Progress Notes (Addendum)
 Subjective:  Patient ID: Cindy Hammond, female    DOB: 24-Jan-1966  Age: 58 y.o. MRN: [id number]  Chief Complaint  Patient presents with   Medical Management of Chronic Issues    HPI Pt in today for follow up of syncopal symptoms.  Since last visit she had an episode this past Sunday at church where she was walking outside and felt dizzy and passed out. (ED notes say that she felt syncopal symptoms but does not document she also collapsed) - she was transported by EMS to ER for further evaluation.  All tests/labs were stable - she was given IV and after few hours felt better. She has felt ok since that time but concerned since symptoms are worsening and this is the second episode in which she collapsed. She is scheduled with cardiology to get loop monitor implanted on 9/15 She voices no other concerns or issues today  She would like flu shot today     06/07/2024    1:16 PM 04/05/2024    2:50 PM 03/09/2024    2:00 PM 02/24/2024    2:56 PM 02/16/2024    2:40 PM  Depression screen PHQ 2/9  Decreased Interest 0 0 0 0 0  Down, Depressed, Hopeless  0 0 0 0  PHQ - 2 Score 0 0 0 0 0  Altered sleeping 0  0 0 0  Tired, decreased energy 2  1 1 1  Change in appetite 0  1 1 1  Feeling bad or failure about yourself  0  0 0 0  Trouble concentrating 0  0 0 0  Moving slowly or fidgety/restless 0      Suicidal thoughts 0  0 0 0  PHQ-9 Score 2  2 2 2  Difficult doing work/chores Not difficult at all    Not difficult at all        12 /07/2023    9:24 AM 12/15/2023    8:10 AM 02/16/2024    2:40 PM 04/05/2024    2:50 PM 06/07/2024    1:16 PM  Fall Risk  Falls in the past year? 0 0 0 1 0  Was there an injury with Fall? 0 0 0 0 0  Fall Risk Category Calculator 0 0 0 2 0  Patient at Risk for Falls Due to No Fall Risks No Fall Risks No Fall Risks History of fall(s) No Fall Risks  Fall risk Follow up Falls evaluation completed  Falls evaluation completed Falls evaluation completed Falls evaluation completed      ROS CONSTITUTIONAL: see HPI E/N/T: Negative for ear pain, nasal congestion and sore throat.  CARDIOVASCULAR: see HPI RESPIRATORY: Negative for recent cough and dyspnea.  GASTROINTESTINAL: Negative for abdominal pain, acid reflux symptoms, constipation, diarrhea, nausea and vomiting.  MSK: Negative for arthralgias and myalgias.  INTEGUMENTARY: Negative for rash.  NEUROLOGICAL: Negative for dizziness and headaches.     Current Outpatient Medications:    albuterol  (VENTOLIN  HFA) 108 (90 Base) MCG/ACT inhaler, INHALE 1-2 PUFFS INTO THE LUNGS EVERY 6 HOURS AS NEEDED FOR WHEEZING OR SHORTNESS OF BREATH, Disp: 8.5 g, Rfl: 2   atorvastatin  (LIPITOR) 80 MG tablet, Take 1 tablet by mouth ONCE daily., Disp: 90 tablet, Rfl: 1   clonazePAM  (KLONOPIN ) 0.5 MG tablet, TAKE ONE TABLET BY MOUTH TWICE DAILY, Disp: 60 tablet, Rfl: 1   EMGALITY 120 MG/ML SOAJ, SMARTSIG:2 Milliliter(s) SUB-Q Once, Disp: , Rfl:    KRILL OIL PO, Take 1 capsule by mouth daily., Disp: , Rfl:  meloxicam  (MOBIC ) 15 MG tablet, Take 1 tablet (15 mg total) by mouth daily., Disp: 30 tablet, Rfl: 2   montelukast (SINGULAIR) 10 MG tablet, Take 10 mg by mouth daily., Disp: , Rfl:    nitroGLYCERIN  (NITROSTAT ) 0.4 MG SL tablet, PLACE 1 TABLET UNDER THE TONGUE AS NEEDED FOR CHEST PAIN ( MAY REPEAT EVERY5 MINUTES X 3), Disp: 25 tablet, Rfl: 3   omeprazole  (PRILOSEC) 40 MG capsule, Take 1 capsule by mouth once daily., Disp: 90 capsule, Rfl: 1   OXYGEN , Inhale 1 L into the lungs at bedtime., Disp: , Rfl:    pregabalin  (LYRICA ) 75 MG capsule, Take 1 capsule by mouth 3 times daily., Disp: 270 capsule, Rfl: 0   QUEtiapine  (SEROQUEL ) 300 MG tablet, TAKE ONE TABLET BY MOUTH AT BEDTIME, Disp: 90 tablet, Rfl: 1   ranolazine  (RANEXA ) 500 MG 12 hr tablet, Take 1 tablet by mouth twice daily., Disp: 60 tablet, Rfl: 11   topiramate  (TOPAMAX ) 100 MG tablet, Take 1 tablet by mouth 3 times daily., Disp: 270 tablet, Rfl: 0   Ubrogepant  (UBRELVY ) 100 MG  TABS, Take 100 mg by mouth 2 (two) times daily as needed (migraine)., Disp: , Rfl:    amitriptyline  (ELAVIL ) 150 MG tablet, TAKE 1 TABLET BY MOUTH DAILY AT BEDTIME., Disp: 90 tablet, Rfl: 0   Tiotropium Bromide-Olodaterol (STIOLTO RESPIMAT ) 2.5-2.5 MCG/ACT AERS, Inhale 2 puffs into the lungs daily., Disp: 4 g, Rfl: 5   Vitamin D , Ergocalciferol , (DRISDOL ) 1.25 MG (50000 UNIT) CAPS capsule, Take 1 capsule by mouth twice a week as directed, Disp: 10 capsule, Rfl: 1  Past Medical History:  Diagnosis Date   Anxiety    Depression    GERD (gastroesophageal reflux disease)    Hyperlipidemia    Hypertension    Pain in joint, lower leg    Thrombocytopenia    Objective:  PHYSICAL EXAM:   BP (!) 122/90   Pulse 100   Temp 97.6 F (36.4 C) (Temporal)   Resp 16   Ht 5' (1.524 m)   Wt 239 lb 6.4 oz (108.6 kg)   LMP  (LMP Unknown)   SpO2 93%   BMI 46.75 kg/m   PT BP VERY FAINT GEN: Well nourished, well developed, in no acute distress  Cardiac: RRR; no murmurs, rubs, or gallops,no edema - Respiratory:  normal respiratory rate and pattern with no distress - normal breath sounds with no rales, rhonchi, wheezes or rubs Psych: euthymic mood, appropriate affect and demeanor  Assessment & Plan:    Syncope, unspecified syncope type -     CBC with Differential/Platelet -     Comprehensive metabolic panel with GFR -     Magnesium  Malaise -     CBC with Differential/Platelet -     Comprehensive metabolic panel with GFR -     Magnesium  Needs flu shot -     Flu vaccine, recombinant, trivalent, inj   I did leave message with Dr Sheena inquiring about whether to have patient stay off bumex  and tenormin  (since bp slightly elevated today) and also to see if any other event monitor can be ordered for patient before her appt to get loop recorder  Follow-up: Return in about 4 weeks (around 06/02/2024) for follow-up - and with Dr Sheena as scheduled.  An After Visit Summary was printed and given to  the patient.  [facility] [phone]

## 2024-05-06 ENCOUNTER — Ambulatory Visit: Payer: Self-pay | Admitting: Physician Assistant

## 2024-05-06 ENCOUNTER — Telehealth: Payer: Self-pay

## 2024-05-06 LAB — MAGNESIUM: Magnesium: 2.2 mg/dL (ref 1.6–2.3)

## 2024-05-06 LAB — COMPREHENSIVE METABOLIC PANEL WITH GFR
ALT: 14 IU/L (ref 0–32)
AST: 13 IU/L (ref 0–40)
Albumin: 4.1 g/dL (ref 3.8–4.9)
Alkaline Phosphatase: 173 IU/L — ABNORMAL HIGH (ref 44–121)
BUN/Creatinine Ratio: 13 (ref 9–23)
BUN: 15 mg/dL (ref 6–24)
Bilirubin Total: 0.7 mg/dL (ref 0.0–1.2)
CO2: 18 mmol/L — ABNORMAL LOW (ref 20–29)
Calcium: 9.5 mg/dL (ref 8.7–10.2)
Chloride: 105 mmol/L (ref 96–106)
Creatinine, Ser: 1.16 mg/dL — ABNORMAL HIGH (ref 0.57–1.00)
Globulin, Total: 3.3 g/dL (ref 1.5–4.5)
Glucose: 125 mg/dL — ABNORMAL HIGH (ref 70–99)
Potassium: 3.8 mmol/L (ref 3.5–5.2)
Sodium: 142 mmol/L (ref 134–144)
Total Protein: 7.4 g/dL (ref 6.0–8.5)
eGFR: 55 mL/min/1.73 — ABNORMAL LOW (ref >59)

## 2024-05-06 LAB — CBC WITH DIFFERENTIAL/PLATELET
Basophils Absolute: 0.1 x10E3/uL (ref 0.0–0.2)
Basos: 1 %
EOS (ABSOLUTE): 0.4 x10E3/uL (ref 0.0–0.4)
Eos: 4 %
Hematocrit: 51.5 % — ABNORMAL HIGH (ref 34.0–46.6)
Hemoglobin: 16.9 g/dL — ABNORMAL HIGH (ref 11.1–15.9)
Immature Grans (Abs): 0.1 x10E3/uL (ref 0.0–0.1)
Immature Granulocytes: 1 %
Lymphocytes Absolute: 2.7 x10E3/uL (ref 0.7–3.1)
Lymphs: 24 %
MCH: 31.5 pg (ref 26.6–33.0)
MCHC: 32.8 g/dL (ref 31.5–35.7)
MCV: 96 fL (ref 79–97)
Monocytes Absolute: 0.8 x10E3/uL (ref 0.1–0.9)
Monocytes: 7 %
Neutrophils Absolute: 7.2 x10E3/uL — ABNORMAL HIGH (ref 1.4–7.0)
Neutrophils: 63 %
Platelets: 275 x10E3/uL (ref 150–450)
RBC: 5.36 x10E6/uL — ABNORMAL HIGH (ref 3.77–5.28)
RDW: 13.2 % (ref 11.7–15.4)
WBC: 11.2 x10E3/uL — ABNORMAL HIGH (ref 3.4–10.8)

## 2024-05-06 MED ORDER — STIOLTO RESPIMAT 2.5-2.5 MCG/ACT IN AERS: 2.0000 | INHALATION_SPRAY | Freq: Every day | RESPIRATORY_TRACT | 5 refills | Status: DC

## 2024-05-06 NOTE — Telephone Encounter (Signed)
 Copied from CRM [id number]. Topic: Clinical - Prescription Issue >> May 05, 2024  3:28 PM Devaughn RAMAN wrote: Reason for CRM: Patient stated she was seen on 04/22/24 and she was supposed to have Tiotropium Bromide-Olodaterol (STIOLTO RESPIMAT ) 2.5-2.5 MCG/ACT AERS [facility] into [facility], patient confirmed pharmacy is the correct one on file but she has not yet received the medication as of yet.  Stiolto inhaler was sent to [facility] 8/22.  ATC the patient, no answer. Left detailed vm (DPR) stating [facility] inhal[facility] has been [facility] [place]. Nfn

## 2024-05-12 DIAGNOSIS — J4540 Moderate persistent asthma, uncomplicated: Secondary | ICD-10-CM | POA: Diagnosis not present

## 2024-05-16 ENCOUNTER — Ambulatory Visit: Payer: Self-pay | Admitting: Cardiology

## 2024-05-16 ENCOUNTER — Ambulatory Visit: Attending: Cardiology | Admitting: Cardiology

## 2024-05-16 ENCOUNTER — Other Ambulatory Visit

## 2024-05-16 ENCOUNTER — Encounter: Payer: Self-pay | Admitting: Cardiology

## 2024-05-16 VITALS — BP 112/74 | HR 102 | Ht 60.0 in | Wt 240.0 lb

## 2024-05-16 DIAGNOSIS — R55 Syncope and collapse: Secondary | ICD-10-CM | POA: Diagnosis not present

## 2024-05-16 DIAGNOSIS — R42 Dizziness and giddiness: Secondary | ICD-10-CM | POA: Insufficient documentation

## 2024-05-16 DIAGNOSIS — I5032 Chronic diastolic (congestive) heart failure: Secondary | ICD-10-CM | POA: Insufficient documentation

## 2024-05-16 NOTE — Patient Instructions (Signed)
 Medication Instructions:  Your physician recommends that you continue on your current medications as directed. Please refer to the Current Medication list given to you today.  Labwork: None ordered.  Testing/Procedures: None ordered.  Follow-Up:  As needed with Dr. Jimmey Ralph  Implantable Loop Recorder Placement, Care After This sheet gives you information about how to care for yourself after your procedure. Your health care provider may also give you more specific instructions. If you have problems or questions, contact your health care provider. What can I expect after the procedure? After the procedure, it is common to have: Soreness or discomfort near the incision. Some swelling or bruising near the incision.  Follow these instructions at home: Incision care  Monitor your cardiac device site for redness, swelling, and drainage. Call the device clinic at [phone] if you experience these symptoms or fever/chills.  Keep the large square bandage on your site for 24 hours and then you may remove it yourself. Keep the steri-strips underneath in place.   You may shower after 72 hours / 3 days from your procedure with the steri-strips in place. They will usually fall off on their own, or may be removed after 10 days. Pat dry.   Avoid lotions, ointments, or perfumes over your incision until it is well-healed.  Please do not submerge in water until your site is completely healed.   Your device is MRI compatible.   Remote monitoring is used to monitor your cardiac device from home. This monitoring is scheduled every month by our office. It allows [place] to keep an eye on the function of your device to ensure it is working properly.  If your wound site starts to bleed apply pressure.    For help with the monitor please call [employer] Monitor Support Specialist directly at [phone].    If you have any questions/concerns please call the device clinic at  [phone].  Activity  Return to your normal activities.  General instructions Follow instructions from your health care provider about how to manage your implantable loop recorder and transmit the information. Learn how to activate a recording if this is necessary for your type of device. You may go through a metal detection gate, and you may let someone hold a metal detector over your chest. Show your ID card if needed. Do not have an MRI unless you check with your health care provider first. Take over-the-counter and prescription medicines only as told by your health care provider. Keep all follow-up visits as told by your health care provider. This is important. Contact a health care provider if: You have redness, swelling, or pain around your incision. You have a fever. You have pain that is not relieved by your pain medicine. You have triggered your device because of fainting (syncope) or because of a heartbeat that feels like it is racing, slow, fluttering, or skipping (palpitations). Get help right away if you have: Chest pain. Difficulty breathing. Summary After the procedure, it is common to have soreness or discomfort near the incision. Change your dressing as told by your health care provider. Follow instructions from your health care provider about how to manage your implantable loop recorder and transmit the information. Keep all follow-up visits as told by your health care provider. This is important. This information is not intended to replace advice given to you by your health care provider. Make sure you discuss any questions you have with your health care provider. Document Released: 07/30/2015 Document Revised: 10/03/2017 Document Reviewed: 10/03/2017 Elsevier Patient  Education  [employer].

## 2024-05-16 NOTE — Progress Notes (Signed)
 Electrophysiology Office Note:   Date:  05/16/2024  ID:  Cindy Hammond, DOB Dec 08, 1965, MRN [id number]  Primary Cardiologist: Dub Huntsman, DO Electrophysiologist: Fonda Kitty, MD      History of Present Illness:   Cindy Hammond is a 58 y.o. female with h/o nonobstructive minimal coronary artery disease seen on coronary CTA done in January 2022, hypertension, hyperlipidemia, chronic diastolic heart failure who is being seen today for loop recorder implant at the request of Kardie Tobb, DO.  Discussed the use of AI scribe software for clinical note transcription with the patient, who gave verbal consent to proceed.  History of Present Illness Cindy Hammond is a 58 year old female who presents with episodes of syncope. She was referred by Dr. Huntsman for evaluation of syncope and consideration of a heart monitor.  She has experienced two episodes of syncope in the past month, characterized by lightheadedness and shortness of breath before losing consciousness. These episodes occur both while sitting still and during activities such as walking around her house or at church. During the most recent episode at church, she was assisted by a bystander before losing consciousness completely and was subsequently taken to the hospital by ambulance, where her blood pressure was noted to be very weak.  She previously wore a 30-day heart monitor but was only able to wear it for two weeks. During that period, no abnormal heart rhythms were detected. She mentions that her blood pressure monitor sometimes shows her heart rate going into the red zone, but she is unsure of the significance of this.  She reports that after a past cardiac catheterization, she was told her heart looked fine but that there was something going on at the back of her heart which could not be seen, and she was advised to see her lung doctor.  No new or acute complaints today.   Review of systems complete and found to be negative unless listed in  HPI.   EP Information / Studies Reviewed:    EKG is not ordered today. EKG from 04/25/24 reviewed which showed NSR with LAE.     Cardiac Monitor:  The patient wore the monitor for 30 days starting Jan 20, 2024. Indication: Dizziness The minimum heart rate was 66 bpm, maximum heart rate was   bpm, and average heart rate was 84 bpm. Predominant underlying rhythm was Sinus Rhythm.     Premature atrial complexes were  Premature Ventricular complexes No pauses, No AV block and no atrial fibrillation present. Symptoms associated with sinus rhythm   Conclusion: Normal/unremarkable study  LHC/RHC 01/12/24:    Mid LAD lesion is 20% stenosed.   Minimal luminal irregularities in the LAD No angiographic evidence of disease in the RCA or Circumflex Normal LV systolic function with LVEDP of 12 mmHg and PCWP of 5 Elevated RV and PA pressures (see hemodynamics for PVR) CO 5.07 L/min  CI 2.51   Physical Exam:   VS:  BP 112/74   Pulse (!) 102   Ht 5' (1.524 m)   Wt 240 lb (108.9 kg)   LMP  (LMP Unknown)   SpO2 97%   BMI 46.87 kg/m    Wt Readings from Last 3 Encounters:  05/16/24 240 lb (108.9 kg)  05/05/24 239 lb 6.4 oz (108.6 kg)  04/25/24 240 lb (108.9 kg)     GEN: Well nourished, well developed in no acute distress NECK: No JVD CARDIAC: Normal rate, regular rhythm RESPIRATORY:  Clear to auscultation without rales, wheezing or rhonchi  ABDOMEN: Obese, soft, non-distended EXTREMITIES:  No edema; No deformity   ASSESSMENT AND PLAN:    #Syncope: Multiple episodes. #Dizziness: - Discussed role of loop recorder implant and monitoring for an arrhythmia genic cause of syncope.  Discussed risk and benefits of loop recorder implant including but not limited to pain, bleeding, damage to nearby structures, infection.  Patient voiced understanding would like to proceed.  #Chronic diastolic heart failure: Well compensated on exam today. -Follow-up with general cardiology.  SURGEON:   Fonda Kitty, MD     PREPROCEDURE DIAGNOSIS:  Syncope    POSTPROCEDURE DIAGNOSIS: Syncope     PROCEDURES:   1. Implantable loop recorder implantation    INTRODUCTION:  Cindy Hammond presents with a history of syncope The costs of loop recorder monitoring have been discussed with the patient.    DESCRIPTION OF PROCEDURE:  Informed written consent was obtained.   Time Out Completed with RN    The patient required no sedation for the procedure today. The patients left chest was therefore prepped and draped in the usual sterile fashion. The skin overlying the left parasternal region was infiltrated with lidocaine  for local analgesia.  A 0.5-cm incision was made over the left parasternal region at the third intercostal space.  A subcutaneous ILR pocket was fashioned vertically along the sternal border using a combination of sharp and blunt dissection.  This was done because of the patient's body habitus and large amount of breast tissue.  A [employer] Reveal LINQ 2 implantable loop recorder (serial # [id number]) was then placed into this vertical pocket.  R waves were very prominent and measuring 0.65mV.  Steri- Strips and a sterile dressing were then applied.  There were no early apparent complications.     CONCLUSIONS:   1. Successful implantation of a implantable loop recorder for a history of Syncope.  2. No early apparent complications.   Follow up with general cardiology.  EP Team as needed.   Signed, Fonda Kitty, MD

## 2024-05-18 ENCOUNTER — Telehealth: Payer: Self-pay | Admitting: Cardiology

## 2024-05-18 ENCOUNTER — Other Ambulatory Visit: Payer: Self-pay | Admitting: Family Medicine

## 2024-05-18 ENCOUNTER — Ambulatory Visit: Admitting: Physician Assistant

## 2024-05-18 DIAGNOSIS — F419 Anxiety disorder, unspecified: Secondary | ICD-10-CM

## 2024-05-18 NOTE — Telephone Encounter (Signed)
 Patient reports she has a little bit of bleeding on the bandage at the incision site.  Patient wants advice on next steps.

## 2024-05-18 NOTE — Telephone Encounter (Signed)
 Spoke w/ patient regarding previous call into clinic. Patient states there was some bleeding on dressing placed over incision site upon removal. States the incision site is normal at this time and steri-strips remain in place. Informed small amount of bleeding on the bandage is normal. Advised to keep site clean and dry and monitor for any redness, swelling, or drainage.   Informed to call device clinic for any further questions or concerns. Appreciative for call.

## 2024-05-25 ENCOUNTER — Encounter: Payer: Self-pay | Admitting: Physician Assistant

## 2024-05-25 ENCOUNTER — Other Ambulatory Visit: Payer: Self-pay | Admitting: Physician Assistant

## 2024-05-25 DIAGNOSIS — R55 Syncope and collapse: Secondary | ICD-10-CM

## 2024-05-30 ENCOUNTER — Other Ambulatory Visit: Payer: Self-pay | Admitting: Physician Assistant

## 2024-05-30 DIAGNOSIS — E559 Vitamin D deficiency, unspecified: Secondary | ICD-10-CM

## 2024-06-03 ENCOUNTER — Other Ambulatory Visit

## 2024-06-03 LAB — COMPREHENSIVE METABOLIC PANEL WITH GFR
ALT: 16 IU/L (ref 0–32)
AST: 17 IU/L (ref 0–40)
Albumin: 3.8 g/dL (ref 3.8–4.9)
Alkaline Phosphatase: 189 IU/L — ABNORMAL HIGH (ref 49–135)
BUN/Creatinine Ratio: 13 (ref 9–23)
BUN: 14 mg/dL (ref 6–24)
Bilirubin Total: 0.7 mg/dL (ref 0.0–1.2)
CO2: 21 mmol/L (ref 20–29)
Calcium: 9.3 mg/dL (ref 8.7–10.2)
Chloride: 109 mmol/L — ABNORMAL HIGH (ref 96–106)
Creatinine, Ser: 1.11 mg/dL — ABNORMAL HIGH (ref 0.57–1.00)
Globulin, Total: 2.8 g/dL (ref 1.5–4.5)
Glucose: 101 mg/dL — ABNORMAL HIGH (ref 70–99)
Potassium: 4.2 mmol/L (ref 3.5–5.2)
Sodium: 142 mmol/L (ref 134–144)
Total Protein: 6.6 g/dL (ref 6.0–8.5)
eGFR: 58 mL/min/1.73 — ABNORMAL LOW (ref >59)

## 2024-06-03 LAB — CBC WITH DIFFERENTIAL/PLATELET
Basophils Absolute: 0.1 x10E3/uL (ref 0.0–0.2)
Basos: 2 %
EOS (ABSOLUTE): 0.4 x10E3/uL (ref 0.0–0.4)
Eos: 5 %
Hematocrit: 44.8 % (ref 34.0–46.6)
Hemoglobin: 14.8 g/dL (ref 11.1–15.9)
Immature Grans (Abs): 0 x10E3/uL (ref 0.0–0.1)
Immature Granulocytes: 0 %
Lymphocytes Absolute: 2.2 x10E3/uL (ref 0.7–3.1)
Lymphs: 29 %
MCH: 32.1 pg (ref 26.6–33.0)
MCHC: 33 g/dL (ref 31.5–35.7)
MCV: 97 fL (ref 79–97)
Monocytes Absolute: 0.7 x10E3/uL (ref 0.1–0.9)
Monocytes: 9 %
Neutrophils Absolute: 4.2 x10E3/uL (ref 1.4–7.0)
Neutrophils: 55 %
Platelets: 214 x10E3/uL (ref 150–450)
RBC: 4.61 x10E6/uL (ref 3.77–5.28)
RDW: 13.3 % (ref 11.7–15.4)
WBC: 7.6 x10E3/uL (ref 3.4–10.8)

## 2024-06-07 ENCOUNTER — Ambulatory Visit: Admitting: Physician Assistant

## 2024-06-07 ENCOUNTER — Encounter: Payer: Self-pay | Admitting: Physician Assistant

## 2024-06-07 VITALS — BP 150/110 | HR 101 | Temp 97.8°F | Resp 18 | Ht 60.0 in | Wt 244.8 lb

## 2024-06-07 DIAGNOSIS — R55 Syncope and collapse: Secondary | ICD-10-CM | POA: Diagnosis not present

## 2024-06-07 DIAGNOSIS — I10 Essential (primary) hypertension: Secondary | ICD-10-CM

## 2024-06-07 NOTE — Progress Notes (Signed)
 Subjective:  Patient ID: Cindy Hammond, female    DOB: [birth year]  Age: 58 y.o. MRN: [id number]  Chief Complaint  Patient presents with   Hypertension    Hypertension  Pt in today initially for follow up of syncopal spells.  She has established with neurologist and is set up for EEG within the next few weeks Pt has also seen cardiology and had LOOP recorder implanted on 05/26/24 -  She states she has not had any further syncopal spells since her last visit with me a month ago but has noted at home and at last visit with neurologist blood pressure has been elevated.  She had been taking atenolol  25mg  qd but had stopped that medication a few months ago when bp was running low Now bp today at 142/102 and pulse is 101 She denies chest pain or dyspnea     06/07/2024    1:16 PM 04/05/2024    2:50 PM 03/09/2024    2:00 PM 02/24/2024    2:56 PM 02/16/2024    2:40 PM  Depression screen PHQ 2/9  Decreased Interest 0 0 0 0 0  Down, Depressed, Hopeless  0 0 0 0  PHQ - 2 Score 0 0 0 0 0  Altered sleeping 0  0 0 0  Tired, decreased energy 2  1 1 1   Change in appetite 0  1 1 1   Feeling bad or failure about yourself  0  0 0 0  Trouble concentrating 0  0 0 0  Moving slowly or fidgety/restless 0      Suicidal thoughts 0  0 0 0  PHQ-9 Score 2  2 2 2   Difficult doing work/chores Not difficult at all    Not difficult at all        08/12/2023    9:24 AM 12/15/2023    8:10 AM 02/16/2024    2:40 PM 04/05/2024    2:50 PM 06/07/2024    1:16 PM  Fall Risk  Falls in the past year? 0 0 0 1 0  Was there an injury with Fall? 0 0 0 0 0  Fall Risk Category Calculator 0 0 0 2 0  Patient at Risk for Falls Due to No Fall Risks No Fall Risks No Fall Risks History of fall(s) No Fall Risks  Fall risk Follow up Falls evaluation completed  Falls evaluation completed Falls evaluation completed Falls evaluation completed     CONSTITUTIONAL: Negative for chills, fatigue, fever, u E/N/T: Negative for ear pain, nasal  congestion and sore throat.  CARDIOVASCULAR:see HPI RESPIRATORY: Negative for recent cough and dyspnea.  INTEGUMENTARY: Negative for rash.  NEUROLOGICAL:see HPI PSYCHIATRIC: Negative for sleep disturbance and to question depression screen.  Negative for depression, negative for anhedonia.       Current Outpatient Medications:    atenolol  (TENORMIN ) 25 MG tablet, Take 25 mg by mouth daily., Disp: , Rfl:    fenofibrate  (TRICOR ) 48 MG tablet, Take 1 tablet by mouth daily., Disp: , Rfl:    albuterol  (VENTOLIN  HFA) 108 (90 Base) MCG/ACT inhaler, INHALE 1-2 PUFFS INTO THE LUNGS EVERY 6 HOURS AS NEEDED FOR WHEEZING OR SHORTNESS OF BREATH, Disp: 8.5 g, Rfl: 2   amitriptyline  (ELAVIL ) 150 MG tablet, TAKE 1 TABLET BY MOUTH DAILY AT BEDTIME., Disp: 90 tablet, Rfl: 0   atorvastatin  (LIPITOR) 80 MG tablet, Take 1 tablet by mouth ONCE daily., Disp: 90 tablet, Rfl: 1   clonazePAM  (KLONOPIN ) 0.5 MG tablet, TAKE ONE TABLET BY MOUTH TWICE  DAILY, Disp: 60 tablet, Rfl: 1   EMGALITY 120 MG/ML SOAJ, SMARTSIG:2 Milliliter(s) SUB-Q Once, Disp: , Rfl:    KRILL OIL PO, Take 1 capsule by mouth daily., Disp: , Rfl:    meloxicam  (MOBIC ) 15 MG tablet, Take 1 tablet (15 mg total) by mouth daily., Disp: 30 tablet, Rfl: 2   montelukast (SINGULAIR) 10 MG tablet, Take 10 mg by mouth daily., Disp: , Rfl:    nitroGLYCERIN  (NITROSTAT ) 0.4 MG SL tablet, PLACE 1 TABLET UNDER THE TONGUE AS NEEDED FOR CHEST PAIN ( MAY REPEAT EVERY5 MINUTES X 3), Disp: 25 tablet, Rfl: 3   omeprazole  (PRILOSEC) 40 MG capsule, Take 1 capsule by mouth once daily., Disp: 90 capsule, Rfl: 1   OXYGEN , Inhale 1 L into the lungs at bedtime., Disp: , Rfl:    pregabalin  (LYRICA ) 75 MG capsule, Take 1 capsule by mouth 3 times daily., Disp: 270 capsule, Rfl: 0   promethazine (PHENERGAN) 25 MG tablet, Take 25 mg by mouth every 8 (eight) hours as needed., Disp: , Rfl:    QUEtiapine  (SEROQUEL ) 300 MG tablet, TAKE ONE TABLET BY MOUTH AT BEDTIME, Disp: 90 tablet,  Rfl: 1   ranolazine  (RANEXA ) 500 MG 12 hr tablet, Take 1 tablet by mouth twice daily., Disp: 60 tablet, Rfl: 11   topiramate  (TOPAMAX ) 100 MG tablet, Take 1 tablet by mouth 3 times daily., Disp: 270 tablet, Rfl: 0   Ubrogepant  (UBRELVY ) 100 MG TABS, Take 100 mg by mouth 2 (two) times daily as needed (migraine)., Disp: , Rfl:    Vitamin D , Ergocalciferol , (DRISDOL ) 1.25 MG (50000 UNIT) CAPS capsule, Take 1 capsule by mouth twice a week as directed, Disp: 10 capsule, Rfl: 1  Past Medical History:  Diagnosis Date   Anxiety    Depression    GERD (gastroesophageal reflux disease)    Hyperlipidemia    Hypertension    Pain in joint, lower leg    Thrombocytopenia    Objective:  PHYSICAL EXAM:   VS: BP (!) 150/110   Pulse (!) 101   Temp 97.8 F (36.6 C) (Temporal)   Resp 18   Ht 5' (1.524 m)   Wt 244 lb 12.8 oz (111 kg)   LMP  (LMP Unknown)   SpO2 95%   BMI 47.81 kg/m   GEN: Well nourished, well developed, in no acute distress  Cardiac: RRR; no murmurs, rubs, or gallops,no edema - Respiratory:  normal respiratory rate and pattern with no distress - normal breath sounds with no rales, rhonchi, wheezes or rubs Skin: warm and dry, no rash  Psych: euthymic mood, appropriate affect and demeanor   Assessment & Plan:    Syncope, unspecified syncope type Follow up with  Benign hypertension Restart atenolol  25mg  qd - monitor bp at home and recheck in office in 3 weeks   Follow-up: Return in about 2 months (around 08/07/2024) for chronic fasting follow-up and in 3 weeks for bp nurse check.  An After Visit Summary was printed and given to the patient.  CAMIE JONELLE NICHOLAUS DEVONNA [facility] [phone]

## 2024-06-08 ENCOUNTER — Encounter: Payer: Self-pay | Admitting: Physician Assistant

## 2024-06-10 ENCOUNTER — Encounter: Payer: Self-pay | Admitting: Physician Assistant

## 2024-06-13 ENCOUNTER — Ambulatory Visit: Admitting: Internal Medicine

## 2024-06-14 ENCOUNTER — Other Ambulatory Visit: Payer: Self-pay | Admitting: Physician Assistant

## 2024-06-14 DIAGNOSIS — K219 Gastro-esophageal reflux disease without esophagitis: Secondary | ICD-10-CM

## 2024-06-14 DIAGNOSIS — R55 Syncope and collapse: Secondary | ICD-10-CM | POA: Diagnosis not present

## 2024-06-17 ENCOUNTER — Other Ambulatory Visit: Payer: Self-pay | Admitting: Physician Assistant

## 2024-06-17 ENCOUNTER — Ambulatory Visit (INDEPENDENT_AMBULATORY_CARE_PROVIDER_SITE_OTHER)

## 2024-06-17 ENCOUNTER — Encounter

## 2024-06-17 DIAGNOSIS — R55 Syncope and collapse: Secondary | ICD-10-CM

## 2024-06-19 LAB — CUP PACEART REMOTE DEVICE CHECK
Date Time Interrogation Session: 20251016234256
Implantable Pulse Generator Implant Date: 20250915

## 2024-06-21 NOTE — Progress Notes (Signed)
 Remote Loop Recorder Transmission

## 2024-06-22 ENCOUNTER — Telehealth: Payer: Self-pay

## 2024-06-22 DIAGNOSIS — R55 Syncope and collapse: Secondary | ICD-10-CM

## 2024-06-22 NOTE — Progress Notes (Signed)
 Complex Care Management Note Care Guide Note  06/22/2024 Name: Zasha Belleau MRN: [id number] DOB: May 12, 1966   Complex Care Management Outreach Attempts: An unsuccessful telephone outreach was attempted today to offer the patient information about available complex care management services.  Follow Up Plan:  Additional outreach attempts will be made to offer the patient complex care management information and services.   Encounter Outcome:  No Answer  Dreama [facility]  [facility], [facility] VBCI Assistant Direct Dial: [phone]  Fax: [phone]

## 2024-06-25 ENCOUNTER — Ambulatory Visit: Payer: Self-pay | Admitting: Cardiology

## 2024-06-27 ENCOUNTER — Encounter: Payer: Self-pay | Admitting: Physician Assistant

## 2024-06-28 ENCOUNTER — Encounter: Payer: Self-pay | Admitting: Physician Assistant

## 2024-06-28 ENCOUNTER — Ambulatory Visit: Admitting: Physician Assistant

## 2024-06-28 ENCOUNTER — Ambulatory Visit

## 2024-06-28 VITALS — BP 128/102 | HR 94 | Temp 97.8°F | Ht 60.0 in | Wt 244.0 lb

## 2024-06-28 DIAGNOSIS — R3129 Other microscopic hematuria: Secondary | ICD-10-CM

## 2024-06-28 DIAGNOSIS — I1A0 Resistant hypertension: Secondary | ICD-10-CM | POA: Diagnosis not present

## 2024-06-28 DIAGNOSIS — R55 Syncope and collapse: Secondary | ICD-10-CM

## 2024-06-28 LAB — POCT URINALYSIS DIP (CLINITEK)
Bilirubin, UA: NEGATIVE
Glucose, UA: NEGATIVE mg/dL
Ketones, POC UA: NEGATIVE mg/dL
Nitrite, UA: NEGATIVE
POC PROTEIN,UA: NEGATIVE
Spec Grav, UA: 1.01 (ref 1.010–1.025)
Urobilinogen, UA: NEGATIVE U/dL
pH, UA: 6 (ref 5.0–8.0)

## 2024-06-28 MED ORDER — CEFDINIR 300 MG PO CAPS: 300.0000 mg | ORAL_CAPSULE | Freq: Two times a day (BID) | ORAL | 0 refills | Status: DC

## 2024-06-28 NOTE — Progress Notes (Signed)
 "  Subjective:  Patient ID: Cindy Hammond, female    DOB: 1966/07/08  Age: 58 y.o. MRN: [id number]  Chief Complaint  Patient presents with   Hypertension    3 week follow up    HPI:  Discussed the use of AI scribe software for clinical note transcription with the patient, who gave verbal consent to proceed.  History of Present Illness Cindy Hammond is a 58 year old female with hypertension who presents for blood pressure follow-up and syncope episodes.  She has experienced multiple episodes of syncope, with the most recent occurring last "Sunday and Monday. These episodes are sudden, without warning signs, and involve her losing consciousness. Her husband observed her eyes rolling back and noted she was unresponsive for about a minute. During these episodes, she is unable to hear anything, and her husband checks her breathing. The ambulance has been called twice, and she had difficulty detecting a pulse. She does not recall the events at the hospital. After regaining consciousness, her hearing gradually returns. The unpredictability of these episodes is concerning, as they can occur without warning, such as during church when she appeared to fall asleep and was unresponsive to her mother's attempts to wake her.  She has been experiencing headaches for the past one to two weeks, characterized by sharp, shooting pains on the right side of her head, particularly in the forehead area. These headaches occur randomly, even while sitting and watching TV. No blurry vision, abnormal tastes, or smells are associated with the headaches. She takes Ubrelvy for migraine relief, usually requiring two doses and rest in a dark, quiet room for the pain to subside.  She noticed blood in her urine last night, describing the toilet water as 'really red' but not bright red. She suspects it might be related to a dental procedure she underwent yesterday, where she may have swallowed some purple-tinted material used for  denture fitting. This morning, she observed a slight tinge in her urine, which was not bright red. She denies any history of hemorrhoids or blood in her stool.  She is currently taking atenolol for blood pressure management but reports that it is no longer effective. When she takes it, her blood pressure becomes too low, and when she skips doses, it runs high. She has a history of chronic kidney disease but denies any history of kidney stones.  She has a history of frequent urinary tract infections, occurring every month or two. She recalls experiencing a burning sensation during urination about three to four days ago, which has since resolved. She is allergic to sumatriptan and sulfa antibiotics. Amoxicillin has been used in the past for urinary tract infections.        10" /03/2024    1:16 PM 04/05/2024    2:50 PM 03/09/2024    2:00 PM 02/24/2024    2:56 PM 02/16/2024    2:40 PM  Depression screen PHQ 2/9  Decreased Interest 0 0 0 0 0  Down, Depressed, Hopeless  0 0 0 0  PHQ - 2 Score 0 0 0 0 0  Altered sleeping 0  0 0 0  Tired, decreased energy 2  1 1 1   Change in appetite 0  1 1 1   Feeling bad or failure about yourself  0  0 0 0  Trouble concentrating 0  0 0 0  Moving slowly or fidgety/restless 0      Suicidal thoughts 0  0 0 0  PHQ-9 Score 2  2 2  2  Difficult doing work/chores Not difficult at all    Not difficult at all        06/07/2024    1:16 PM  Fall Risk   Falls in the past year? 0  Number falls in past yr: 0  Injury with Fall? 0  Risk for fall due to : No Fall Risks  Follow up Falls evaluation completed    Patient Care Team: Nicholaus Credit, PA-C as PCP - General (Physician Assistant) Tobb, Kardie, DO as PCP - Cardiology (Cardiology) Kennyth Chew, MD as PCP - Electrophysiology (Cardiology)   Review of Systems  Constitutional:  Negative for appetite change, fatigue and fever.  HENT:  Negative for congestion, ear pain, sinus pressure and sore throat.   Respiratory:   Negative for cough, chest tightness, shortness of breath and wheezing.   Cardiovascular:  Negative for chest pain and palpitations.  Gastrointestinal:  Negative for abdominal pain, constipation, diarrhea, nausea and vomiting.  Genitourinary:  Positive for hematuria. Negative for dysuria.  Musculoskeletal:  Negative for arthralgias, back pain, joint swelling and myalgias.  Skin:  Negative for rash.  Neurological:  Positive for syncope. Negative for dizziness, weakness and headaches.  Psychiatric/Behavioral:  Negative for dysphoric mood. The patient is not nervous/anxious.     Current Outpatient Medications on File Prior to Visit  Medication Sig Dispense Refill   albuterol  (VENTOLIN  HFA) 108 (90 Base) MCG/ACT inhaler INHALE 1-2 PUFFS INTO THE LUNGS EVERY 6 HOURS AS NEEDED FOR WHEEZING OR SHORTNESS OF BREATH 8.5 g 2   amitriptyline  (ELAVIL ) 150 MG tablet TAKE 1 TABLET BY MOUTH DAILY AT BEDTIME. 90 tablet 0   atorvastatin  (LIPITOR) 80 MG tablet Take 1 tablet by mouth ONCE daily. 90 tablet 1   clonazePAM  (KLONOPIN ) 0.5 MG tablet TAKE ONE TABLET BY MOUTH TWICE DAILY 60 tablet 1   EMGALITY 120 MG/ML SOAJ SMARTSIG:2 Milliliter(s) SUB-Q Once     fenofibrate  (TRICOR ) 48 MG tablet Take 1 tablet by mouth daily.     KRILL OIL PO Take 1 capsule by mouth daily.     meloxicam  (MOBIC ) 15 MG tablet Take 1 tablet by mouth once daily. 90 tablet 1   montelukast (SINGULAIR) 10 MG tablet Take 10 mg by mouth daily.     nitroGLYCERIN  (NITROSTAT ) 0.4 MG SL tablet PLACE 1 TABLET UNDER THE TONGUE AS NEEDED FOR CHEST PAIN ( MAY REPEAT EVERY5 MINUTES X 3) 25 tablet 3   omeprazole  (PRILOSEC) 40 MG capsule Take 1 capsule by mouth once daily. 90 capsule 1   OXYGEN  Inhale 1 L into the lungs at bedtime.     pregabalin  (LYRICA ) 75 MG capsule Take 1 capsule by mouth 3 times daily. 270 capsule 0   promethazine (PHENERGAN) 25 MG tablet Take 25 mg by mouth every 8 (eight) hours as needed.     QUEtiapine  (SEROQUEL ) 300 MG tablet  TAKE ONE TABLET BY MOUTH AT BEDTIME 90 tablet 1   ranolazine  (RANEXA ) 500 MG 12 hr tablet Take 1 tablet by mouth twice daily. 60 tablet 11   topiramate  (TOPAMAX ) 100 MG tablet Take 1 tablet by mouth 3 times daily. 270 tablet 0   Ubrogepant  (UBRELVY ) 100 MG TABS Take 100 mg by mouth 2 (two) times daily as needed (migraine).     Vitamin D , Ergocalciferol , (DRISDOL ) 1.25 MG (50000 UNIT) CAPS capsule Take 1 capsule by mouth twice a week as directed 10 capsule 1   atenolol  (TENORMIN ) 25 MG tablet TAKE 1 TABLET BY MOUTH TWICE(2) DAILY (Patient not taking: Reported  on 06/28/2024) 180 tablet 3   No current facility-administered medications on file prior to visit.   Past Medical History:  Diagnosis Date   Anxiety    Depression    GERD (gastroesophageal reflux disease)    Hyperlipidemia    Hypertension    Pain in joint, lower leg    Thrombocytopenia    Past Surgical History:  Procedure Laterality Date   RIGHT/LEFT HEART CATH AND CORONARY ANGIOGRAPHY N/A 01/12/2024   Procedure: RIGHT/LEFT HEART CATH AND CORONARY ANGIOGRAPHY;  Surgeon: Verlin Lonni BIRCH, MD;  Location: [facility];  Service: Cardiovascular;  Laterality: N/A;   TUBAL LIGATION      Family History  Problem Relation Age of Onset   Hyperlipidemia Mother    Hypertension Mother    Hyperlipidemia Father    Hypertension Father    Social History   Socioeconomic History   Marital status: Married    Spouse name: Not on file   Number of children: 3   Years of education: Not on file   Highest education level: GED or equivalent  Occupational History   Occupation: Unemployed  Tobacco Use   Smoking status: Some Days    Current packs/day: 0.50    Average packs/day: 0.5 packs/day for 35.8 years (17.9 ttl pk-yrs)    Types: Cigarettes    Start date: 102   Smokeless tobacco: Never  Vaping Use   Vaping status: Never Used  Substance and Sexual Activity   Alcohol use: No    Alcohol/week: 0.0 standard drinks of alcohol    Drug use: No   Sexual activity: Yes    Partners: Male  Other Topics Concern   Not on file  Social History Narrative   Not on file   Social Drivers of Health   Financial Resource Strain: Medium Risk (02/01/2024)   Overall Financial Resource Strain (CARDIA)    Difficulty of Paying Living Expenses: Somewhat hard  Food Insecurity: No Food Insecurity (02/01/2024)   Hunger Vital Sign    Worried About Running Out of Food in the Last Year: Never true    Ran Out of Food in the Last Year: Never true  Transportation Needs: No Transportation Needs (02/01/2024)   PRAPARE - Administrator, Civil Service (Medical): No    Lack of Transportation (Non-Medical): No  Physical Activity: Insufficiently Active (02/01/2024)   Exercise Vital Sign    Days of Exercise per Week: 1 day    Minutes of Exercise per Session: 10 min  Stress: No Stress Concern Present (02/01/2024)   [employer] of Occupational Health - Occupational Stress Questionnaire    Feeling of Stress : Not at all  Social Connections: Socially Integrated (02/01/2024)   Social Connection and Isolation Panel    Frequency of Communication with Friends and Family: More than three times a week    Frequency of Social Gatherings with Friends and Family: Twice a week    Attends Religious Services: 1 to 4 times per year    Active Member of [employer] or Organizations: Yes    Attends Banker Meetings: 1 to 4 times per year    Marital Status: Married    Objective:  BP (!) 128/102 (BP Location: Left Arm, Patient Position: Sitting)   Pulse 94   Temp 97.8 F (36.6 C) (Temporal)   Ht 5' (1.524 m)   Wt 244 lb (110.7 kg)   LMP  (LMP Unknown)   SpO2 94%   BMI 47.65 kg/m  06/28/2024    1:42 PM 06/07/2024    1:13 PM 05/16/2024    1:36 PM  BP/Weight  Systolic BP 128 150 112  Diastolic BP 102 110 74  Wt. (Lbs) 244 244.8 240  BMI 47.65 kg/m2 47.81 kg/m2 46.87 kg/m2    Physical Exam Vitals reviewed.  Constitutional:       Appearance: She is obese.  Neck:     Vascular: No carotid bruit.  Cardiovascular:     Rate and Rhythm: Normal rate and regular rhythm.     Heart sounds: Normal heart sounds.  Pulmonary:     Effort: Pulmonary effort is normal.     Breath sounds: Normal breath sounds.  Abdominal:     General: Bowel sounds are normal.     Palpations: Abdomen is soft.     Tenderness: There is no abdominal tenderness.  Neurological:     Mental Status: She is alert and oriented to person, place, and time.  Psychiatric:        Mood and Affect: Mood normal.        Behavior: Behavior normal.      Lab Results  Component Value Date   WBC 7.6 06/03/2024   HGB 14.8 06/03/2024   HCT 44.8 06/03/2024   PLT 214 06/03/2024   GLUCOSE 101 (H) 06/03/2024   CHOL 170 12/15/2023   TRIG 157 (H) 12/15/2023   HDL 46 12/15/2023   LDLCALC 97 12/15/2023   ALT 16 06/03/2024   AST 17 06/03/2024   NA 142 06/03/2024   K 4.2 06/03/2024   CL 109 (H) 06/03/2024   CREATININE 1.11 (H) 06/03/2024   BUN 14 06/03/2024   CO2 21 06/03/2024   TSH 1.690 04/05/2024   INR 1.1 02/01/2024   HGBA1C 5.6 12/15/2023    Results for orders placed or performed in visit on 06/28/24  POCT URINALYSIS DIP (CLINITEK)   Collection Time: 06/28/24  3:00 PM  Result Value Ref Range   Color, UA     Clarity, UA     Glucose, UA negative negative mg/dL   Bilirubin, UA negative negative   Ketones, POC UA negative negative mg/dL   Spec Grav, UA 8.989 8.989 - 1.025   Blood, UA large (A) negative   pH, UA 6.0 5.0 - 8.0   POC PROTEIN,UA negative negative, trace   Urobilinogen, UA negative 0.2 or 1.0 E.U./dL   Nitrite, UA Negative Negative   Leukocytes, UA Moderate (2+) (A) Negative  .Total time spent on today's visit was 30 minutes, including both face-to-face time and nonface-to-face time personally spent on review of chart (labs and imaging), discussing labs and goals, discussing further work-up, treatment options, referrals to specialist if  needed, reviewing outside records of pertinent, answering patient's questions, and coordinating care.   Assessment & Plan:   Assessment & Plan Other microscopic hematuria Urinary tract infection Blood and white blood cells in urine indicate UTI. Burning sensation during urination resolved. - Prescribe amoxicillin  for UTI. - Send urine sample for culture to confirm infection and adjust antibiotics if necessary. - Report any worsening symptoms. Orders:   POCT URINALYSIS DIP (CLINITEK)   Urine Culture   cefdinir  (OMNICEF ) 300 MG capsule; Take 1 capsule (300 mg total) by mouth 2 (two) times daily.  Resistant hypertension Hypertension with chronic kidney disease Hypertension management complicated by chronic kidney disease. Atenolol  causing blood pressure fluctuations and may contribute to syncope. BP 136/105. Consider switching to valsartan or olmesartan for kidney protection. - Cut atenolol  dose in half  to manage blood pressure and prevent syncope. - Discuss potential switch to valsartan or olmesartan with cardiology. - Follow up with cardiology in mid-November for hypertension and chronic kidney disease management. BP Readings from Last 3 Encounters:  06/28/24 (!) 128/102  06/07/24 (!) 150/110  05/16/24 112/74       Syncope, unspecified syncope type Syncope and drop attacks Recurrent syncope with loss of consciousness. CT scan normal. Differential includes drop seizures. Atenolol  may contribute to hypotension and syncope. - Schedule EEG for syncope and potential drop seizures. - Cut atenolol  dose in half to prevent hypotension and syncope until cardiology or primary care follow-up. - Follow up with neurology for EEG results. - Return to clinic if syncope persists or worsens.      Body mass index is 47.65 kg/m.     Meds ordered this encounter  Medications   cefdinir  (OMNICEF ) 300 MG capsule    Sig: Take 1 capsule (300 mg total) by mouth 2 (two) times daily.     Dispense:  14 capsule    Refill:  0    Orders Placed This Encounter  Procedures   Urine Culture   POCT URINALYSIS DIP (CLINITEK)       Follow-up: No follow-ups on file.  An After Visit Summary was printed and given to the patient.    I,Lauren M Auman,acting as a neurosurgeon for [employer], [employer].,have documented all relevant documentation on the behalf of Cindy Angles, PA,as directed by  Cindy Angles, PA while in the presence of Cindy Hammond, Cindy Hammond.    Cindy Hammond, Cindy Hammond [facility] [phone] "

## 2024-06-29 DIAGNOSIS — R55 Syncope and collapse: Secondary | ICD-10-CM | POA: Diagnosis not present

## 2024-06-29 NOTE — Assessment & Plan Note (Signed)
 Hypertension with chronic kidney disease Hypertension management complicated by chronic kidney disease. Atenolol  causing blood pressure fluctuations and may contribute to syncope. BP 136/105. Consider switching to valsartan or olmesartan for kidney protection. - Cut atenolol  dose in half to manage blood pressure and prevent syncope. - Discuss potential switch to valsartan or olmesartan with cardiology. - Follow up with cardiology in mid-November for hypertension and chronic kidney disease management. BP Readings from Last 3 Encounters:  06/28/24 (!) 128/102  06/07/24 (!) 150/110  05/16/24 112/74

## 2024-06-29 NOTE — Assessment & Plan Note (Signed)
 Syncope and drop attacks Recurrent syncope with loss of consciousness. CT scan normal. Differential includes drop seizures. Atenolol  may contribute to hypotension and syncope. - Schedule EEG for syncope and potential drop seizures. - Cut atenolol  dose in half to prevent hypotension and syncope until cardiology or primary care follow-up. - Follow up with neurology for EEG results. - Return to clinic if syncope persists or worsens.

## 2024-06-29 NOTE — Progress Notes (Signed)
 Complex Care Management Note  Care Guide Note 06/29/2024 Name: Cindy Hammond MRN: [id number] DOB: [birth year]  Cindy Hammond is a 58 y.o. year old female who sees Nicholaus Credit, PA-C for primary care. I reached out to Dagoberto Blush by phone today to offer complex care management services.  Cindy Hammond was given information about Complex Care Management services today including:   The Complex Care Management services include support from the care team which includes your Nurse Care Manager, Clinical Social Worker, or Pharmacist.  The Complex Care Management team is here to help remove barriers to the health concerns and goals most important to you. Complex Care Management services are voluntary, and the patient may decline or stop services at any time by request to their care team member.   Complex Care Management Consent Status: Patient did not agree to participate in complex care management services at this time.  Encounter Outcome:  Patient Refused  Dreama Agent [facility], [facility]BCI Assistant Direct Dial: [phone]  Fax: [phone]

## 2024-06-30 LAB — URINE CULTURE: Organism ID, Bacteria: NO GROWTH

## 2024-07-01 DIAGNOSIS — N132 Hydronephrosis with renal and ureteral calculous obstruction: Secondary | ICD-10-CM | POA: Diagnosis not present

## 2024-07-01 DIAGNOSIS — R100 Acute abdomen: Secondary | ICD-10-CM | POA: Diagnosis not present

## 2024-07-01 DIAGNOSIS — N201 Calculus of ureter: Secondary | ICD-10-CM | POA: Diagnosis not present

## 2024-07-02 DIAGNOSIS — E662 Morbid (severe) obesity with alveolar hypoventilation: Secondary | ICD-10-CM | POA: Diagnosis not present

## 2024-07-02 DIAGNOSIS — R0902 Hypoxemia: Secondary | ICD-10-CM | POA: Diagnosis not present

## 2024-07-02 DIAGNOSIS — I2721 Secondary pulmonary arterial hypertension: Secondary | ICD-10-CM | POA: Diagnosis not present

## 2024-07-02 DIAGNOSIS — N201 Calculus of ureter: Secondary | ICD-10-CM | POA: Diagnosis not present

## 2024-07-02 DIAGNOSIS — R55 Syncope and collapse: Secondary | ICD-10-CM | POA: Diagnosis not present

## 2024-07-02 DIAGNOSIS — G8929 Other chronic pain: Secondary | ICD-10-CM | POA: Diagnosis not present

## 2024-07-02 DIAGNOSIS — F419 Anxiety disorder, unspecified: Secondary | ICD-10-CM | POA: Diagnosis not present

## 2024-07-02 DIAGNOSIS — I509 Heart failure, unspecified: Secondary | ICD-10-CM | POA: Diagnosis not present

## 2024-07-02 DIAGNOSIS — N202 Calculus of kidney with calculus of ureter: Secondary | ICD-10-CM | POA: Diagnosis not present

## 2024-07-02 DIAGNOSIS — N132 Hydronephrosis with renal and ureteral calculous obstruction: Secondary | ICD-10-CM | POA: Diagnosis not present

## 2024-07-02 DIAGNOSIS — Z87442 Personal history of urinary calculi: Secondary | ICD-10-CM | POA: Diagnosis not present

## 2024-07-02 DIAGNOSIS — I517 Cardiomegaly: Secondary | ICD-10-CM | POA: Diagnosis not present

## 2024-07-02 DIAGNOSIS — G43909 Migraine, unspecified, not intractable, without status migrainosus: Secondary | ICD-10-CM | POA: Diagnosis not present

## 2024-07-02 DIAGNOSIS — K219 Gastro-esophageal reflux disease without esophagitis: Secondary | ICD-10-CM | POA: Diagnosis not present

## 2024-07-02 DIAGNOSIS — Z87891 Personal history of nicotine dependence: Secondary | ICD-10-CM | POA: Diagnosis not present

## 2024-07-02 DIAGNOSIS — N136 Pyonephrosis: Secondary | ICD-10-CM | POA: Diagnosis not present

## 2024-07-02 DIAGNOSIS — I69351 Hemiplegia and hemiparesis following cerebral infarction affecting right dominant side: Secondary | ICD-10-CM | POA: Diagnosis not present

## 2024-07-02 DIAGNOSIS — G928 Other toxic encephalopathy: Secondary | ICD-10-CM | POA: Diagnosis not present

## 2024-07-02 DIAGNOSIS — J4489 Other specified chronic obstructive pulmonary disease: Secondary | ICD-10-CM | POA: Diagnosis not present

## 2024-07-02 DIAGNOSIS — I272 Pulmonary hypertension, unspecified: Secondary | ICD-10-CM | POA: Diagnosis not present

## 2024-07-02 DIAGNOSIS — I2510 Atherosclerotic heart disease of native coronary artery without angina pectoris: Secondary | ICD-10-CM | POA: Diagnosis not present

## 2024-07-02 DIAGNOSIS — K760 Fatty (change of) liver, not elsewhere classified: Secondary | ICD-10-CM | POA: Diagnosis not present

## 2024-07-02 DIAGNOSIS — N1831 Chronic kidney disease, stage 3a: Secondary | ICD-10-CM | POA: Diagnosis not present

## 2024-07-02 DIAGNOSIS — J4540 Moderate persistent asthma, uncomplicated: Secondary | ICD-10-CM | POA: Diagnosis not present

## 2024-07-02 DIAGNOSIS — G510 Bell's palsy: Secondary | ICD-10-CM | POA: Diagnosis not present

## 2024-07-02 DIAGNOSIS — R0989 Other specified symptoms and signs involving the circulatory and respiratory systems: Secondary | ICD-10-CM | POA: Diagnosis not present

## 2024-07-02 DIAGNOSIS — Z6841 Body Mass Index (BMI) 40.0 and over, adult: Secondary | ICD-10-CM | POA: Diagnosis not present

## 2024-07-02 DIAGNOSIS — M797 Fibromyalgia: Secondary | ICD-10-CM | POA: Diagnosis not present

## 2024-07-02 DIAGNOSIS — I130 Hypertensive heart and chronic kidney disease with heart failure and stage 1 through stage 4 chronic kidney disease, or unspecified chronic kidney disease: Secondary | ICD-10-CM | POA: Diagnosis not present

## 2024-07-02 DIAGNOSIS — J9601 Acute respiratory failure with hypoxia: Secondary | ICD-10-CM | POA: Diagnosis not present

## 2024-07-02 DIAGNOSIS — I071 Rheumatic tricuspid insufficiency: Secondary | ICD-10-CM | POA: Diagnosis not present

## 2024-07-02 DIAGNOSIS — I519 Heart disease, unspecified: Secondary | ICD-10-CM | POA: Diagnosis not present

## 2024-07-02 DIAGNOSIS — E785 Hyperlipidemia, unspecified: Secondary | ICD-10-CM | POA: Diagnosis not present

## 2024-07-03 DIAGNOSIS — J810 Acute pulmonary edema: Secondary | ICD-10-CM | POA: Diagnosis not present

## 2024-07-04 ENCOUNTER — Ambulatory Visit: Payer: Self-pay | Admitting: Physician Assistant

## 2024-07-04 ENCOUNTER — Ambulatory Visit: Payer: Self-pay

## 2024-07-04 DIAGNOSIS — R4182 Altered mental status, unspecified: Secondary | ICD-10-CM | POA: Diagnosis not present

## 2024-07-04 DIAGNOSIS — N202 Calculus of kidney with calculus of ureter: Secondary | ICD-10-CM | POA: Diagnosis not present

## 2024-07-04 DIAGNOSIS — N201 Calculus of ureter: Secondary | ICD-10-CM | POA: Diagnosis not present

## 2024-07-04 DIAGNOSIS — I4510 Unspecified right bundle-branch block: Secondary | ICD-10-CM | POA: Diagnosis not present

## 2024-07-04 DIAGNOSIS — R55 Syncope and collapse: Secondary | ICD-10-CM | POA: Diagnosis not present

## 2024-07-04 DIAGNOSIS — J9691 Respiratory failure, unspecified with hypoxia: Secondary | ICD-10-CM | POA: Diagnosis not present

## 2024-07-04 NOTE — Telephone Encounter (Signed)
 FYI Only or Action Required?: Action required by provider: clinical question for provider and update on patient condition.  Patient was last seen in primary care on 06/28/2024 by Milon Cleaves, PA.  Called Nurse Triage reporting PCP Call.    Triage Disposition: Call PCP Now  Patient/caregiver understands and will follow disposition?: No, wishes to speak with PCP  **See note below**          Copied from CRM #[id number]. Topic: Clinical - Red Word Triage >> Jul 04, 2024  3:38 PM Rosaria BRAVO wrote: Red Word that prompted transfer to Nurse Triage: Pt is turning blue, BP medication issues. Black out spells, fainting/falling   ----------------------------------------------------------------------- From previous Reason for Contact - Medical Advice: Reason for CRM: Pt's husband just called and wants to speak to the clinic concerning the patients surgery/medication. Does not have medicine.   Best contact: [phone] Reason for Disposition  [1] Caller requests to speak ONLY to PCP AND [2] URGENT question  Answer Assessment - Initial Assessment Questions 1. REASON FOR CALL or QUESTION: What is your reason for calling today? or How can I best  Patient was seen in ED on last Thursday for kidney stones. Husband states she was given Flomax. The stone on the right side was too large, so she had stents placed at that time. While in the hospital husband thought she had a seizure in which per the hospital staff it was not. During her stay she was reported to turn blue and was feeling faint. During hospital stay husband stated her B/P was low.  She was discharged today 07/04/24 and was advised to follow up with PCP. Hospital follow-up appt. Offered, but declined as patient is unable to make it due to the stents placed. Husband also wants to know if patient should continue to take regular B/P medications. Please advise.  Protocols used: PCP Call - No Triage-A-AH

## 2024-07-04 NOTE — Telephone Encounter (Signed)
 called PATIENT MADE HER AWARE PER PROVIDER, SHE WILL NEED TO CALL HEART DOCTOR AND SCHEDULE APPOINTMENT TO DISCUSS RECOMMENDATION THAT WAS MADE BY BRADY CRAFT, PA-C ON 10/28.  REACH OUT TO HEART CARE CMA TO SEE IF THEY CAN CALL AND GET PATIENT IN TO SEE Heart provider. Patient made aware to call or send message to me through my chart if she has not heard from any one by lunch time tomorrow.

## 2024-07-05 ENCOUNTER — Telehealth: Payer: Self-pay | Admitting: Cardiology

## 2024-07-05 NOTE — Telephone Encounter (Signed)
 Called patient and made her aware, that her cardiologist appointment will be 07/11/24 at 12:40PM

## 2024-07-05 NOTE — Telephone Encounter (Signed)
 Left voice message to call back 11/4

## 2024-07-05 NOTE — Telephone Encounter (Signed)
 Pt c/o medication issue:  1. Name of Medication:   atenolol  (TENORMIN ) 25 MG tablet    2. How are you currently taking this medication (dosage and times per day)?  TAKE 1 TABLET BY MOUTH TWICE(2) DAILYPatient not taking: Reported on 06/28/2024      3. Are you having a reaction (difficulty breathing--STAT)? No  4. What is your medication issue? Pt stated she was in hospital recently and she has concerns with this medication. Please advise

## 2024-07-06 ENCOUNTER — Telehealth: Payer: Self-pay

## 2024-07-06 NOTE — Transitions of Care (Post Inpatient/ED Visit) (Signed)
 07/06/2024  Name: Cindy Hammond MRN: [id number] DOB: [birth year]  Today's TOC FU Call Status: Today's TOC FU Call Status:: Successful TOC FU Call Completed TOC FU Call Complete Date: 07/06/24 Patient's Name and Date of Birth confirmed.  Transition Care Management Follow-up Telephone Call Date of Discharge: 07/04/24 Discharge Facility: Other [facility]) Name of Other ([facility]) Discharge Facility: [facility] Type of Discharge: Inpatient Admission Primary Inpatient Discharge Diagnosis:: Ureteral stone How have you been since you were released from the hospital?: Better (sore,) Any questions or concerns?: No  Items Reviewed: Did you receive and understand the discharge instructions provided?: Yes Medications obtained,verified, and reconciled?: Yes (Medications Reviewed) Any new allergies since your discharge?: No Dietary orders reviewed?: Yes Type of Diet Ordered:: regular diet Do you have support at home?: Yes People in Home [RPT]: spouse Name of Support/Comfort Primary Source: [place]  Medications Reviewed Today: Medications Reviewed Today     Reviewed by Rumalda Alan PENNER, RN (Registered Nurse) on 07/06/24 at [phone]  Med List Status: <None>   Medication Order Taking? Sig Documenting Provider Last Dose Status Informant  albuterol  (VENTOLIN  HFA) 108 (90 Base) MCG/ACT inhaler [id number] Yes INHALE 1-2 PUFFS INTO THE LUNGS EVERY 6 HOURS AS NEEDED FOR WHEEZING OR SHORTNESS OF BREATH Nicholaus Credit, PA-C  Active Self  amitriptyline  (ELAVIL ) 150 MG tablet [id number] Yes TAKE 1 TABLET BY MOUTH DAILY AT BEDTIME. Nicholaus Credit, PA-C  Active   atenolol  (TENORMIN ) 25 MG tablet [id number] Yes TAKE 1 TABLET BY MOUTH TWICE(2) DAILY  Patient taking differently: Take 12.5 mg by mouth daily.   Tobb, Kardie, DO  Active   atorvastatin  (LIPITOR) 80 MG tablet [id number] Yes Take 1 tablet by mouth ONCE daily. Nicholaus Credit, PA-C  Active   bumetanide  (BUMEX ) 2 MG tablet [id number] Yes Take 2 mg by mouth daily.  [provider]  Active   cefdinir (OMNICEF) 300 MG capsule [id number] Yes Take 1 capsule (300 mg total) by mouth 2 (two) times daily. Milon Cleaves, PA  Active   clonazePAM  (KLONOPIN ) 0.5 MG tablet [id number] Yes TAKE ONE TABLET BY MOUTH TWICE DAILY  Patient taking differently: TAKE ONE TABLET BY MOUTH TWICE DAILY   Nicholaus Credit, PA-C  Active   dicyclomine  (BENTYL ) 20 MG tablet [id number]  Take 20 mg by mouth as directed.  Patient not taking: Reported on 07/06/2024   [provider]  Active   EMGALITY 120 MG/ML EMMANUEL [id number] Yes SMARTSIG:2 Milliliter(s) SUB-Q Once [provider]  Active   fenofibrate  (TRICOR ) 48 MG tablet [id number]  Take 1 tablet by mouth daily.  Patient not taking: Reported on 07/06/2024   [provider]  Active   KRILL OIL PO [id number]  Take 1 capsule by mouth daily.  Patient not taking: Reported on 07/06/2024   [provider]  Active Self  meloxicam  (MOBIC ) 15 MG tablet [id number] Yes Take 1 tablet by mouth once daily. Nicholaus Credit, PA-C  Active   montelukast (SINGULAIR) 10 MG tablet [id number] Yes Take 10 mg by mouth daily. [provider]  Active Self  nitroGLYCERIN  (NITROSTAT ) 0.4 MG SL tablet [id number]  PLACE 1 TABLET UNDER THE TONGUE AS NEEDED FOR CHEST PAIN ( MAY REPEAT EVERY5 MINUTES X 3)  Patient not taking: Reported on 07/06/2024   Tobb, Kardie, DO  Active Self  omeprazole  (PRILOSEC) 40 MG capsule [id number] Yes Take 1 capsule by mouth once daily.  Patient taking differently: Take 40 mg by mouth daily. Takes as needed   Nicholaus Credit, PA-C  Active  OXYGEN  [id number] Yes Inhale 1 L into the lungs at bedtime.  Patient taking differently: Inhale 1.5 L into the lungs at bedtime.   [provider]  Active Self  pregabalin  (LYRICA ) 75 MG capsule [id number] Yes Take 1 capsule by mouth 3 times daily. Nicholaus Credit, PA-C  Active   promethazine (PHENERGAN) 25 MG tablet [id number] Yes Take 25 mg by mouth every 8 (eight)  hours as needed. [provider]  Active   QUEtiapine  (SEROQUEL ) 300 MG tablet [id number] Yes TAKE ONE TABLET BY MOUTH AT BEDTIME  Patient taking differently: Take 300 mg by mouth at bedtime. Changed at discharge to half a tablet at bedtime   Nicholaus Credit, PA-C  Active   ranolazine  (RANEXA ) 500 MG 12 hr tablet [id number] Yes Take 1 tablet by mouth twice daily.  Patient taking differently: Take 1 tablet by mouth twice daily.   Tobb, Kardie, DO  Active Self  topiramate  (TOPAMAX ) 100 MG tablet [id number] Yes Take 1 tablet by mouth 3 times daily.  Patient taking differently: Take 100 mg by mouth daily.   Nicholaus Credit, PA-C  Active   Ubrogepant  (UBRELVY ) 100 MG TABS [id number] Yes Take 100 mg by mouth 2 (two) times daily as needed (migraine). [provider]  Active Self  Vitamin D , Ergocalciferol , (DRISDOL ) 1.25 MG (50000 UNIT) CAPS capsule [id number] Yes Take 1 capsule by mouth twice a week as directed Nicholaus Credit, PA-C  Active             Home Care and Equipment/Supplies: Were Home Health Services Ordered?: No Any new equipment or medical supplies ordered?: Yes Name of Medical supply agency?: [employer] Were you able to get the equipment/medical supplies?: Yes Do you have any questions related to the use of the equipment/supplies?: No  Functional Questionnaire: Do you need assistance with bathing/showering or dressing?: Yes (husband) Do you need assistance with meal preparation?: No Do you need assistance with eating?: No Do you have difficulty maintaining continence: No Do you need assistance with getting out of bed/getting out of a chair/moving?: No Do you have difficulty managing or taking your medications?: No  Follow up appointments reviewed: PCP Follow-up appointment confirmed?: Yes Date of PCP follow-up appointment?: 07/07/24 Follow-up Provider: PCP Specialist Hospital Follow-up appointment confirmed?: Yes Date of Specialist follow-up appointment?:  07/11/24 Follow-Up Specialty Provider:: Cardiology Do you need transportation to your follow-up appointment?: No Do you understand care options if your condition(s) worsen?: Yes-patient verbalized understanding  SDOH Interventions Today    Flowsheet Row Most Recent Value  SDOH Interventions   Food Insecurity Interventions Intervention Not Indicated  Housing Interventions Intervention Not Indicated  Transportation Interventions Intervention Not Indicated  Utilities Interventions Intervention Not Indicated   Placed call to office to inquire about getting a sooner appointment due to respiratory issues that occurred during hospitalization.  Spoke with Ginnie Nicholaus PA and patient is now schedule for follow up.  Extensive reviewed with patient about her medications and taking her sedating medications all at bedtime. Reviewed with patient how this intensifies the sedation affect.  Reviewed that patient is using her oxygen  at night .  Discussed with patient the need to see her therapist .  Reports that she did not like to see a man and will call and schedule with a female therapist.   During second call with patient she now reports that she is will see pulmonary on 07/14/2024.  Reviewed with  patient to take her medications only as prescribed. Reviewed importance of getting a battery for  her BP machine.  Reviewed follow up. Reviewed all discharge instructions Reviewed all medications Reviewed when to call 911.  Encouraged patient to use her oxygen  as prescribed.  Reviewed and offered 30 day TOC program and patient has declined. Encouraged patient to call me back for questions or concerns. Provided my contact phone number. Great collaboration with PCP via phone.    Alan Ee, RN, BSN, CEN [employer]- Transition of Care Team.  [facility] [phone]

## 2024-07-07 ENCOUNTER — Ambulatory Visit (INDEPENDENT_AMBULATORY_CARE_PROVIDER_SITE_OTHER): Admitting: Physician Assistant

## 2024-07-07 VITALS — BP 118/88 | HR 78 | Temp 98.1°F | Wt 233.6 lb

## 2024-07-07 DIAGNOSIS — I1A0 Resistant hypertension: Secondary | ICD-10-CM | POA: Diagnosis not present

## 2024-07-07 DIAGNOSIS — R899 Unspecified abnormal finding in specimens from other organs, systems and tissues: Secondary | ICD-10-CM | POA: Diagnosis not present

## 2024-07-07 DIAGNOSIS — F419 Anxiety disorder, unspecified: Secondary | ICD-10-CM | POA: Diagnosis not present

## 2024-07-07 DIAGNOSIS — R55 Syncope and collapse: Secondary | ICD-10-CM

## 2024-07-07 DIAGNOSIS — I270 Primary pulmonary hypertension: Secondary | ICD-10-CM

## 2024-07-07 MED ORDER — QUETIAPINE FUMARATE 300 MG PO TABS: ORAL_TABLET | ORAL | Status: DC

## 2024-07-07 MED ORDER — TOPIRAMATE 100 MG PO TABS: 100.0000 mg | ORAL_TABLET | Freq: Every day | ORAL | Status: DC

## 2024-07-07 MED ORDER — CLONAZEPAM 0.5 MG PO TABS
0.5000 mg | ORAL_TABLET | Freq: Every day | ORAL | Status: DC | PRN
Start: 2024-07-07 — End: 2024-07-11

## 2024-07-07 MED ORDER — PREGABALIN 75 MG PO CAPS: 75.0000 mg | ORAL_CAPSULE | Freq: Every day | ORAL | Status: DC

## 2024-07-07 NOTE — Progress Notes (Unsigned)
 Subjective:  Patient ID: Cindy Hammond, female    DOB: 03-28-1966  Age: 58 y.o. MRN: [id number]  Chief Complaint  Patient presents with  . Hospital follow up    HPI Follow up Hospitalization  Patient was admitted to [facility] on 10/30 and discharged on 11/3. She was treated for kidney stones --- had stents placed.  Telephone follow up was done on 07/06/24 She reports good compliance with treatment. She reports this condition is improved. Pt will be having a urology follow up in a few weeks with Dr Giovanni States she is not having any urine symptoms and no side pain at this time  Of note while patient was in hospital she had 3 syncopal spells.  One was in room with husband on Friday.  One was witnessed by medical personnel on Saturday and she had hypoxic episode during surgery as well     07/06/2024    9:37 AM 06/07/2024    1:16 PM 04/05/2024    2:50 PM 03/09/2024    2:00 PM 02/24/2024    2:56 PM  Depression screen PHQ 2/9  Decreased Interest 0 0 0 0 0  Down, Depressed, Hopeless 1  0 0 0  PHQ - 2 Score 1 0 0 0 0  Altered sleeping  0  0 0  Tired, decreased energy  2  1 1   Change in appetite  0  1 1  Feeling bad or failure about yourself   0  0 0  Trouble concentrating  0  0 0  Moving slowly or fidgety/restless  0     Suicidal thoughts  0  0 0  PHQ-9 Score  2   2  2    Difficult doing work/chores  Not difficult at all        Data saved with a previous flowsheet row definition        08/12/2023    9:24 AM 12/15/2023    8:10 AM 02/16/2024    2:40 PM 04/05/2024    2:50 PM 06/07/2024    1:16 PM  Fall Risk  Falls in the past year? 0 0 0 1 0  Was there an injury with Fall? 0 0 0 0 0  Fall Risk Category Calculator 0 0 0 2 0  Patient at Risk for Falls Due to No Fall Risks No Fall Risks No Fall Risks History of fall(s) No Fall Risks  Fall risk Follow up Falls evaluation completed  Falls evaluation completed Falls evaluation completed Falls evaluation completed      ROS CONSTITUTIONAL: Negative for chills, fatigue, fever, unintentional weight gain and unintentional weight loss.  E/N/T: Negative for ear pain, nasal congestion and sore throat.  CARDIOVASCULAR: Negative for chest pain, dizziness, palpitations and pedal edema.  RESPIRATORY: Negative for recent cough and dyspnea.  GASTROINTESTINAL: Negative for abdominal pain, acid reflux symptoms, constipation, diarrhea, nausea and vomiting.  MSK: Negative for arthralgias and myalgias.  INTEGUMENTARY: Negative for rash.  NEUROLOGICAL: Negative for dizziness and headaches.  PSYCHIATRIC: Negative for sleep disturbance and to question depression screen.  Negative for depression, negative for anhedonia.    Current Outpatient Medications:  .  albuterol  (VENTOLIN  HFA) 108 (90 Base) MCG/ACT inhaler, INHALE 1-2 PUFFS INTO THE LUNGS EVERY 6 HOURS AS NEEDED FOR WHEEZING OR SHORTNESS OF BREATH, Disp: 8.5 g, Rfl: 2 .  amitriptyline  (ELAVIL ) 150 MG tablet, TAKE 1 TABLET BY MOUTH DAILY AT BEDTIME., Disp: 90 tablet, Rfl: 0 .  atenolol  (TENORMIN ) 25 MG tablet, TAKE 1 TABLET  BY MOUTH TWICE(2) DAILY (Patient taking differently: Take 12.5 mg by mouth daily.), Disp: 180 tablet, Rfl: 3 .  atorvastatin  (LIPITOR) 80 MG tablet, Take 1 tablet by mouth ONCE daily., Disp: 90 tablet, Rfl: 1 .  bumetanide  (BUMEX ) 2 MG tablet, Take 2 mg by mouth daily., Disp: , Rfl:  .  cefdinir (OMNICEF) 300 MG capsule, Take 1 capsule (300 mg total) by mouth 2 (two) times daily., Disp: 14 capsule, Rfl: 0 .  EMGALITY 120 MG/ML SOAJ, SMARTSIG:2 Milliliter(s) SUB-Q Once, Disp: , Rfl:  .  KRILL OIL PO, Take 1 capsule by mouth daily., Disp: , Rfl:  .  meloxicam  (MOBIC ) 15 MG tablet, Take 1 tablet by mouth once daily., Disp: 90 tablet, Rfl: 1 .  montelukast (SINGULAIR) 10 MG tablet, Take 10 mg by mouth daily., Disp: , Rfl:  .  nitroGLYCERIN  (NITROSTAT ) 0.4 MG SL tablet, PLACE 1 TABLET UNDER THE TONGUE AS NEEDED FOR CHEST PAIN ( MAY REPEAT EVERY5 MINUTES X  3), Disp: 25 tablet, Rfl: 3 .  omeprazole  (PRILOSEC) 40 MG capsule, Take 1 capsule by mouth once daily. (Patient taking differently: Take 40 mg by mouth daily. Takes as needed), Disp: 90 capsule, Rfl: 1 .  OXYGEN , Inhale 1 L into the lungs at bedtime. (Patient taking differently: Inhale 1.5 L into the lungs at bedtime.), Disp: , Rfl:  .  promethazine (PHENERGAN) 25 MG tablet, Take 25 mg by mouth every 8 (eight) hours as needed., Disp: , Rfl:  .  ranolazine  (RANEXA ) 500 MG 12 hr tablet, Take 1 tablet by mouth twice daily. (Patient taking differently: Take 1 tablet by mouth twice daily.), Disp: 60 tablet, Rfl: 11 .  Ubrogepant  (UBRELVY ) 100 MG TABS, Take 100 mg by mouth 2 (two) times daily as needed (migraine)., Disp: , Rfl:  .  Vitamin D , Ergocalciferol , (DRISDOL ) 1.25 MG (50000 UNIT) CAPS capsule, Take 1 capsule by mouth twice a week as directed, Disp: 10 capsule, Rfl: 1 .  clonazePAM  (KLONOPIN ) 0.5 MG tablet, Take 1 tablet (0.5 mg total) by mouth daily as needed for anxiety., Disp: , Rfl:  .  pregabalin  (LYRICA ) 75 MG capsule, Take 1 capsule (75 mg total) by mouth daily., Disp: , Rfl:  .  QUEtiapine  (SEROQUEL ) 300 MG tablet, 1/2 po qhs, Disp: , Rfl:  .  topiramate  (TOPAMAX ) 100 MG tablet, Take 1 tablet (100 mg total) by mouth daily., Disp: , Rfl:   Past Medical History:  Diagnosis Date  . Abnormal chest x-ray with multiple lung nodules 10/26/2020  . Abnormal laboratory test 02/16/2024  . Acute cystitis without hematuria 02/01/2024  . Acute hemorrhagic cystitis 07/23/2023  . Acute laryngopharyngitis 05/22/2020  . Anxiety   . Asthma 06/20/2015  . Asthma in adult, moderate persistent, with status asthmaticus 10/26/2020  . Benign hypertension 10/26/2019  . Chest pain of uncertain etiology 08/29/2021  . Chronic kidney disease (CKD) 08/13/2023   Dr Camie Moats    . Coronary artery disease 06/20/2015  . Depression   . Dysuria 02/01/2024  . Elevated liver enzymes 02/01/2024  . Elevated  transaminase level 02/01/2024  . Exertional dyspnea 04/25/2024  . Fibromyalgia 09/21/2020  . Gastroesophageal reflux disease without esophagitis 10/26/2019  . GERD (gastroesophageal reflux disease)   . Hyperlipidemia   . Hypertension   . Kyphosis deformity of spine 10/26/2020  . Leukocytes in urine 02/16/2024  . Migraine 01/31/2020  . Mixed hyperlipidemia 10/26/2019  . Morbid obesity (HCC) 08/17/2020  . Multicystic dysplastic kidney (MCDK) 10/26/2020  . Nail disorder 05/14/2023  .  NASH (nonalcoholic steatohepatitis) 10/26/2020  . Need for prophylactic vaccination against Streptococcus pneumoniae (pneumococcus) 02/18/2021  . Need for prophylactic vaccination and inoculation against influenza 05/03/2020  . Need for shingles vaccine 08/06/2022  . Needs flu shot 05/14/2023  . Osteoarthritis (arthritis due to wear and tear of joints) 11/20/2023  . Other fatigue 10/26/2019  . Pain in joint, lower leg   . Pharyngitis 10/26/2020  . Polyuria 04/07/2023  . Precordial pain 08/17/2020  . Prediabetes 01/31/2020  . Shortness of breath 08/17/2020  . Sleep apnea in adult 09/21/2020  . Syncope 04/25/2024  . Thrombocytopenia   . Vaginal bleeding 08/12/2023  . Vitamin D  deficiency 10/26/2019   Objective:  PHYSICAL EXAM:   BP 118/88   Pulse 78   Temp 98.1 F (36.7 C) (Temporal)   LMP  (LMP Unknown)   SpO2 96%  {Vitals History (Optional):23777}  GEN: Well nourished, well developed, in no acute distress  HEENT: normal external ears and nose - normal external auditory canals and TMS - hearing grossly normal - normal nasal mucosa and septum - Lips, Teeth and Gums - normal  Oropharynx - normal mucosa, palate, and posterior pharynx Neck: no JVD or masses - no thyromegaly Cardiac: RRR; no murmurs, rubs, or gallops,no edema - no significant varicosities Respiratory:  normal respiratory rate and pattern with no distress - normal breath sounds with no rales, rhonchi, wheezes or rubs GI: normal  bowel sounds, no masses or tenderness MS: no deformity or atrophy  Skin: warm and dry, no rash  Neuro:  Alert and Oriented x 3, Strength and sensation are intact - CN II-Xii grossly intact Psych: euthymic mood, appropriate affect and demeanor  Assessment & Plan:  *** Resistant hypertension -     CBC with Differential/Platelet -     Comprehensive metabolic panel with GFR -     Phosphorus -     Magnesium  Syncope, unspecified syncope type -     CBC with Differential/Platelet -     Comprehensive metabolic panel with GFR -     Phosphorus -     Magnesium  Abnormal laboratory test -     Phosphorus -     Magnesium  Anxiety -     clonazePAM ; Take 1 tablet (0.5 mg total) by mouth daily as needed for anxiety.  Other orders -     Pregabalin ; Take 1 capsule (75 mg total) by mouth daily. -     QUEtiapine  Fumarate; 1/2 po qhs -     Topiramate ; Take 1 tablet (100 mg total) by mouth daily.     Follow-up: Return in about 4 weeks (around 08/04/2024) for follow-up.  An After Visit Summary was printed and given to the patient.  CAMIE JONELLE NICHOLAUS DEVONNA [facility] [phone]

## 2024-07-07 NOTE — Telephone Encounter (Signed)
 Spoke w patient.  She stated that her atenolol  was decreased to half tablet daily.  She thinks it was when she was in the hospital recently.  She is concerned about her blood pressure, specifically the bottom number which is high but the bottom number is always high.  She states have been running around 112/102. Does not know the heart rate.  While on the phone she took a reading - error occurred the first time.  Rechecked again without changing arms or waiting - 185/117, HR 98.  She saw her PCP today who adv her to stay on the same medications until her cardiology appointment on Monday 07/11/24.  The PCP visit V/S: 118/88, HR 78.  I adv I would send a message to Dr. Sheena and Dr. Liborio and that she should bring her medications and list of further readings including heart rates with her to appointment Monday.  Adv if they have further recommendations prior to Monday, we will let her know.

## 2024-07-10 LAB — CBC WITH DIFFERENTIAL/PLATELET

## 2024-07-10 LAB — COMPREHENSIVE METABOLIC PANEL WITH GFR
ALT: 19 IU/L (ref 0–32)
AST: 25 IU/L (ref 0–40)
Albumin: 3.8 g/dL (ref 3.8–4.9)
Alkaline Phosphatase: 167 IU/L — ABNORMAL HIGH (ref 49–135)
BUN/Creatinine Ratio: 19 (ref 9–23)
BUN: 22 mg/dL (ref 6–24)
Bilirubin Total: 1.3 mg/dL — ABNORMAL HIGH (ref 0.0–1.2)
CO2: 21 mmol/L (ref 20–29)
Calcium: 9.5 mg/dL (ref 8.7–10.2)
Chloride: 102 mmol/L (ref 96–106)
Creatinine, Ser: 1.15 mg/dL — ABNORMAL HIGH (ref 0.57–1.00)
Globulin, Total: 3.3 g/dL (ref 1.5–4.5)
Glucose: 89 mg/dL (ref 70–99)
Potassium: 4.2 mmol/L (ref 3.5–5.2)
Sodium: 142 mmol/L (ref 134–144)
Total Protein: 7.1 g/dL (ref 6.0–8.5)
eGFR: 56 mL/min/1.73 — ABNORMAL LOW (ref >59)

## 2024-07-10 LAB — MAGNESIUM: Magnesium: 1.9 mg/dL (ref 1.6–2.3)

## 2024-07-10 LAB — PHOSPHORUS: Phosphorus: 4.9 mg/dL — ABNORMAL HIGH (ref 3.0–4.3)

## 2024-07-11 ENCOUNTER — Other Ambulatory Visit: Payer: Self-pay | Admitting: Physician Assistant

## 2024-07-11 ENCOUNTER — Ambulatory Visit: Payer: Self-pay | Admitting: Physician Assistant

## 2024-07-11 ENCOUNTER — Encounter: Payer: Self-pay | Admitting: Physician Assistant

## 2024-07-11 ENCOUNTER — Ambulatory Visit

## 2024-07-11 VITALS — BP 118/90 | HR 92 | Ht 60.0 in | Wt 233.6 lb

## 2024-07-11 DIAGNOSIS — I2510 Atherosclerotic heart disease of native coronary artery without angina pectoris: Secondary | ICD-10-CM | POA: Diagnosis not present

## 2024-07-11 DIAGNOSIS — R55 Syncope and collapse: Secondary | ICD-10-CM | POA: Diagnosis not present

## 2024-07-11 DIAGNOSIS — E559 Vitamin D deficiency, unspecified: Secondary | ICD-10-CM | POA: Insufficient documentation

## 2024-07-11 DIAGNOSIS — I272 Pulmonary hypertension, unspecified: Secondary | ICD-10-CM | POA: Diagnosis not present

## 2024-07-11 DIAGNOSIS — I119 Hypertensive heart disease without heart failure: Secondary | ICD-10-CM | POA: Insufficient documentation

## 2024-07-11 DIAGNOSIS — R899 Unspecified abnormal finding in specimens from other organs, systems and tissues: Secondary | ICD-10-CM

## 2024-07-11 DIAGNOSIS — F419 Anxiety disorder, unspecified: Secondary | ICD-10-CM

## 2024-07-11 MED ORDER — AMITRIPTYLINE HCL 150 MG PO TABS: 150.0000 mg | ORAL_TABLET | Freq: Every day | ORAL | 2 refills | Status: AC

## 2024-07-11 MED ORDER — CLONAZEPAM 0.5 MG PO TABS: 0.5000 mg | ORAL_TABLET | Freq: Every day | ORAL | 1 refills | Status: DC

## 2024-07-11 MED ORDER — TOPIRAMATE 100 MG PO TABS: 100.0000 mg | ORAL_TABLET | Freq: Every day | ORAL | 2 refills | Status: AC

## 2024-07-11 MED ORDER — AMLODIPINE BESYLATE 2.5 MG PO TABS: 2.5000 mg | ORAL_TABLET | Freq: Every day | ORAL | 3 refills | Status: DC

## 2024-07-11 MED ORDER — QUETIAPINE FUMARATE ER 150 MG PO TB24: 150.0000 mg | ORAL_TABLET | Freq: Every day | ORAL | 2 refills | Status: AC

## 2024-07-11 NOTE — Assessment & Plan Note (Signed)
 Advised to monitor her calorie intake and follow-up calorie deficient diet. Further management as per primary team.

## 2024-07-11 NOTE — Assessment & Plan Note (Addendum)
 Recurrent syncopal episodes in the past, currently has a loop recorder in place no significant abnormalities. Recently had another similar episode after ureteroscopy and ureteral stent placement during inpatient stay at [facility] not associated with any abnormalities on loop recorder check and telemetry during inpatient stay.   Continue with loop recorder monitoring as per device clinic.

## 2024-07-11 NOTE — Progress Notes (Signed)
 Cardiology Consultation:    Date:  07/11/2024   ID:  Cindy Hammond, DOB Jun 24, 1966, MRN [id number]  PCP:  Nicholaus Credit, PA-C  Cardiologist:  Alean SAUNDERS Shalaine Payson, MD   Referring MD: Nicholaus Credit, PA-C   No chief complaint on file.    ASSESSMENT AND PLAN:   Cindy Hammond 58 year old woman history of minimal nonobstructive coronary artery disease on cardiac cath 01/12/2024 [mid LAD 20% stenosis], pulmonary hypertension right heart cath hemodynamics 01/12/2024 [cardiac index 2.5, PCWP 5, LVEDP 12 mmHg, mean PA 44 mmHg 71/29], hypertension, hyperlipidemia, morbid obesity, recurrent syncopal episodes [unremarkable 30-day event monitor June 2025; now s/p loop recorder implanted 05/16/2024], sleep apnea, CKD stage III, renal stones [s/p ureteric stent normal 2025], lung nodules, fibromyalgia, smokes tobacco [recently quit 07/01/2024].   Problem List Items Addressed This Visit     Vitamin D  deficiency   Hypertension with heart disease   Elevated blood pressures at times at home's with systolic up to 200s and diastolic up to 100s. Today in the office they appear to be well-controlled. Fluid status appears to be euvolemic.  Continue with low-dose atenolol  12.5 mg once daily. Will add low-dose amlodipine 2.5 mg once daily given her severe pulmonary hypertension. Recommend continue with low-salt diet. Continue with Bumex  2 mg on an as-needed basis for any significant weight gain 2 to 3 pounds in a day or 5 pounds in a week.       Relevant Medications   atenolol  (TENORMIN ) 25 MG tablet   amLODipine (NORVASC) 2.5 MG tablet   Morbid obesity (HCC)   Advised to monitor her calorie intake and follow-up calorie deficient diet. Further management as per primary team.       Coronary artery disease   Mild nonobstructive CAD on cardiac cath 01/12/2024.  No active anginal symptoms. She is on a very low atypical dose of ranolazine  500 mg at bedtime. No obstructive CAD since we will discontinue  ranolazine   Continue low-dose aspirin  81 mg once daily if no contraindication.  Previously was on statins. Recently with mildly elevated alkaline phosphatase and total bilirubin appears to be on hold. Statins can be reconsidered if LFTs are stable without further deterioration.       Relevant Medications   atenolol  (TENORMIN ) 25 MG tablet   amLODipine (NORVASC) 2.5 MG tablet   Syncope   Recurrent syncopal episodes in the past, currently has a loop recorder in place no significant abnormalities. Recently had another similar episode after ureteroscopy and ureteral stent placement during inpatient stay at [facility] not associated with any abnormalities on loop recorder check and telemetry during inpatient stay.   Continue with loop recorder monitoring as per device clinic.      Pulmonary hypertension, unspecified (HCC) - Primary   right heart cath hemodynamics 01/12/2024 [cardiac index 2.5, PCWP 5, LVEDP 12 mmHg, mean PA 44 mmHg 71/29].  Echocardiogram recently at [facility] 07/02/2024 during inpatient stay reported LVEF 60 to 65%, dilated RV with moderately reduced function with fluid overload pattern, dilated right atrium, moderate TR, RVSP 80 mmHg.  In comparison prior TTE was as below. Echocardiogram 12/05/2022 reported LVEF 60 to 65%, grade 1 diastolic dysfunction, mildly dilated RV with normal function, no significant valve abnormality.  Has pending follow-up with with Dr. Hope at [facility] pulmonology.  From cardiac standpoint she might have mild diastolic dysfunction of the LV however recent right heart cath LVEDP was normal.  Recommend further evaluation for pulmonary causes of pulmonary hypertension given her obesity, longstanding history  of smoking, lung nodules.  Recommended she continue with low-salt diet less than 2 g/day. Fluid restriction to around 2 L/day.  Continue weight monitoring at home. Will add low-dose amlodipine 2.5 mg once daily given her  fluctuating blood pressures.        Relevant Medications   atenolol  (TENORMIN ) 25 MG tablet   amLODipine (NORVASC) 2.5 MG tablet      History of Present Illness:    Cindy Hammond is a 58 y.o. female who is being seen today for follow-up visit. PCP is Nicholaus Credit, PA-C. Was following up with Dr. Sheena, last visit 01/05/2024 Last visit with cardiology office was electrophysiology clinic with Dr. Kennyth 05/16/2024 for evaluation of syncope and had a loop recorder implanted at that visit. Mentions had visit with pulmonologist Dr. Mardee, locally in [place] and was told no evidence of sleep apnea and was prescribed nocturnal O2. Has pending visit with Dr. Hope in pulmonology.  Here for the visit today by herself.  Stays with her husband at home.  Able to manage her own medications and day-to-day activities.  Has history of minimal nonobstructive coronary artery disease on cardiac cath 01/12/2024 [mid LAD 20% stenosis], pulmonary hypertension right heart cath hemodynamics 01/12/2024 [cardiac index 2.5, PCWP 5, LVEDP 12 mmHg, mean PA 44 mmHg 71/29], hypertension, hyperlipidemia, morbid obesity, recurrent syncopal episodes [unremarkable 30-day event monitor June 2025; now s/p loop recorder implanted 05/16/2024], sleep apnea, CKD stage III, renal stones [s/p ureteric stent normal 2025], lung nodules, fibromyalgia, smokes tobacco [recently quit 07/01/2024].  Echocardiogram recently at [facility] 07/02/2024 during inpatient stay reported LVEF 60 to 65%, dilated RV with moderately reduced function with fluid overload pattern, dilated right atrium, moderate TR, RVSP 80 mmHg.  In comparison prior TTE was as below. Echocardiogram 12/05/2022 reported LVEF 60 to 65%, grade 1 diastolic dysfunction, mildly dilated RV with normal function, no significant valve abnormality.  Loop recorder check 06/16/2024 normal device function, 1 symptomatic event correlated with sinus rhythm sinus tachycardia.  She was  recently admitted and discharged from [facility] between 07/01/2024 through 07/05/2024 in the setting of obstructive ureteral stone requiring ureteroscopy laser lithotripsy and stent placement.  Inpatient stay was noted to be complicated with cyanosis, hypotension and desaturation requiring BiPAP and ICU stay after the urological procedure and was evaluated by cardiology inpatient they noted no significant abnormalities in terms of the rhythm that explains her symptoms.  Echocardiogram as reviewed above with RV dilatation with severe RVSP elevation.  Here today for follow-up visit mentions she has been breathing better and no significant urological symptoms. Pending follow-up with urology.  Mentions functional status is overall limited to day-to-day activities.  From blood pressure standpoint mentions her blood pressures have been fluctuating up and down with systolic ranging up to 120s and back to normal and diastolic ranging from 70s to 100s. Mentions she has been consistently taking her blood pressure medications Continues to take atenolol  12.5 mg once daily.  Denies any chest pain suggestive of angina. Is chronically short of breath.  No significant change. Denies any further syncopal episodes.  Has bilateral lower extremity edema intermittently and uses Bumex  2 mg on an as needed basis typically 2-3 times a week. Aware of low-salt recommendations but has not been very diligent.  Uses ranolazine  1 time a day at bedtime.  Denies any blood in urine or stools.  Mentions she quit smoking after this recent hospital stay from 07/01/2024.   Blood work from 07/02/2024 notes BNP 539. High-sensitivity  troponin 23, 26  Blood work from 07/07/2024 BUN 22, creatinine 1.15, eGFR 56 Alkaline phosphatase mildly elevated 167, total bilirubin mildly elevated 1.3. Normal transaminases Magnesium 1.9.  Phosphorus elevated 4.9.  Past Medical History:  Diagnosis Date   Abnormal chest x-ray  with multiple lung nodules 10/26/2020   Abnormal laboratory test 02/16/2024   Acute cystitis without hematuria 02/01/2024   Acute hemorrhagic cystitis 07/23/2023   Acute laryngopharyngitis 05/22/2020   Anxiety    Asthma 06/20/2015   Asthma in adult, moderate persistent, with status asthmaticus 10/26/2020   Benign hypertension 10/26/2019   Chest pain of uncertain etiology 08/29/2021   Chronic kidney disease (CKD) 08/13/2023   Dr Camie Moats     Coronary artery disease 06/20/2015   Depression    Dysuria 02/01/2024   Elevated liver enzymes 02/01/2024   Elevated transaminase level 02/01/2024   Exertional dyspnea 04/25/2024   Fibromyalgia 09/21/2020   Gastroesophageal reflux disease without esophagitis 10/26/2019   GERD (gastroesophageal reflux disease)    Hyperlipidemia    Hypertension    Kyphosis deformity of spine 10/26/2020   Leukocytes in urine 02/16/2024   Migraine 01/31/2020   Mixed hyperlipidemia 10/26/2019   Morbid obesity (HCC) 08/17/2020   Multicystic dysplastic kidney (MCDK) 10/26/2020   Nail disorder 05/14/2023   NASH (nonalcoholic steatohepatitis) 10/26/2020   Need for prophylactic vaccination against Streptococcus pneumoniae (pneumococcus) 02/18/2021   Need for prophylactic vaccination and inoculation against influenza 05/03/2020   Need for shingles vaccine 08/06/2022   Needs flu shot 05/14/2023   Osteoarthritis (arthritis due to wear and tear of joints) 11/20/2023   Other fatigue 10/26/2019   Pain in joint, lower leg    Pharyngitis 10/26/2020   Polyuria 04/07/2023   Precordial pain 08/17/2020   Prediabetes 01/31/2020   Shortness of breath 08/17/2020   Sleep apnea in adult 09/21/2020   Syncope 04/25/2024   Thrombocytopenia    Vaginal bleeding 08/12/2023   Vitamin D  deficiency 10/26/2019    Past Surgical History:  Procedure Laterality Date   RIGHT/LEFT HEART CATH AND CORONARY ANGIOGRAPHY N/A 01/12/2024   Procedure: RIGHT/LEFT HEART CATH AND CORONARY  ANGIOGRAPHY;  Surgeon: Verlin Lonni BIRCH, MD;  Location: [facility] INVASIVE CV LAB;  Service: Cardiovascular;  Laterality: N/A;   TUBAL LIGATION      Current Medications: Current Meds  Medication Sig   albuterol  (VENTOLIN  HFA) 108 (90 Base) MCG/ACT inhaler INHALE 1-2 PUFFS INTO THE LUNGS EVERY 6 HOURS AS NEEDED FOR WHEEZING OR SHORTNESS OF BREATH   amitriptyline  (ELAVIL ) 150 MG tablet Take 1 tablet (150 mg total) by mouth at bedtime.   amLODipine (NORVASC) 2.5 MG tablet Take 1 tablet (2.5 mg total) by mouth daily.   atenolol  (TENORMIN ) 25 MG tablet Take 12.5 mg by mouth daily.   atorvastatin  (LIPITOR) 80 MG tablet Take 1 tablet by mouth ONCE daily.   bumetanide  (BUMEX ) 2 MG tablet Take 2 mg by mouth as needed (swelling).   clonazePAM  (KLONOPIN ) 0.5 MG tablet Take 1 tablet (0.5 mg total) by mouth at bedtime.   EMGALITY 120 MG/ML SOAJ Inject 120 mg into the skin every 30 (thirty) days.   KRILL OIL PO Take 1 capsule by mouth daily.   meloxicam  (MOBIC ) 15 MG tablet Take 1 tablet by mouth once daily.   nitroGLYCERIN  (NITROSTAT ) 0.4 MG SL tablet PLACE 1 TABLET UNDER THE TONGUE AS NEEDED FOR CHEST PAIN ( MAY REPEAT EVERY5 MINUTES X 3)   OXYGEN  Inhale 1 L into the lungs at bedtime.   pregabalin  (LYRICA ) 75 MG  capsule Take 1 capsule (75 mg total) by mouth daily.   promethazine (PHENERGAN) 25 MG tablet Take 25 mg by mouth every 8 (eight) hours as needed.   QUEtiapine  Fumarate (SEROQUEL  XR) 150 MG 24 hr tablet Take 1 tablet (150 mg total) by mouth at bedtime.   topiramate  (TOPAMAX ) 100 MG tablet Take 1 tablet (100 mg total) by mouth daily.   Ubrogepant  (UBRELVY ) 100 MG TABS Take 100 mg by mouth 2 (two) times daily as needed (migraine).   Vitamin D , Ergocalciferol , (DRISDOL ) 1.25 MG (50000 UNIT) CAPS capsule Take 1 capsule by mouth twice a week as directed (Patient taking differently: Take 2 capsules by mouth once a week. Take 1 capsule by mouth twice a week as directed)   [DISCONTINUED] ranolazine   (RANEXA ) 500 MG 12 hr tablet Take 500 mg by mouth daily.     Allergies:   Sumatriptan and Sulfa antibiotics   Social History   Socioeconomic History   Marital status: Married    Spouse name: Not on file   Number of children: 3   Years of education: Not on file   Highest education level: GED or equivalent  Occupational History   Occupation: Unemployed  Tobacco Use   Smoking status: Some Days    Current packs/day: 0.50    Average packs/day: 0.5 packs/day for 35.9 years (17.9 ttl pk-yrs)    Types: Cigarettes    Start date: 13   Smokeless tobacco: Never  Vaping Use   Vaping status: Never Used  Substance and Sexual Activity   Alcohol use: No    Alcohol/week: 0.0 standard drinks of alcohol   Drug use: No   Sexual activity: Yes    Partners: Male  Other Topics Concern   Not on file  Social History Narrative   Not on file   Social Drivers of Health   Financial Resource Strain: Medium Risk (02/01/2024)   Overall Financial Resource Strain (CARDIA)    Difficulty of Paying Living Expenses: Somewhat hard  Food Insecurity: High Risk (07/08/2024)   Received from [facility]   Hunger Vital Sign    Within the past 12 months, you worried that your food would run out before you got money to buy more: Often true    Within the past 12 months, the food you bought just didn't last and you didn't have money to get more. : Often true  Transportation Needs: No Transportation Needs (07/06/2024)   PRAPARE - Administrator, Civil Service (Medical): No    Lack of Transportation (Non-Medical): No  Physical Activity: Insufficiently Active (02/01/2024)   Exercise Vital Sign    Days of Exercise per Week: 1 day    Minutes of Exercise per Session: 10 min  Stress: No Stress Concern Present (02/01/2024)   [employer] of Occupational Health - Occupational Stress Questionnaire    Feeling of Stress : Not at all  Social Connections: Socially Integrated (02/01/2024)   Social Connection and  Isolation Panel    Frequency of Communication with Friends and Family: More than three times a week    Frequency of Social Gatherings with Friends and Family: Twice a week    Attends Religious Services: 1 to 4 times per year    Active Member of [employer] or Organizations: Yes    Attends Banker Meetings: 1 to 4 times per year    Marital Status: Married     Family History: The patient's family history includes Hyperlipidemia in her father and mother; Hypertension  in her father and mother. ROS:   Please see the history of present illness.    All 14 point review of systems negative except as described per history of present illness.  EKGs/Labs/Other Studies Reviewed:    The following studies were reviewed today:   EKG:       Recent Labs: 04/05/2024: TSH 1.690 07/07/2024: ALT 19; BUN 22; Creatinine, Ser 1.15; Hemoglobin CANCELED; Magnesium 1.9; Platelets CANCELED; Potassium 4.2; Sodium 142  Recent Lipid Panel    Component Value Date/Time   CHOL 170 12/15/2023 0841   TRIG 157 (H) 12/15/2023 0841   HDL 46 12/15/2023 0841   CHOLHDL 3.7 12/15/2023 0841   LDLCALC 97 12/15/2023 0841    Physical Exam:    VS:  BP (!) 118/90   Pulse 92   Ht 5' (1.524 m)   Wt 233 lb 9.6 oz (106 kg)   LMP  (LMP Unknown)   SpO2 94%   BMI 45.62 kg/m     Wt Readings from Last 3 Encounters:  07/11/24 233 lb 9.6 oz (106 kg)  07/07/24 233 lb 9.6 oz (106 kg)  06/28/24 244 lb (110.7 kg)     GENERAL:  Well nourished, well developed .  Obese, hunched back.  No tachypnea or distress. NECK: No JVD; No carotid bruits CARDIAC: RRR, S1 and S2 present, no murmurs, no rubs, no gallops CHEST:  Clear to auscultation without rales, wheezing or rhonchi  Extremities: Trace bilateral pitting ankle edema. Pulses bilaterally symmetric with radial 2+ and dorsalis pedis 2+ NEUROLOGIC:  Alert and oriented x 3  Medication Adjustments/Labs and Tests Ordered: Current medicines are reviewed at length with the  patient today.  Concerns regarding medicines are outlined above.  No orders of the defined types were placed in this encounter.  Meds ordered this encounter  Medications   amLODipine (NORVASC) 2.5 MG tablet    Sig: Take 1 tablet (2.5 mg total) by mouth daily.    Dispense:  90 tablet    Refill:  3    Signed, Hiroko Tregre reddy Ajanay Farve, MD, MPH, [facility]. 07/11/2024 1:30 PM    [facility]

## 2024-07-11 NOTE — Patient Instructions (Addendum)
 Medication Instructions:    STOP: Ranexa    START: Amlodipine 2.5mg  1 tablet daily   Lab Work: None Ordered If you have labs (blood work) drawn today and your tests are completely normal, you will receive your results only by: MyChart Message (if you have MyChart) OR A paper copy in the mail If you have any lab test that is abnormal or we need to change your treatment, we will call you to review the results.   Testing/Procedures: None Ordered   Follow-Up: At [facility], you and your health needs are our priority.  As part of our continuing mission to provide you with exceptional heart care, we have created designated Provider Care Teams.  These Care Teams include your primary Cardiologist (physician) and Advanced Practice Providers (APPs -  Physician Assistants and Nurse Practitioners) who all work together to provide you with the care you need, when you need it.  We recommend signing up for the patient portal called MyChart.  Sign up information is provided on this After Visit Summary.  MyChart is used to connect with patients for Virtual Visits (Telemedicine).  Patients are able to view lab/test results, encounter notes, upcoming appointments, etc.  Non-urgent messages can be sent to your provider as well.   To learn more about what you can do with MyChart, go to [email or web address].    Your next appointment:   3 month(s)  The format for your next appointment:   In Person  Provider:   Alean kobus, MD   Other Instructions NA

## 2024-07-11 NOTE — Assessment & Plan Note (Signed)
 Elevated blood pressures at times at home's with systolic up to 200s and diastolic up to 100s. Today in the office they appear to be well-controlled. Fluid status appears to be euvolemic.  Continue with low-dose atenolol  12.5 mg once daily. Will add low-dose amlodipine 2.5 mg once daily given her severe pulmonary hypertension. Recommend continue with low-salt diet. Continue with Bumex  2 mg on an as-needed basis for any significant weight gain 2 to 3 pounds in a day or 5 pounds in a week.

## 2024-07-11 NOTE — Assessment & Plan Note (Addendum)
 Mild nonobstructive CAD on cardiac cath 01/12/2024.  No active anginal symptoms. She is on a very low atypical dose of ranolazine  500 mg at bedtime. No obstructive CAD since we will discontinue ranolazine   Continue low-dose aspirin  81 mg once daily if no contraindication.  Previously was on statins. Recently with mildly elevated alkaline phosphatase and total bilirubin appears to be on hold. Statins can be reconsidered if LFTs are stable without further deterioration.

## 2024-07-11 NOTE — Assessment & Plan Note (Signed)
 right heart cath hemodynamics 01/12/2024 [cardiac index 2.5, PCWP 5, LVEDP 12 mmHg, mean PA 44 mmHg 71/29].  Echocardiogram recently at [facility] 07/02/2024 during inpatient stay reported LVEF 60 to 65%, dilated RV with moderately reduced function with fluid overload pattern, dilated right atrium, moderate TR, RVSP 80 mmHg.  In comparison prior TTE was as below. Echocardiogram 12/05/2022 reported LVEF 60 to 65%, grade 1 diastolic dysfunction, mildly dilated RV with normal function, no significant valve abnormality.  Has pending follow-up with with Dr. Hope at [facility] pulmonology.  From cardiac standpoint she might have mild diastolic dysfunction of the LV however recent right heart cath LVEDP was normal.  Recommend further evaluation for pulmonary causes of pulmonary hypertension given her obesity, longstanding history of smoking, lung nodules.  Recommended she continue with low-salt diet less than 2 g/day. Fluid restriction to around 2 L/day.  Continue weight monitoring at home. Will add low-dose amlodipine 2.5 mg once daily given her fluctuating blood pressures.

## 2024-07-12 ENCOUNTER — Ambulatory Visit: Admitting: Physician Assistant

## 2024-07-12 ENCOUNTER — Inpatient Hospital Stay: Admitting: Physician Assistant

## 2024-07-12 DIAGNOSIS — N202 Calculus of kidney with calculus of ureter: Secondary | ICD-10-CM | POA: Diagnosis not present

## 2024-07-12 DIAGNOSIS — J4540 Moderate persistent asthma, uncomplicated: Secondary | ICD-10-CM | POA: Diagnosis not present

## 2024-07-12 DIAGNOSIS — T191XXA Foreign body in bladder, initial encounter: Secondary | ICD-10-CM | POA: Diagnosis not present

## 2024-07-13 ENCOUNTER — Telehealth: Payer: Self-pay

## 2024-07-13 NOTE — Telephone Encounter (Signed)
 PA submitted and approved via covermymeds for Seroquel .

## 2024-07-14 ENCOUNTER — Ambulatory Visit: Admitting: Primary Care

## 2024-07-14 ENCOUNTER — Encounter: Payer: Self-pay | Admitting: Primary Care

## 2024-07-14 VITALS — BP 138/84 | HR 76 | Ht 60.0 in | Wt 234.4 lb

## 2024-07-14 DIAGNOSIS — I272 Pulmonary hypertension, unspecified: Secondary | ICD-10-CM | POA: Diagnosis not present

## 2024-07-14 DIAGNOSIS — R0602 Shortness of breath: Secondary | ICD-10-CM | POA: Diagnosis not present

## 2024-07-14 DIAGNOSIS — G4733 Obstructive sleep apnea (adult) (pediatric): Secondary | ICD-10-CM

## 2024-07-14 DIAGNOSIS — R0609 Other forms of dyspnea: Secondary | ICD-10-CM

## 2024-07-14 DIAGNOSIS — N202 Calculus of kidney with calculus of ureter: Secondary | ICD-10-CM | POA: Diagnosis not present

## 2024-07-14 DIAGNOSIS — R55 Syncope and collapse: Secondary | ICD-10-CM | POA: Diagnosis not present

## 2024-07-14 DIAGNOSIS — R062 Wheezing: Secondary | ICD-10-CM

## 2024-07-14 DIAGNOSIS — R339 Retention of urine, unspecified: Secondary | ICD-10-CM | POA: Diagnosis not present

## 2024-07-14 DIAGNOSIS — G4730 Sleep apnea, unspecified: Secondary | ICD-10-CM

## 2024-07-14 DIAGNOSIS — F17211 Nicotine dependence, cigarettes, in remission: Secondary | ICD-10-CM

## 2024-07-14 NOTE — Patient Instructions (Addendum)
   Please complete pulmonary function testing Please complete split night sleep study Please continue to wear 1.5L oxygen  at bedtime  Continue Stiolto two puffs daily, use albuterol  sparingly  Follow-up in December for breathing test and apt afterward with Dr. Meade  DME order renew oxygen  supplies with [employer]  Recommended she continue with low-salt diet less than 2 g/day. Fluid restriction to around 2 L/day  __________________________________________________________________________________________    VISIT SUMMARY: Today, you were seen for a sleep consultation due to your ongoing issues with lightheadedness, shortness of breath, and palpitations. We reviewed your history of sleep apnea, pulmonary hypertension, and recent hospitalizations. Your current symptoms and recent changes in your health were discussed in detail.  YOUR PLAN: -OBSTRUCTIVE SLEEP APNEA WITH NOCTURNAL HYPOXIA: Obstructive sleep apnea is a condition where your airway becomes blocked during sleep, causing breathing pauses and low oxygen  levels. You will continue using 1.5 liters of oxygen  at night, and a split night sleep study has been ordered to determine if you need to restart CPAP therapy.  -PULMONARY HYPERTENSION: Pulmonary hypertension is high blood pressure in the arteries of your lungs, which can strain your heart. We will continue your current management plan and monitor your symptoms closely. A split night sleep study has been ordered to check for sleep apnea, which can worsen this condition.  -RECURRENT SYNCOPE AND NEAR-SYNCOPE WITH LIGHTHEADEDNESS AND DIZZINESS: Syncope refers to fainting or passing out, and near-syncope is feeling like you might faint. Your episodes are likely related to your medications. We will continue with your current medication regimen and monitor your symptoms.  -SHORTNESS OF BREATH WITH WHEEZING AND CHEST TIGHTNESS: You have been experiencing shortness of breath, wheezing, and chest  tightness, especially when active. You will resume using the Stiolto inhaler with two puffs every morning. Please avoid using inhalers on the day of your pulmonary function test scheduled for December 17th.  -HISTORY OF SMOKING: You have a long history of smoking but recently quit three weeks ago. This is a positive step for your overall health, especially considering your lung and heart conditions.  INSTRUCTIONS: Please continue using 1.5 liters of oxygen  at night and resume the Stiolto inhaler with two puffs every morning. Avoid using inhalers on the day of your pulmonary function test on December 17th. Attend the split night sleep study as scheduled. Continue monitoring your symptoms and follow your current medication regimen. If you experience any new or worsening symptoms, please contact our office.

## 2024-07-14 NOTE — Progress Notes (Signed)
 @Patient  ID: Cindy Hammond, female    DOB: Apr 18, 1966, 58 y.o.   MRN: [id number]  No chief complaint on file.   Referring provider: Nicholaus Credit, PA-C  HPI: 58 year old female, former smoker. PMH significant for HTN, pulmonary hypertension, CAD, asthma, sleep apnea, NASH, GERD, CKD, anxiety/depression, multiple dysplastic kidney, hyperlipidemia, prediabetes, obesity.   Previous [facility] pulmonary encounter: 04/22/24- Consult, Dr. Meade  History of Present Illness Cindy Hammond is a 58 year old female who presents with lightheadedness and shortness of breath.  For the past two and a half to three months, she has experienced episodes of lightheadedness and shortness of breath, particularly when walking. These episodes are accompanied by a sensation of her heart speeding up or slowing down, and she feels as though she might pass out. She describes her head as feeling 'lightheaded' and 'scattered,' and she loses her hearing during these episodes. She often needs to sit down to catch her breath.  The symptoms began suddenly while walking at [facility], where she felt weak and subsequently fell to the ground. Her legs feel weak during these episodes, and she has been close to syncope several times. She has noted that her blood pressure seems low during these episodes, with readings around 60/80 or 100/60-70, and her heart rate is usually in the 70s to 80s.  She experiences wheezing and chest tightness during these episodes, which can occur even when she is sitting and resting. Resting seems to help alleviate the symptoms. No frequent pneumonia or bronchitis and no breathing issues as a child.  She has a history of sleep apnea and previously used a CPAP machine, which was recalled. A subsequent sleep study indicated she needed oxygen  at night, and she currently uses one liter of oxygen  at night, as two liters makes her feel unwell.  She reports smoking every now and then, with a pack lasting about  three days. She started smoking at 4, quit in 2000, and resumed in December 1999 due to stress while caring for her parents.  She uses an albuterol  inhaler four to five times a day, which helps her breathing. She does not use any other inhalers regularly, although Symbicort is on her medication list. She also takes montelukast for allergies.  Assessment & Plan Dyspnea and exertional lightheadedness Lightheadedness and dyspnea during exertion with low blood pressure and normal cardiac evaluation. Differential includes pulmonary causes and excessive albuterol  use. Orthostatic hypotension - Order pulmonary function testing to assess for smoking-related lung disease. - Prescribe Stiolto inhaler, two puffs every morning. - Instruct to reduce albuterol  use to avoid exacerbating symptoms. - Educated on proper Stiolto inhaler use.  Wheezing and chest tightness Wheezing and chest tightness associated with dyspnea and lightheadedness. Excessive albuterol  use may contribute to symptoms. - Prescribe Stiolto inhaler to reduce wheezing and chest tightness. - Instruct to reduce albuterol  use.  Tobacco use Smoking since age 73 with current use of one pack every three days. Smoking cessation is crucial to reduce lung disease risk.  07/01/24 Hospitalization [facility] Briefly, Cindy Hammond is a 58 y.o. with hypertension, CAD, GERD, hyperlipidemia, multiple lung nodules, anxiety, asthma, fibromyalgia, migraine, obesity, thrombocytopenia, CKD stage III who presented to the ED on 07/01/2024 due to UTI symptoms found to have left UPJ/proximal ureteral stone measuring up to 2.5 mm with right hydronephrosis. Patient was taken to the OR on 07/02/2024 for lithotripsy. Unfortunately, operative course was complicated by cyanosis, hypotension, and a desaturation event with obtundation requiring initiation of  BiPAP and transferred to the ICU on 07/02/2024. The patient was able to be transitioned to nasal cannula  overnight and was noted to be doing well on the morning of 07/03/2024, satting well on 4 L nasal cannula.  At time of transfer, patient is noted to be in no acute distress. She notes that following her surgery/procedure with urology, she had some confusion and is unclear as to the events that transpired. She reports a prior history of difficulty following surgery and waking up from anesthesia. She denies any pain or acute symptoms at time of transfer. She is alert and oriented x 4 and is able to answer questions appropriately. She has satting well on 4 L nasal cannula with normal work of breathing. She does note that a CPAP was previously prescribed to her by her outpatient pulmonologist who then recommended she transition to only as needed oxygen  use at night following a repeat sleep study according to her. Otherwise, she has no acute concerns or complaints at time of transfer.   #AHRF, improving  #OSA  - Favored to be secondary to anesthesia in the setting of known lung disease; however cannot exclude aspiration event. - Obtain bedside CXR, consider increasing Bumex  dose pending result  - Prescribed outpatient however patient has not been wearing  - Wean oxygen  as tolerated to SpO2 goal of 90-92%, baseline O2 req is 1.5 L as needed  - Holding home Elavil  and clonazepam  given mental status, can likely be restarted on 11/3 given improvement in mentation   #Complicated UTI 2/2 obstructive nephrolithiasis  - S/p cystoscopy and lithotripsy with urology on 11/1. Urology following  - Continue IV ceftriaxone  - Follow urine culture - Continue Flomax    #C/f syncope #Severe pHTN  #Moderate TR  - Episode of dizziness, lightheadedness, and brief LOC on 10/31 and 11/1 - Neurology consulted who recommended TTE, loop recorder, and cardiology consult  - TTE from 11/1 with normal LVEF, severe pulmonary HTN, moderate TR, and moderate dilation of RV - Continue home BB - PT/OT eval and treat  - Telemetry  monitoring   Modified Medications   Sig Disp Refill Start End  clonazePAM  0.5 mg tablet Commonly known as: KlonoPIN  What changed: See the new instructions. Take 1 tablet (0.5 mg total) by mouth daily as needed for anxiety. 0  pregabalin  75 mg capsule Commonly known as: LYRICA  What changed: when to take this Take 1 capsule (75 mg total) by mouth daily. 0  QUEtiapine  300 mg tablet Commonly known as: SEROquel  What changed: how much to take Take 0.5 tablets (150 mg total) by mouth nightly. 0  ranolazine  500 mg 12 hr tablet Commonly known as: RANEXA  What changed: when to take this Take 1 tablet (500 mg total) by mouth 2 (two) times a day. 0  topiramate  100 mg tablet Commonly known as: TOPAMAX  What changed: when to take this Take 1 tablet (100 mg total) by mouth daily. Patient takes 1 tablet (100mg ) twice a day 0      11/13/2025[facility] f[facility]u, sleep apnea Discussed the use of AI scribe software for clinical note transcription with the patient, who gave verbal consent to proceed.  History of Present Illness Cindy Hammond is a 58 year old female with sleep apnea and pulmonary hypertension who presents for a sleep consult.  She has been experiencing episodes of lightheadedness and shortness of breath over the past two to three months, particularly when walking. These episodes are accompanied by palpitations, a sensation of near syncope,  wheezing, and chest tightness. Resting alleviates her symptoms.  She has a history of sleep apnea and previously used a CPAP machine, which was recalled. A subsequent sleep study indicated she needed oxygen  at night, and she currently uses 1.5 liters of oxygen . During a recent hospital admission for UTI symptoms, she was found to have a left UPJ/proximal ureteral stone with right hydronephrosis. She experienced cyanosis, hypotension, and desaturation requiring intubation and BiPAP, followed by ICU transfer. She was transitioned to a nasal cannula  overnight and now uses 1.5 liters of oxygen  at night.  She has a history of smoking, starting at age 89, quitting in 2000, resuming in December 2019 due to stress while caring for her parents, and quitting again three weeks ago after her recent hospital admission. She uses albuterol  four to five times a day, which helps her breathing, and was prescribed Stiolto two puffs every morning. She does not use any other inhalers regularly, although Symbicort is on her medication list.  She has been experiencing recurrent syncopal episodes, with a loop recorder in place showing no significant abnormalities. She was recently seen by cardiology and was put on a new blood pressure medication, amlodipine 2.5 mg once a day, in addition to atenolol  12.5 mg. She follows a low salt diet and takes Bumex  2 mg as needed for significant weight gain. Her blood pressure at home has been elevated, with systolic readings up to 200s and diastolic up to 100s.  A TTE was done showing severe pulmonary hypertension, reduced right ventricular systolic function, and moderate dilation of the right ventricle. She has mild non-obstructive coronary artery disease as per a heart catheterization done in May.  Echocardiogram recently at [facility] 07/02/2024 during inpatient stay reported LVEF 60 to 65%, dilated RV with moderately reduced function with fluid overload pattern, dilated right atrium, moderate TR, RVSP 80 mmHg.  In comparison prior TTE was as below.   Allergies  Allergen Reactions   Sumatriptan Hives and Rash   Sulfa Antibiotics     Immunization History  Administered Date(s) Administered   Fluad Trivalent(High Dose 65+) 05/14/2023   Fluzone Influenza virus vaccine,trivalent (IIV3), split virus 08/23/2012, 06/30/2013   Hep B, Unspecified 06/30/2000   Influenza Inj Mdck Quad Pf 06/16/2016, 06/28/2018, 05/03/2020, 05/22/2021, 06/02/2022   Influenza,trivalent, recombinat, inj, PF 05/05/2024    Influenza-Unspecified 07/25/2019   PNEUMOCOCCAL CONJUGATE-20 06/02/2022   Pneumococcal Polysaccharide-23 05/09/2020, 02/18/2021   Tdap 08/14/2020   Zoster Recombinant(Shingrix ) 08/06/2022    Past Medical History:  Diagnosis Date   Abnormal chest x-ray with multiple lung nodules 10/26/2020   Abnormal laboratory test 02/16/2024   Acute cystitis without hematuria 02/01/2024   Acute hemorrhagic cystitis 07/23/2023   Acute laryngopharyngitis 05/22/2020   Anxiety    Asthma 06/20/2015   Asthma in adult, moderate persistent, with status asthmaticus 10/26/2020   Benign hypertension 10/26/2019   Chest pain of uncertain etiology 08/29/2021   Chronic kidney disease (CKD) 08/13/2023   Dr Camie Moats     Coronary artery disease 06/20/2015   Depression    Dysuria 02/01/2024   Elevated liver enzymes 02/01/2024   Elevated transaminase level 02/01/2024   Exertional dyspnea 04/25/2024   Fibromyalgia 09/21/2020   Gastroesophageal reflux disease without esophagitis 10/26/2019   GERD (gastroesophageal reflux disease)    Hyperlipidemia    Hypertension    Kyphosis deformity of spine 10/26/2020   Leukocytes in urine 02/16/2024   Migraine 01/31/2020   Mixed hyperlipidemia 10/26/2019   Morbid obesity (HCC) 08/17/2020   Multicystic  dysplastic kidney (MCDK) 10/26/2020   Nail disorder 05/14/2023   NASH (nonalcoholic steatohepatitis) 10/26/2020   Need for prophylactic vaccination against Streptococcus pneumoniae (pneumococcus) 02/18/2021   Need for prophylactic vaccination and inoculation against influenza 05/03/2020   Need for shingles vaccine 08/06/2022   Needs flu shot 05/14/2023   Osteoarthritis (arthritis due to wear and tear of joints) 11/20/2023   Other fatigue 10/26/2019   Pain in joint, lower leg    Pharyngitis 10/26/2020   Polyuria 04/07/2023   Precordial pain 08/17/2020   Prediabetes 01/31/2020   Shortness of breath 08/17/2020   Sleep apnea in adult 09/21/2020   Syncope 04/25/2024    Thrombocytopenia    Vaginal bleeding 08/12/2023   Vitamin D  deficiency 10/26/2019    Tobacco History: Social History   Tobacco Use  Smoking Status Some Days   Current packs/day: 0.50   Average packs/day: 0.5 packs/day for 35.9 years (17.9 ttl pk-yrs)   Types: Cigarettes   Start date: 1990  Smokeless Tobacco Never   Ready to quit: Not Answered Counseling given: Not Answered   Outpatient Medications Prior to Visit  Medication Sig Dispense Refill   albuterol  (VENTOLIN  HFA) 108 (90 Base) MCG/ACT inhaler INHALE 1-2 PUFFS INTO THE LUNGS EVERY 6 HOURS AS NEEDED FOR WHEEZING OR SHORTNESS OF BREATH 8.5 g 2   amitriptyline  (ELAVIL ) 150 MG tablet Take 1 tablet (150 mg total) by mouth at bedtime. 30 tablet 2   amLODipine (NORVASC) 2.5 MG tablet Take 1 tablet (2.5 mg total) by mouth daily. 90 tablet 3   atenolol  (TENORMIN ) 25 MG tablet Take 12.5 mg by mouth daily.     atorvastatin  (LIPITOR) 80 MG tablet Take 1 tablet by mouth ONCE daily. 90 tablet 1   bumetanide  (BUMEX ) 2 MG tablet Take 2 mg by mouth as needed (swelling).     clonazePAM  (KLONOPIN ) 0.5 MG tablet Take 1 tablet (0.5 mg total) by mouth at bedtime. 30 tablet 1   EMGALITY 120 MG/ML SOAJ Inject 120 mg into the skin every 30 (thirty) days.     KRILL OIL PO Take 1 capsule by mouth daily.     meloxicam  (MOBIC ) 15 MG tablet Take 1 tablet by mouth once daily. 90 tablet 1   nitroGLYCERIN  (NITROSTAT ) 0.4 MG SL tablet PLACE 1 TABLET UNDER THE TONGUE AS NEEDED FOR CHEST PAIN ( MAY REPEAT EVERY5 MINUTES X 3) 25 tablet 3   OXYGEN  Inhale 1 L into the lungs at bedtime.     pregabalin  (LYRICA ) 75 MG capsule Take 1 capsule (75 mg total) by mouth daily.     promethazine (PHENERGAN) 25 MG tablet Take 25 mg by mouth every 8 (eight) hours as needed.     QUEtiapine  Fumarate (SEROQUEL  XR) 150 MG 24 hr tablet Take 1 tablet (150 mg total) by mouth at bedtime. 30 tablet 2   topiramate  (TOPAMAX ) 100 MG tablet Take 1 tablet (100 mg total) by mouth daily. 30  tablet 2   Ubrogepant  (UBRELVY ) 100 MG TABS Take 100 mg by mouth 2 (two) times daily as needed (migraine).     Vitamin D , Ergocalciferol , (DRISDOL ) 1.25 MG (50000 UNIT) CAPS capsule Take 1 capsule by mouth twice a week as directed (Patient taking differently: Take 2 capsules by mouth once a week. Take 1 capsule by mouth twice a week as directed) 10 capsule 1   No facility-administered medications prior to visit.      Review of Systems  Review of Systems  Constitutional:  Negative for fatigue.  Respiratory:  Negative for cough, shortness of breath and wheezing.   Cardiovascular: Negative.   Neurological:  Negative for syncope.    Physical Exam  LMP  (LMP Unknown)  Physical Exam Constitutional:      Appearance: Normal appearance. She is well-developed.  HENT:     Head: Normocephalic and atraumatic.     Mouth/Throat:     Mouth: Mucous membranes are moist.     Pharynx: Oropharynx is clear.  Eyes:     Pupils: Pupils are equal, round, and reactive to light.  Cardiovascular:     Rate and Rhythm: Normal rate and regular rhythm.     Heart sounds: Normal heart sounds. No murmur heard. Pulmonary:     Effort: Pulmonary effort is normal. No respiratory distress.     Breath sounds: Normal breath sounds. No wheezing or rhonchi.  Musculoskeletal:        General: Normal range of motion.     Cervical back: Normal range of motion and neck supple.  Skin:    General: Skin is warm and dry.     Findings: No erythema or rash.  Neurological:     General: No focal deficit present.     Mental Status: She is alert and oriented to person, place, and time. Mental status is at baseline.  Psychiatric:        Mood and Affect: Mood normal.        Behavior: Behavior normal.        Thought Content: Thought content normal.        Judgment: Judgment normal.     Lab Results:  CBC    Component Value Date/Time   WBC CANCELED 07/07/2024 1503   WBC 7.8 03/09/2024 1359   WBC 7.1 08/25/2021 0814    RBC CANCELED 07/07/2024 1503   RBC 4.96 03/09/2024 1359   HGB CANCELED 07/07/2024 1503   HCT CANCELED 07/07/2024 1503   PLT CANCELED 07/07/2024 1503   MCV 97 06/03/2024 0805   MCH 32.1 06/03/2024 0805   MCH 31.3 03/09/2024 1359   MCHC 33.0 06/03/2024 0805   MCHC 33.4 03/09/2024 1359   RDW 13.3 06/03/2024 0805   LYMPHSABS CANCELED 07/07/2024 1503   MONOABS 0.6 03/09/2024 1359   EOSABS CANCELED 07/07/2024 1503   BASOSABS CANCELED 07/07/2024 1503    BMET    Component Value Date/Time   NA 142 07/07/2024 1503   K 4.2 07/07/2024 1503   CL 102 07/07/2024 1503   CO2 21 07/07/2024 1503   GLUCOSE 89 07/07/2024 1503   GLUCOSE 89 02/24/2024 1413   BUN 22 07/07/2024 1503   CREATININE 1.15 (H) 07/07/2024 1503   CREATININE 1.07 (H) 02/24/2024 1413   CALCIUM  9.5 07/07/2024 1503   GFRNONAA >60 02/24/2024 1413   GFRAA 94 08/14/2020 1101    BNP No results found for: BNP  ProBNP No results found for: PROBNP  Imaging: CUP PACEART REMOTE DEVICE CHECK Result Date: 06/19/2024 ILR summary report received. Battery status OK. Normal device function. No new tachy, brady, or pause episodes. No new AF episodes. 1 symptom event c/w SR/ST.  Monthly summary reports and ROV/PRN ML, CVRS    Assessment & Plan:   1. Sleep apnea in adult (Primary) - Split night study; Future    Assessment and Plan Assessment & Plan Obstructive sleep apnea with nocturnal hypoxia Obstructive sleep apnea with nocturnal hypoxia, previously managed with CPAP, which was recalled. Recent sleep study indicated need for nocturnal oxygen . Current use of 1.5 liters of oxygen  at night. Untreated  sleep apnea can contribute to pulmonary hypertension and cardiac stress. - Ordered split night sleep study in the hospital - Continue 1.5 liters of oxygen  at night - If sleep study indicates sleep apnea, will initiate CPAP therapy  Pulmonary hypertension Severe pulmonary hypertension with reduced right ventricular systolic  function and moderate dilation of the right ventricle. Potential contributing factors include obesity, smoking history, heart failure, lung disease and untreated sleep apnea. Recent hospitalization for lithotripsy complicated by respiratory failure, likely related to anesthesia and underlying conditions. - Ordered split night sleep study to assess for sleep apnea - Scheduled for pulmonary function testing in December to assess for obstructive lung disease - Continue follow-up with cardiology   Recurrent syncope and near-syncope with lightheadedness and dizziness Recurrent syncope and near-syncope episodes, previously evaluated with loop recorder showing no significant arrhythmias. Episodes likely related to polypharmacy and recent medication adjustments. No recent syncope episodes since hospital discharge, though dizziness persists. - Continue current medication regimen with recent adjustments  Shortness of breath with wheezing and chest tightness Shortness of breath with wheezing and chest tightness, particularly during exertion. Symptoms alleviated by rest. Excessive albuterol  use may contribute to symptoms. Pulmonary function testing scheduled to assess for smoking-related lung disease. - Resume Stiolto inhaler, two puffs every morning - Avoid using inhalers on the day of pulmonary function testing - Attend pulmonary function testing on December 17th  Former smoker Remote smoking history, with recent cessation three weeks ago. Smoking history includes intermittent use since age 49, with a significant period of cessation from 2000 to 2019, followed by resumption due to stress.  I personally spent a total of 40 minutes in the care of the patient today including performing a medically appropriate exam/evaluation, counseling and educating, placing orders, independently interpreting results, communicating results, and coordinating care.   Cindy LELON Ferrari, NP 07/14/2024

## 2024-07-18 ENCOUNTER — Encounter

## 2024-07-19 ENCOUNTER — Inpatient Hospital Stay: Admitting: Physician Assistant

## 2024-07-20 ENCOUNTER — Encounter: Payer: Self-pay | Admitting: Physician Assistant

## 2024-07-21 ENCOUNTER — Ambulatory Visit: Attending: Cardiology

## 2024-07-21 DIAGNOSIS — I5032 Chronic diastolic (congestive) heart failure: Secondary | ICD-10-CM | POA: Diagnosis not present

## 2024-07-21 LAB — CUP PACEART REMOTE DEVICE CHECK
Date Time Interrogation Session: 20251119234230
Implantable Pulse Generator Implant Date: 20250915

## 2024-07-24 ENCOUNTER — Ambulatory Visit: Payer: Self-pay | Admitting: Cardiology

## 2024-07-24 DIAGNOSIS — I509 Heart failure, unspecified: Secondary | ICD-10-CM | POA: Diagnosis not present

## 2024-07-24 DIAGNOSIS — R070 Pain in throat: Secondary | ICD-10-CM | POA: Diagnosis not present

## 2024-07-24 DIAGNOSIS — J029 Acute pharyngitis, unspecified: Secondary | ICD-10-CM | POA: Diagnosis not present

## 2024-07-24 DIAGNOSIS — R6883 Chills (without fever): Secondary | ICD-10-CM | POA: Diagnosis not present

## 2024-07-25 NOTE — Progress Notes (Signed)
 Remote Loop Recorder Transmission

## 2024-07-26 ENCOUNTER — Ambulatory Visit: Admitting: Cardiology

## 2024-07-26 DIAGNOSIS — R339 Retention of urine, unspecified: Secondary | ICD-10-CM | POA: Diagnosis not present

## 2024-07-26 DIAGNOSIS — N202 Calculus of kidney with calculus of ureter: Secondary | ICD-10-CM | POA: Diagnosis not present

## 2024-07-27 ENCOUNTER — Other Ambulatory Visit: Payer: Self-pay | Admitting: Physician Assistant

## 2024-07-27 ENCOUNTER — Other Ambulatory Visit

## 2024-07-27 ENCOUNTER — Ambulatory Visit: Admitting: Pulmonary Disease

## 2024-07-27 ENCOUNTER — Encounter: Payer: Self-pay | Admitting: Pulmonary Disease

## 2024-07-27 DIAGNOSIS — R899 Unspecified abnormal finding in specimens from other organs, systems and tissues: Secondary | ICD-10-CM

## 2024-07-27 NOTE — Telephone Encounter (Signed)
 ATC patient x1 to offer an appointment today at 3:45 pm to arrive by 3:30 pm to see Dr. Annella.  Left detailed VM with the above information and requested that she return our call to let us  know if this time works for her or not.  I also sent her a mychart message as well.  Will await return call.

## 2024-07-28 LAB — CBC WITH DIFFERENTIAL/PLATELET
Basophils Absolute: 0.1 x10E3/uL (ref 0.0–0.2)
Basos: 1 %
EOS (ABSOLUTE): 0.4 x10E3/uL (ref 0.0–0.4)
Eos: 4 %
Hematocrit: 48.9 % — ABNORMAL HIGH (ref 34.0–46.6)
Hemoglobin: 16.8 g/dL — ABNORMAL HIGH (ref 11.1–15.9)
Immature Grans (Abs): 0.1 x10E3/uL (ref 0.0–0.1)
Immature Granulocytes: 1 %
Lymphocytes Absolute: 2.5 x10E3/uL (ref 0.7–3.1)
Lymphs: 24 %
MCH: 32.4 pg (ref 26.6–33.0)
MCHC: 34.4 g/dL (ref 31.5–35.7)
MCV: 94 fL (ref 79–97)
Monocytes Absolute: 0.8 x10E3/uL (ref 0.1–0.9)
Monocytes: 8 %
Neutrophils Absolute: 6.6 x10E3/uL (ref 1.4–7.0)
Neutrophils: 62 %
Platelets: 242 x10E3/uL (ref 150–450)
RBC: 5.18 x10E6/uL (ref 3.77–5.28)
RDW: 12.2 % (ref 11.7–15.4)
WBC: 10.5 x10E3/uL (ref 3.4–10.8)

## 2024-07-28 LAB — PHOSPHORUS: Phosphorus: 5.4 mg/dL — ABNORMAL HIGH (ref 3.0–4.3)

## 2024-07-29 ENCOUNTER — Other Ambulatory Visit: Payer: Self-pay | Admitting: Physician Assistant

## 2024-07-29 ENCOUNTER — Ambulatory Visit: Payer: Self-pay | Admitting: Physician Assistant

## 2024-07-29 DIAGNOSIS — R899 Unspecified abnormal finding in specimens from other organs, systems and tissues: Secondary | ICD-10-CM

## 2024-08-10 ENCOUNTER — Encounter: Payer: Self-pay | Admitting: Physician Assistant

## 2024-08-10 ENCOUNTER — Ambulatory Visit: Admitting: Physician Assistant

## 2024-08-10 VITALS — BP 80/60 | HR 93 | Temp 98.0°F | Resp 20 | Ht 60.0 in | Wt 232.2 lb

## 2024-08-10 DIAGNOSIS — R42 Dizziness and giddiness: Secondary | ICD-10-CM | POA: Diagnosis not present

## 2024-08-10 DIAGNOSIS — I270 Primary pulmonary hypertension: Secondary | ICD-10-CM | POA: Diagnosis not present

## 2024-08-10 DIAGNOSIS — I9589 Other hypotension: Secondary | ICD-10-CM

## 2024-08-10 LAB — MAGNESIUM: Magnesium: 2.1 mg/dL (ref 1.6–2.3)

## 2024-08-10 LAB — COMPREHENSIVE METABOLIC PANEL WITH GFR
ALT: 22 IU/L (ref 0–32)
AST: 26 IU/L (ref 0–40)
Albumin: 4 g/dL (ref 3.8–4.9)
Alkaline Phosphatase: 207 IU/L — ABNORMAL HIGH (ref 49–135)
BUN/Creatinine Ratio: 14 (ref 9–23)
BUN: 17 mg/dL (ref 6–24)
Bilirubin Total: 0.9 mg/dL (ref 0.0–1.2)
CO2: 21 mmol/L (ref 20–29)
Calcium: 9.3 mg/dL (ref 8.7–10.2)
Chloride: 101 mmol/L (ref 96–106)
Creatinine, Ser: 1.25 mg/dL — ABNORMAL HIGH (ref 0.57–1.00)
Globulin, Total: 3.9 g/dL (ref 1.5–4.5)
Glucose: 123 mg/dL — ABNORMAL HIGH (ref 70–99)
Potassium: 3.7 mmol/L (ref 3.5–5.2)
Sodium: 139 mmol/L (ref 134–144)
Total Protein: 7.9 g/dL (ref 6.0–8.5)
eGFR: 50 mL/min/1.73 — ABNORMAL LOW (ref >59)

## 2024-08-10 LAB — CBC WITH DIFFERENTIAL/PLATELET
Basophils Absolute: 0.1 x10E3/uL (ref 0.0–0.2)
Basos: 1 %
EOS (ABSOLUTE): 0.3 x10E3/uL (ref 0.0–0.4)
Eos: 3 %
Hematocrit: 51.9 % — ABNORMAL HIGH (ref 34.0–46.6)
Hemoglobin: 17.2 g/dL — ABNORMAL HIGH (ref 11.1–15.9)
Immature Grans (Abs): 0 x10E3/uL (ref 0.0–0.1)
Immature Granulocytes: 0 %
Lymphocytes Absolute: 2.1 x10E3/uL (ref 0.7–3.1)
Lymphs: 18 %
MCH: 31.4 pg (ref 26.6–33.0)
MCHC: 33.1 g/dL (ref 31.5–35.7)
MCV: 95 fL (ref 79–97)
Monocytes Absolute: 0.9 x10E3/uL (ref 0.1–0.9)
Monocytes: 8 %
Neutrophils Absolute: 8 x10E3/uL — ABNORMAL HIGH (ref 1.4–7.0)
Neutrophils: 70 %
Platelets: 269 x10E3/uL (ref 150–450)
RBC: 5.47 x10E6/uL — ABNORMAL HIGH (ref 3.77–5.28)
RDW: 12.8 % (ref 11.7–15.4)
WBC: 11.3 x10E3/uL — ABNORMAL HIGH (ref 3.4–10.8)

## 2024-08-10 NOTE — Progress Notes (Signed)
 Subjective:  Patient ID: Cindy Hammond, female    DOB: 1966/07/02  Age: 58 y.o. MRN: [id number]  Chief Complaint  Patient presents with   Dizziness    HPI Pt in today with persistent complaints of dizziness and low blood pressure.  She is currently following with cardiology and she also has LOOP recorder Pt states she starts walking and gets light headed and feels like she is going to pass out - a few times she has passed out So far her evaluation has shown pulmonary hypertension - she is on oxygen  use at home at night but feels her oxygen  also drops during the day She missed her last appt with pulmonology but has rescheduled that appt Currently she is on toprol XL 12.5mg  qd and norvasc  2.5mg  --- Her bp is faint and only at 74/62 initially She denies chest pain or shortness of breath today but did have a spell coming into office feeling like she would pass out that resolved after about 2 minutes     07/06/2024    9:37 AM 06/07/2024    1:16 PM 04/05/2024    2:50 PM 03/09/2024    2:00 PM 02/24/2024    2:56 PM  Depression screen PHQ 2/9  Decreased Interest 0 0 0 0 0  Down, Depressed, Hopeless 1  0 0 0  PHQ - 2 Score 1 0 0 0 0  Altered sleeping  0  0 0  Tired, decreased energy  2  1 1   Change in appetite  0  1 1  Feeling bad or failure about yourself   0  0 0  Trouble concentrating  0  0 0  Moving slowly or fidgety/restless  0     Suicidal thoughts  0  0 0  PHQ-9 Score  2   2  2    Difficult doing work/chores  Not difficult at all        Data saved with a previous flowsheet row definition        12/15/2023    8:10 AM 02/16/2024    2:40 PM 04/05/2024    2:50 PM 06/07/2024    1:16 PM 07/14/2024    8:38 AM  Fall Risk  Falls in the past year? 0 0 1 0 1  Was there an injury with Fall? 0  0  0  0  0   Fall Risk Category Calculator 0 0 2 0 2  Patient at Risk for Falls Due to No Fall Risks No Fall Risks History of fall(s) No Fall Risks   Fall risk Follow up  Falls evaluation completed Falls  evaluation completed Falls evaluation completed      Data saved with a previous flowsheet row definition     ROS CONSTITUTIONAL: Negative for chills, fatigue, fever,  E/N/T: Negative for ear pain, nasal congestion and sore throat.  CARDIOVASCULAR: see HPI RESPIRATORY: Negative for recent cough and dyspnea.  GASTROINTESTINAL: Negative for abdominal pain, acid reflux symptoms, constipation, diarrhea, nausea and vomiting.  MSK: Negative for arthralgias and myalgias.  INTEGUMENTARY: Negative for rash.  NEUROLOGICAL: see HPI PSYCHIATRIC: Negative for sleep disturbance and to question depression screen.  Negative for depression, negative for anhedonia.    Current Outpatient Medications:    albuterol  (VENTOLIN  HFA) 108 (90 Base) MCG/ACT inhaler, INHALE 1-2 PUFFS INTO THE LUNGS EVERY 6 HOURS AS NEEDED FOR WHEEZING OR SHORTNESS OF BREATH, Disp: 8.5 g, Rfl: 2   amitriptyline  (ELAVIL ) 150 MG tablet, Take 1 tablet (150 mg total) by  mouth at bedtime., Disp: 30 tablet, Rfl: 2   atorvastatin  (LIPITOR) 80 MG tablet, Take 1 tablet by mouth ONCE daily., Disp: 90 tablet, Rfl: 1   bumetanide  (BUMEX ) 2 MG tablet, Take 2 mg by mouth as needed (swelling)., Disp: , Rfl:    clonazePAM  (KLONOPIN ) 0.5 MG tablet, Take 1 tablet (0.5 mg total) by mouth at bedtime., Disp: 30 tablet, Rfl: 1   EMGALITY 120 MG/ML SOAJ, Inject 120 mg into the skin every 30 (thirty) days., Disp: , Rfl:    KRILL OIL PO, Take 1 capsule by mouth daily., Disp: , Rfl:    meloxicam  (MOBIC ) 15 MG tablet, Take 1 tablet by mouth once daily., Disp: 90 tablet, Rfl: 1   montelukast (SINGULAIR) 10 MG tablet, Take 10 mg by mouth daily., Disp: , Rfl:    nitroGLYCERIN  (NITROSTAT ) 0.4 MG SL tablet, PLACE 1 TABLET UNDER THE TONGUE AS NEEDED FOR CHEST PAIN ( MAY REPEAT EVERY5 MINUTES X 3), Disp: 25 tablet, Rfl: 3   omeprazole  (PRILOSEC) 40 MG capsule, Take 40 mg by mouth daily., Disp: , Rfl:    OXYGEN , Inhale 1 L into the lungs at bedtime., Disp: , Rfl:     pregabalin  (LYRICA ) 75 MG capsule, Take 1 capsule by mouth 3 times daily., Disp: 270 capsule, Rfl: 0   promethazine (PHENERGAN) 25 MG tablet, Take 25 mg by mouth every 8 (eight) hours as needed., Disp: , Rfl:    QUEtiapine  Fumarate (SEROQUEL  XR) 150 MG 24 hr tablet, Take 1 tablet (150 mg total) by mouth at bedtime., Disp: 30 tablet, Rfl: 2   topiramate  (TOPAMAX ) 100 MG tablet, Take 1 tablet (100 mg total) by mouth daily., Disp: 30 tablet, Rfl: 2   Ubrogepant  (UBRELVY ) 100 MG TABS, Take 100 mg by mouth 2 (two) times daily as needed (migraine)., Disp: , Rfl:   Past Medical History:  Diagnosis Date   Abnormal chest x-ray with multiple lung nodules 10/26/2020   Abnormal laboratory test 02/16/2024   Acute cystitis without hematuria 02/01/2024   Acute hemorrhagic cystitis 07/23/2023   Acute laryngopharyngitis 05/22/2020   Anxiety    Asthma 06/20/2015   Asthma in adult, moderate persistent, with status asthmaticus 10/26/2020   Benign hypertension 10/26/2019   Chest pain of uncertain etiology 08/29/2021   Chronic kidney disease (CKD) 08/13/2023   Dr Camie Moats     Coronary artery disease 06/20/2015   Depression    Dysuria 02/01/2024   Elevated liver enzymes 02/01/2024   Elevated transaminase level 02/01/2024   Exertional dyspnea 04/25/2024   Fibromyalgia 09/21/2020   Gastroesophageal reflux disease without esophagitis 10/26/2019   GERD (gastroesophageal reflux disease)    Hyperlipidemia    Hypertension    Kyphosis deformity of spine 10/26/2020   Leukocytes in urine 02/16/2024   Migraine 01/31/2020   Mixed hyperlipidemia 10/26/2019   Morbid obesity (HCC) 08/17/2020   Multicystic dysplastic kidney (MCDK) 10/26/2020   Nail disorder 05/14/2023   NASH (nonalcoholic steatohepatitis) 10/26/2020   Need for prophylactic vaccination against Streptococcus pneumoniae (pneumococcus) 02/18/2021   Need for prophylactic vaccination and inoculation against influenza 05/03/2020   Need for shingles  vaccine 08/06/2022   Needs flu shot 05/14/2023   Osteoarthritis (arthritis due to wear and tear of joints) 11/20/2023   Other fatigue 10/26/2019   Pain in joint, lower leg    Pharyngitis 10/26/2020   Polyuria 04/07/2023   Precordial pain 08/17/2020   Prediabetes 01/31/2020   Shortness of breath 08/17/2020   Sleep apnea in adult 09/21/2020   Syncope  04/25/2024   Thrombocytopenia    Vaginal bleeding 08/12/2023   Vitamin D  deficiency 10/26/2019   Objective:  PHYSICAL EXAM:   BP (!) 80/60   Pulse 93   Temp 98 F (36.7 C) (Temporal)   Resp 20   Ht 5' (1.524 m)   Wt 232 lb 3.2 oz (105.3 kg)   LMP  (LMP Unknown)   SpO2 91%   BMI 45.35 kg/m    GEN: Well nourished, well developed, in no acute distress  Cardiac: RRR; no murmurs, rubs, or gallops,no edema -  Respiratory:  normal respiratory rate and pattern with no distress - normal breath sounds with no rales, rhonchi, wheezes or rubs MS: no deformity or atrophy  Skin: warm and dry, no rash  Neuro:  Alert and Oriented x 3, - CN II-Xii grossly intact Psych: euthymic mood, appropriate affect and demeanor  EKG sinus arrhythmia noted Assessment & Plan:    Dizziness -     CBC with Differential/Platelet -     Comprehensive metabolic panel with GFR -     Magnesium -     EKG 12-Lead  Other specified hypotension Stop norvasc  and atenolol  Pulmonary hypertension, primary (HCC) Consulted with Dr Madireddy - will stop both norvasc  and toprol at this time Slow hydration with continued use of bumex  Follow up with him as scheduled Will have patient use oxygen  continuously Follow up with pulmonology as scheduled    Follow-up: Return in about 2 weeks (around 08/24/2024) for follow-up.  An After Visit Summary was printed and given to the patient.  CAMIE JONELLE NICHOLAUS DEVONNA [facility] [phone]

## 2024-08-11 ENCOUNTER — Ambulatory Visit: Payer: Self-pay | Admitting: Physician Assistant

## 2024-08-11 DIAGNOSIS — J4540 Moderate persistent asthma, uncomplicated: Secondary | ICD-10-CM | POA: Diagnosis not present

## 2024-08-17 ENCOUNTER — Encounter

## 2024-08-17 ENCOUNTER — Ambulatory Visit: Admitting: Internal Medicine

## 2024-08-18 ENCOUNTER — Encounter

## 2024-08-18 DIAGNOSIS — Z463 Encounter for fitting and adjustment of dental prosthetic device: Secondary | ICD-10-CM | POA: Diagnosis not present

## 2024-08-21 ENCOUNTER — Ambulatory Visit

## 2024-08-21 DIAGNOSIS — I5032 Chronic diastolic (congestive) heart failure: Secondary | ICD-10-CM | POA: Diagnosis not present

## 2024-08-23 ENCOUNTER — Other Ambulatory Visit: Payer: Self-pay | Admitting: Physician Assistant

## 2024-08-23 DIAGNOSIS — E559 Vitamin D deficiency, unspecified: Secondary | ICD-10-CM

## 2024-08-23 LAB — CUP PACEART REMOTE DEVICE CHECK
Date Time Interrogation Session: 20251220233448
Implantable Pulse Generator Implant Date: 20250915

## 2024-08-24 NOTE — Progress Notes (Signed)
 Remote Loop Recorder Transmission

## 2024-08-27 ENCOUNTER — Ambulatory Visit: Payer: Self-pay | Admitting: Cardiology

## 2024-08-29 ENCOUNTER — Encounter: Payer: Self-pay | Admitting: Physician Assistant

## 2024-08-31 ENCOUNTER — Other Ambulatory Visit: Payer: Self-pay | Admitting: Physician Assistant

## 2024-08-31 ENCOUNTER — Encounter: Payer: Self-pay | Admitting: Physician Assistant

## 2024-08-31 ENCOUNTER — Ambulatory Visit (INDEPENDENT_AMBULATORY_CARE_PROVIDER_SITE_OTHER): Admitting: Physician Assistant

## 2024-08-31 VITALS — BP 122/100 | HR 95 | Temp 98.0°F | Resp 18 | Ht 60.0 in | Wt 240.8 lb

## 2024-08-31 DIAGNOSIS — N3001 Acute cystitis with hematuria: Secondary | ICD-10-CM | POA: Diagnosis not present

## 2024-08-31 DIAGNOSIS — I270 Primary pulmonary hypertension: Secondary | ICD-10-CM

## 2024-08-31 DIAGNOSIS — F419 Anxiety disorder, unspecified: Secondary | ICD-10-CM

## 2024-08-31 LAB — POCT URINALYSIS DIP (CLINITEK)
Bilirubin, UA: NEGATIVE
Glucose, UA: NEGATIVE mg/dL
Ketones, POC UA: NEGATIVE mg/dL
Nitrite, UA: NEGATIVE
POC PROTEIN,UA: NEGATIVE
Spec Grav, UA: 1.01
Urobilinogen, UA: NEGATIVE U/dL — AB
pH, UA: 6.5

## 2024-08-31 MED ORDER — ATENOLOL 25 MG PO TABS: ORAL_TABLET | ORAL | Status: AC

## 2024-08-31 MED ORDER — CLONAZEPAM 0.5 MG PO TABS: 0.5000 mg | ORAL_TABLET | Freq: Every day | ORAL | 1 refills | Status: AC

## 2024-08-31 NOTE — Telephone Encounter (Signed)
 Copied from CRM #[id number]. Topic: Clinical - Medication Refill >> Aug 31, 2024 10:35 AM Hadassah PARAS wrote: Medication: clonazePAM  (KLONOPIN ) 0.5 MG tablet   Has the patient contacted their pharmacy? Yes (Agent: If no, request that the patient contact the pharmacy for the refill. If patient does not wish to contact the pharmacy document the reason why and proceed with request.) (Agent: If yes, when and what did the pharmacy advise?)  This is the patient's preferred pharmacy:  [facility] [address] - [place], [place] - [address] [place] [address] Phone: [phone] Fax: [phone]  Is this the correct pharmacy for this prescription? Yes If no, delete pharmacy and type the correct one.   Has the prescription been filled recently? Yes  Is the patient out of the medication? Yes  Has the patient been seen for an appointment in the last year OR does the patient have an upcoming appointment? Yes  Can we respond through MyChart? Yes  Agent: Please be advised that Rx refills may take up to 3 business days. We ask that you follow-up with your pharmacy.

## 2024-08-31 NOTE — Progress Notes (Signed)
 "  Subjective:  Patient ID: Cindy Hammond, female    DOB: November 05, 1965  Age: 59 y.o. MRN: [id number]  Chief Complaint  Patient presents with   Follow up blood pressure    HPI Pt in today for blood pressure follow up - at last visit her bp had been dropping significantly to 60-80s/below 60s - we had advised her at that time to stop both norvasc  and atenolol .  Pt states overall she is feeling better but has noted her blood pressure has been elevating again - at home it has been ranging 120/100 per patient.  She denies chest pain/sob/dizziness today  Pt stated she was having urine urgency more than a week ago and currently with no symptoms but would like ua checked today     08/31/2024    9:38 AM 07/06/2024    9:37 AM 06/07/2024    1:16 PM 04/05/2024    2:50 PM 03/09/2024    2:00 PM  Depression screen PHQ 2/9  Decreased Interest 0 0 0 0 0  Down, Depressed, Hopeless 1 1  0 0  PHQ - 2 Score 1 1 0 0 0  Altered sleeping 0  0  0  Tired, decreased energy 2  2  1   Change in appetite 0  0  1  Feeling bad or failure about yourself  0  0  0  Trouble concentrating 0  0  0  Moving slowly or fidgety/restless 0  0    Suicidal thoughts 0  0  0  PHQ-9 Score 3  2   2    Difficult doing work/chores Not difficult at all  Not difficult at all       Data saved with a previous flowsheet row definition        02/16/2024    2:40 PM 04/05/2024    2:50 PM 06/07/2024    1:16 PM 07/14/2024    8:38 AM 08/31/2024    9:38 AM  Fall Risk  Falls in the past year? 0 1 0 1 1  Was there an injury with Fall? 0  0  0  0  0  Fall Risk Category Calculator 0 2 0 2 1  Patient at Risk for Falls Due to No Fall Risks History of fall(s) No Fall Risks  History of fall(s)  Fall risk Follow up Falls evaluation completed Falls evaluation completed Falls evaluation completed  Falls evaluation completed     Data saved with a previous flowsheet row definition     ROS CONSTITUTIONAL: Negative for chills, fatigue,  fever, CARDIOVASCULAR: Negative for chest pain, dizziness, palpitations and pedal edema.  RESPIRATORY: Negative for recent cough and dyspnea. GU - denies urine symptoms  GASTROINTESTINAL: Negative for abdominal pain, acid reflux symptoms, constipation, diarrhea, nausea and vomiting.    Current Medications[1]  Past Medical History:  Diagnosis Date   Abnormal chest x-ray with multiple lung nodules 10/26/2020   Abnormal laboratory test 02/16/2024   Acute cystitis without hematuria 02/01/2024   Acute hemorrhagic cystitis 07/23/2023   Acute laryngopharyngitis 05/22/2020   Anxiety    Asthma 06/20/2015   Asthma in adult, moderate persistent, with status asthmaticus 10/26/2020   Benign hypertension 10/26/2019   Chest pain of uncertain etiology 08/29/2021   Chronic kidney disease (CKD) 08/13/2023   Dr Camie Moats     Coronary artery disease 06/20/2015   Depression    Dysuria 02/01/2024   Elevated liver enzymes 02/01/2024   Elevated transaminase level 02/01/2024   Exertional dyspnea 04/25/2024  Fibromyalgia 09/21/2020   Gastroesophageal reflux disease without esophagitis 10/26/2019   GERD (gastroesophageal reflux disease)    Hyperlipidemia    Hypertension    Kyphosis deformity of spine 10/26/2020   Leukocytes in urine 02/16/2024   Migraine 01/31/2020   Mixed hyperlipidemia 10/26/2019   Morbid obesity (HCC) 08/17/2020   Multicystic dysplastic kidney (MCDK) 10/26/2020   Nail disorder 05/14/2023   NASH (nonalcoholic steatohepatitis) 10/26/2020   Need for prophylactic vaccination against Streptococcus pneumoniae (pneumococcus) 02/18/2021   Need for prophylactic vaccination and inoculation against influenza 05/03/2020   Need for shingles vaccine 08/06/2022   Needs flu shot 05/14/2023   Osteoarthritis (arthritis due to wear and tear of joints) 11/20/2023   Other fatigue 10/26/2019   Pain in joint, lower leg    Pharyngitis 10/26/2020   Polyuria 04/07/2023   Precordial pain  08/17/2020   Prediabetes 01/31/2020   Shortness of breath 08/17/2020   Sleep apnea in adult 09/21/2020   Syncope 04/25/2024   Thrombocytopenia    Vaginal bleeding 08/12/2023   Vitamin D  deficiency 10/26/2019   Objective:  PHYSICAL EXAM:   BP (!) 122/100   Pulse 95   Temp 98 F (36.7 C) (Temporal)   Resp 18   Ht 5' (1.524 m)   Wt 240 lb 12.8 oz (109.2 kg)   LMP  (LMP Unknown)   SpO2 92%   BMI 47.03 kg/m    GEN: Well nourished, well developed, in no acute distress  Cardiac: RRR; no murmurs, rubs, or gallops,no edema  Respiratory:  normal respiratory rate and pattern with no distress - normal breath sounds with no rales, rhonchi, wheezes or rubs Skin: warm and dry, no rash  Neuro:  Alert and Oriented x 3,  - CN II-Xii grossly intact Psych: euthymic mood, appropriate affect and demeanor  Office Visit on 08/31/2024  Component Date Value Ref Range Status   Color, UA 08/31/2024 yellow  yellow Final   Clarity, UA 08/31/2024 cloudy (A)  clear Final   Glucose, UA 08/31/2024 negative  negative mg/dL Final   Bilirubin, UA 87/68/7974 negative  negative Final   Ketones, POC UA 08/31/2024 negative  negative mg/dL Final   Spec Grav, UA 87/68/7974 1.010  1.010 - 1.025 Final   Blood, UA 08/31/2024 trace-intact (A)  negative Final   pH, UA 08/31/2024 6.5  5.0 - 8.0 Final   POC PROTEIN,UA 08/31/2024 negative  negative, trace Final   Urobilinogen, UA 08/31/2024 negative (A)  0.2 or 1.0 E.U./dL Final   Nitrite, UA 87/68/7974 Negative  Negative Final   Leukocytes, UA 08/31/2024 Small (1+) (A)  Negative Final     Assessment & Plan:    Pulmonary hypertension, primary (HCC) Restart atenolol  12.5mg  qd Recheck bp in 3 weeks Follow up with cardiology and pulmonology as directed Acute cystitis with hematuria -     POCT URINALYSIS DIP (CLINITEK) -     Urine Culture Will await culture results for treatment    Follow-up: Return in about 3 weeks (around 09/21/2024) for nurse visit bp  recheck.  An After Visit Summary was printed and given to the patient.  SARA R Jessicah Croll, PA-C [facility] [phone]     [1]  Current Outpatient Medications:    albuterol  (VENTOLIN  HFA) 108 (90 Base) MCG/ACT inhaler, INHALE 1-2 PUFFS INTO THE LUNGS EVERY 6 HOURS AS NEEDED FOR WHEEZING OR SHORTNESS OF BREATH, Disp: 8.5 g, Rfl: 2   amitriptyline  (ELAVIL ) 150 MG tablet, Take 1 tablet (150 mg total) by mouth at bedtime.,  Disp: 30 tablet, Rfl: 2   atorvastatin  (LIPITOR) 80 MG tablet, Take 1 tablet by mouth ONCE daily., Disp: 90 tablet, Rfl: 1   bumetanide  (BUMEX ) 2 MG tablet, Take 2 mg by mouth as needed (swelling)., Disp: , Rfl:    clonazePAM  (KLONOPIN ) 0.5 MG tablet, Take 1 tablet (0.5 mg total) by mouth at bedtime., Disp: 30 tablet, Rfl: 1   EMGALITY 120 MG/ML SOAJ, Inject 120 mg into the skin every 30 (thirty) days., Disp: , Rfl:    KRILL OIL PO, Take 1 capsule by mouth daily., Disp: , Rfl:    meloxicam  (MOBIC ) 15 MG tablet, Take 1 tablet by mouth once daily., Disp: 90 tablet, Rfl: 1   montelukast (SINGULAIR) 10 MG tablet, Take 10 mg by mouth daily., Disp: , Rfl:    nitroGLYCERIN  (NITROSTAT ) 0.4 MG SL tablet, PLACE 1 TABLET UNDER THE TONGUE AS NEEDED FOR CHEST PAIN ( MAY REPEAT EVERY5 MINUTES X 3), Disp: 25 tablet, Rfl: 3   omeprazole  (PRILOSEC) 40 MG capsule, Take 40 mg by mouth daily., Disp: , Rfl:    OXYGEN , Inhale 1 L into the lungs at bedtime., Disp: , Rfl:    pregabalin  (LYRICA ) 75 MG capsule, Take 1 capsule by mouth 3 times daily., Disp: 270 capsule, Rfl: 0   promethazine (PHENERGAN) 25 MG tablet, Take 25 mg by mouth every 8 (eight) hours as needed., Disp: , Rfl:    QUEtiapine  Fumarate (SEROQUEL  XR) 150 MG 24 hr tablet, Take 1 tablet (150 mg total) by mouth at bedtime., Disp: 30 tablet, Rfl: 2   topiramate  (TOPAMAX ) 100 MG tablet, Take 1 tablet (100 mg total) by mouth daily., Disp: 30 tablet, Rfl: 2   Ubrogepant  (UBRELVY ) 100 MG TABS, Take 100 mg by mouth 2 (two) times  daily as needed (migraine)., Disp: , Rfl:    Vitamin D , Ergocalciferol , (DRISDOL ) 1.25 MG (50000 UNIT) CAPS capsule, Take 1 capsule by mouth twice a week as directed, Disp: 10 capsule, Rfl: 1  "

## 2024-09-02 LAB — URINE CULTURE

## 2024-09-05 ENCOUNTER — Ambulatory Visit: Payer: Self-pay | Admitting: Physician Assistant

## 2024-09-09 ENCOUNTER — Telehealth: Payer: Self-pay

## 2024-09-09 ENCOUNTER — Encounter: Payer: Self-pay | Admitting: Physician Assistant

## 2024-09-09 NOTE — Telephone Encounter (Signed)
 Attempted to call patient left voicemail for her to get scheduled after her Sleep study and PFT for the week for February 9th.

## 2024-09-09 NOTE — Telephone Encounter (Signed)
 Pt is scheduled to see Almarie Ferrari, NP Monday, 09-12-2024. Pt's split night sleep study is not scheduled until 09-22-2024 and the PFT is not scheduled until 10-04-2024.   Front desk, please have pt rescheduled anytime the week of February 9th, 2026.

## 2024-09-12 ENCOUNTER — Ambulatory Visit: Payer: Self-pay | Admitting: Primary Care

## 2024-09-12 ENCOUNTER — Encounter: Payer: Self-pay | Admitting: Primary Care

## 2024-09-12 ENCOUNTER — Ambulatory Visit: Admitting: Primary Care

## 2024-09-12 VITALS — BP 136/85 | HR 77 | Temp 97.4°F | Ht 60.0 in | Wt 234.4 lb

## 2024-09-12 DIAGNOSIS — R55 Syncope and collapse: Secondary | ICD-10-CM | POA: Diagnosis not present

## 2024-09-12 DIAGNOSIS — N189 Chronic kidney disease, unspecified: Secondary | ICD-10-CM

## 2024-09-12 DIAGNOSIS — J9611 Chronic respiratory failure with hypoxia: Secondary | ICD-10-CM

## 2024-09-12 DIAGNOSIS — R0609 Other forms of dyspnea: Secondary | ICD-10-CM | POA: Diagnosis not present

## 2024-09-12 DIAGNOSIS — J45909 Unspecified asthma, uncomplicated: Secondary | ICD-10-CM | POA: Diagnosis not present

## 2024-09-12 DIAGNOSIS — F17210 Nicotine dependence, cigarettes, uncomplicated: Secondary | ICD-10-CM | POA: Diagnosis not present

## 2024-09-12 DIAGNOSIS — R0602 Shortness of breath: Secondary | ICD-10-CM

## 2024-09-12 DIAGNOSIS — G4730 Sleep apnea, unspecified: Secondary | ICD-10-CM

## 2024-09-12 LAB — BASIC METABOLIC PANEL WITH GFR
BUN: 20 mg/dL (ref 6–23)
CO2: 27 meq/L (ref 19–32)
Calcium: 9.2 mg/dL (ref 8.4–10.5)
Chloride: 104 meq/L (ref 96–112)
Creatinine, Ser: 1.2 mg/dL (ref 0.40–1.20)
GFR: 50.11 mL/min — ABNORMAL LOW
Glucose, Bld: 112 mg/dL — ABNORMAL HIGH (ref 70–99)
Potassium: 3.5 meq/L (ref 3.5–5.1)
Sodium: 140 meq/L (ref 135–145)

## 2024-09-12 LAB — BRAIN NATRIURETIC PEPTIDE: Pro B Natriuretic peptide (BNP): 298 pg/mL — ABNORMAL HIGH (ref 1.0–100.0)

## 2024-09-12 NOTE — Progress Notes (Signed)
 "  @Patient  ID: Cindy Hammond, female    DOB: 1965-11-24, 59 y.o.   MRN: [id number]  Chief Complaint  Patient presents with   Acute Visit    Having sob    Referring provider: Nicholaus Credit, PA-C  HPI: 59 year old female. PMH significant for HTN, pulmonry hypertension, asthma, sleep apnea, GERD, NASH, CKD, hyperlipidemia, obesity. Former patient of Dr. Meade.   09/12/2024-  Discussed the use of AI scribe software for clinical note transcription with the patient, who gave verbal consent to proceed.  History of Present Illness Cindy Hammond is a 58 year old female with chronic kidney disease and sleep apnea who presents with shortness of breath and exertional lightheadedness.  She has been experiencing ongoing shortness of breath and exertional lightheadedness since the summer, associated with low blood pressure. The shortness of breath occurs with minimal exertion, such as walking short distances, and she has had episodes of syncope, including a recent fall at church resulting in facial trauma. She uses oxygen  at night and feels dyspneic during the day, particularly with exertion. She has reduced her smoking from a pack a day to three or four cigarettes daily.  Her past cardiac evaluation includes an echocardiogram from April 2024 showing grade one diastolic dysfunction with normal ejection fraction and mild right ventricular enlargement. A heart catheterization in May 2025 was performed.  She has a history of sleep apnea and uses 1.5 liters of oxygen  at night. She has experienced a weight loss of approximately ten pounds recently. Her current medications include atenolol , half a tablet, and Bumex  as needed for swelling, used about 30 times a month. She has chronic kidney disease with a creatinine level of 1.2.  In April 2025, a CT scan of her chest was negative for blood clots, pneumonia, or pleural effusion, but showed scattered pulmonary nodules unchanged from 2022, considered benign. She  occasionally experiences brief chest pain with shortness of breath, both at rest and with exertion.   Allergies[1]  Immunization History  Administered Date(s) Administered   Fluad Trivalent(High Dose 65+) 05/14/2023   Fluzone Influenza virus vaccine,trivalent (IIV3), split virus 08/23/2012, 06/30/2013   Hep B, Unspecified 06/30/2000   Influenza Inj Mdck Quad Pf 06/16/2016, 06/28/2018, 05/03/2020, 05/22/2021, 06/02/2022   Influenza,trivalent, recombinat, inj, PF 05/05/2024   Influenza-Unspecified 07/25/2019   PNEUMOCOCCAL CONJUGATE-20 06/02/2022   Pneumococcal Polysaccharide-23 05/09/2020, 02/18/2021   Tdap 08/14/2020   Zoster Recombinant(Shingrix ) 08/06/2022    Past Medical History:  Diagnosis Date   Abnormal chest x-ray with multiple lung nodules 10/26/2020   Abnormal laboratory test 02/16/2024   Acute cystitis without hematuria 02/01/2024   Acute hemorrhagic cystitis 07/23/2023   Acute laryngopharyngitis 05/22/2020   Anxiety    Asthma 06/20/2015   Asthma in adult, moderate persistent, with status asthmaticus 10/26/2020   Benign hypertension 10/26/2019   Chest pain of uncertain etiology 08/29/2021   Chronic kidney disease (CKD) 08/13/2023   Dr Credit Nicholaus     Coronary artery disease 06/20/2015   Depression    Dysuria 02/01/2024   Elevated liver enzymes 02/01/2024   Elevated transaminase level 02/01/2024   Exertional dyspnea 04/25/2024   Fibromyalgia 09/21/2020   Gastroesophageal reflux disease without esophagitis 10/26/2019   GERD (gastroesophageal reflux disease)    Hyperlipidemia    Hypertension    Kyphosis deformity of spine 10/26/2020   Leukocytes in urine 02/16/2024   Migraine 01/31/2020   Mixed hyperlipidemia 10/26/2019   Morbid obesity (HCC) 08/17/2020   Multicystic dysplastic kidney (MCDK) 10/26/2020   Nail disorder  05/14/2023   NASH (nonalcoholic steatohepatitis) 10/26/2020   Need for prophylactic vaccination against Streptococcus pneumoniae (pneumococcus)  02/18/2021   Need for prophylactic vaccination and inoculation against influenza 05/03/2020   Need for shingles vaccine 08/06/2022   Needs flu shot 05/14/2023   Osteoarthritis (arthritis due to wear and tear of joints) 11/20/2023   Other fatigue 10/26/2019   Pain in joint, lower leg    Pharyngitis 10/26/2020   Polyuria 04/07/2023   Precordial pain 08/17/2020   Prediabetes 01/31/2020   Shortness of breath 08/17/2020   Sleep apnea in adult 09/21/2020   Syncope 04/25/2024   Thrombocytopenia    Vaginal bleeding 08/12/2023   Vitamin D  deficiency 10/26/2019    Tobacco History: Tobacco Use History[2] Counseling given: Not Answered   Outpatient Medications Prior to Visit  Medication Sig Dispense Refill   albuterol  (VENTOLIN  HFA) 108 (90 Base) MCG/ACT inhaler INHALE 1-2 PUFFS INTO THE LUNGS EVERY 6 HOURS AS NEEDED FOR WHEEZING OR SHORTNESS OF BREATH 8.5 g 2   amitriptyline  (ELAVIL ) 150 MG tablet Take 1 tablet (150 mg total) by mouth at bedtime. 30 tablet 2   atenolol  (TENORMIN ) 25 MG tablet 1/2 tablet po qd     atorvastatin  (LIPITOR) 80 MG tablet Take 1 tablet by mouth ONCE daily. 90 tablet 1   bumetanide  (BUMEX ) 2 MG tablet Take 2 mg by mouth as needed (swelling).     clonazePAM  (KLONOPIN ) 0.5 MG tablet Take 1 tablet (0.5 mg total) by mouth at bedtime. 30 tablet 1   KRILL OIL PO Take 1 capsule by mouth daily.     meloxicam  (MOBIC ) 15 MG tablet Take 1 tablet by mouth once daily. 90 tablet 1   montelukast (SINGULAIR) 10 MG tablet Take 10 mg by mouth daily.     nitroGLYCERIN  (NITROSTAT ) 0.4 MG SL tablet PLACE 1 TABLET UNDER THE TONGUE AS NEEDED FOR CHEST PAIN ( MAY REPEAT EVERY5 MINUTES X 3) 25 tablet 3   omeprazole  (PRILOSEC) 40 MG capsule Take 40 mg by mouth daily.     OXYGEN  Inhale 1 L into the lungs at bedtime.     pregabalin  (LYRICA ) 75 MG capsule Take 1 capsule by mouth 3 times daily. 270 capsule 0   promethazine (PHENERGAN) 25 MG tablet Take 25 mg by mouth every 8 (eight) hours as  needed.     QUEtiapine  Fumarate (SEROQUEL  XR) 150 MG 24 hr tablet Take 1 tablet (150 mg total) by mouth at bedtime. 30 tablet 2   topiramate  (TOPAMAX ) 100 MG tablet Take 1 tablet (100 mg total) by mouth daily. 30 tablet 2   Ubrogepant  (UBRELVY ) 100 MG TABS Take 100 mg by mouth 2 (two) times daily as needed (migraine).     EMGALITY 120 MG/ML SOAJ Inject 120 mg into the skin every 30 (thirty) days. (Patient not taking: Reported on 09/12/2024)     No facility-administered medications prior to visit.    Review of Systems  Review of Systems  Constitutional: Negative.   Respiratory: Negative.     Physical Exam  Pulse 76   Temp (!) 97.4 F (36.3 C) (Oral)   Ht 5' (1.524 m)   Wt 234 lb 6.4 oz (106.3 kg)   LMP  (LMP Unknown)   SpO2 96%   BMI 45.78 kg/m  Physical Exam Constitutional:      General: She is not in acute distress.    Appearance: Normal appearance. She is not ill-appearing.  HENT:     Head: Normocephalic and atraumatic.  Cardiovascular:  Rate and Rhythm: Normal rate and regular rhythm.  Pulmonary:     Effort: Pulmonary effort is normal.     Breath sounds: No wheezing or rhonchi.  Musculoskeletal:     Right lower leg: Edema present.     Left lower leg: Edema present.  Skin:    Comments: Superficial abrasion to forehead and nose  Neurological:     General: No focal deficit present.     Mental Status: She is alert and oriented to person, place, and time. Mental status is at baseline.  Psychiatric:        Mood and Affect: Mood normal.        Behavior: Behavior normal.        Thought Content: Thought content normal.        Judgment: Judgment normal.       Lab Results:  CBC    Component Value Date/Time   WBC 11.3 (H) 08/10/2024 1016   WBC 7.8 03/09/2024 1359   WBC 7.1 08/25/2021 0814   RBC 5.47 (H) 08/10/2024 1016   RBC 4.96 03/09/2024 1359   HGB 17.2 (H) 08/10/2024 1016   HCT 51.9 (H) 08/10/2024 1016   PLT 269 08/10/2024 1016   MCV 95 08/10/2024 1016    MCH 31.4 08/10/2024 1016   MCH 31.3 03/09/2024 1359   MCHC 33.1 08/10/2024 1016   MCHC 33.4 03/09/2024 1359   RDW 12.8 08/10/2024 1016   LYMPHSABS 2.1 08/10/2024 1016   MONOABS 0.6 03/09/2024 1359   EOSABS 0.3 08/10/2024 1016   BASOSABS 0.1 08/10/2024 1016    BMET    Component Value Date/Time   NA 139 08/10/2024 1016   K 3.7 08/10/2024 1016   CL 101 08/10/2024 1016   CO2 21 08/10/2024 1016   GLUCOSE 123 (H) 08/10/2024 1016   GLUCOSE 89 02/24/2024 1413   BUN 17 08/10/2024 1016   CREATININE 1.25 (H) 08/10/2024 1016   CREATININE 1.07 (H) 02/24/2024 1413   CALCIUM  9.3 08/10/2024 1016   GFRNONAA >60 02/24/2024 1413   GFRAA 94 08/14/2020 1101    BNP No results found for: BNP  ProBNP No results found for: PROBNP  Imaging: CUP PACEART REMOTE DEVICE CHECK Result Date: 08/23/2024 ILR summary report received. Battery status OK. Normal device function. No new symptom, tachy, brady, or pause episodes. No new AF episodes. Monthly summary reports and ROV/PRN LA, CVRS    Assessment & Plan:   No problem-specific Assessment & Plan notes found for this encounter.   1. Asthma, unspecified asthma severity, unspecified whether complicated, unspecified whether persistent (Primary)  2. Sleep apnea in adult   Assessment and Plan Assessment & Plan Exertional dyspnea and syncope evaluation Current smoker. Exertional dyspnea and syncope with episodes of lightheadedness and shortness of breath during exertion. Recent echocardiogram showed grade 1 diastolic dysfunction and mildly enlarged right ventricle. Heart catheterization in May 2025 showed elevated right ventricular and PA pressure, with dyspnea attributed to underlying lung disease, obesity, and deconditioning. Pulmonary testing is recommended to assess for underlying lung conditions. Oxygen  use during the day is considered, pending evaluation. - Ordered pulmonary function test - Performed sit and stand test to assess for  oxygen  need with exertion - Oxygen  desaturated 88% RA with sit/stand testing, requiring 2L to maintain O2 >90%. DME order placed 2L with exertion and at night.  - Scheduled appointment with pulmonologist specializing in pulmonary hypertension for January 2026  Vitals:   09/12/24 1329 09/12/24 1414 09/12/24 1415 09/12/24 1416  BP: 136/85 Comment: Right  wrist     Pulse:  70 Comment: on room air at rest 78 Comment: on room air while ambulating 77 Comment: on 2Liters while ambulating  Temp:      Height:      Weight:      SpO2:  94% Comment: on room air at rest (!) 88% Comment: on room air while ambulating 96% Comment: on 2 liters while ambulating  TempSrc:      BMI (Calculated):         Sleep apnea Potential contributor to pulmonary hypertension. Hx sleep apnea with previous CPAP use. Repeat testing at an outside facility did not require CPAP, but oxygen  was recommended. Weight has decreased by 10 pounds since last evaluation. Split night sleep study scheduled for January 22nd, 2026, to reassess sleep apnea. - Proceed with scheduled sleep study on January 22nd, 2026 - Will consider CPAP or BiPAP if sleep apnea is confirmed  Tobacco use  Smoking reduced from a pack a day to 3-4 cigarettes a day. - Encouraged smoking cessation - Ordered pulmonary function test  Chronic kidney disease Chronic kidney disease stage 3a with creatinine slightly elevated at 1.2 and GFR 50. Bumex  diuretic use may contribute to dehydration and fainting episodes. BNP and BMET to be checked to assess kidney function and heart failure status. - Checked BNP levels - Will consider discontinuing Bumex  if BNP is normal   Cindy LELON Ferrari, NP 09/12/2024     [1]  Allergies Allergen Reactions   Sumatriptan Hives and Rash   Sulfa Antibiotics   [2]  Social History Tobacco Use  Smoking Status Former   Current packs/day: 0.50   Average packs/day: 0.5 packs/day for 36.0 years (18.0 ttl pk-yrs)   Types:  Cigarettes   Start date: 20  Smokeless Tobacco Never   "

## 2024-09-12 NOTE — Progress Notes (Signed)
 Please let patient know BNP was elevated, she should continue to take bumex  as directed. Kidney function ok

## 2024-09-12 NOTE — Patient Instructions (Addendum)
" °  VISIT SUMMARY: During your visit, we discussed your ongoing shortness of breath and lightheadedness, particularly with exertion, and evaluated potential causes. We also reviewed your sleep apnea, chronic obstructive pulmonary disease (COPD), and chronic kidney disease. We have planned further tests and specialist consultations to better understand and manage your symptoms.  YOUR PLAN: -EXERTIONAL DYSPNEA AND SYNCOPE: You have been experiencing shortness of breath and lightheadedness during physical activity, which may be related to lung conditions, medication use, or low blood pressure. We have ordered a pulmonary function test and scheduled an appointment with a lung specialist to evaluate your condition further. We also assessed your oxygen  use during the day and performed a test to check for low blood pressure when standing up.  -SLEEP APNEA: Sleep apnea is a condition where breathing repeatedly stops and starts during sleep, which can contribute to high blood pressure in the lungs. We have scheduled a sleep study for January 22nd, 2026, to reassess your condition and will consider using a CPAP or BiPAP machine if sleep apnea is confirmed.  -CHRONIC OBSTRUCTIVE PULMONARY DISEASE (COPD): COPD is a chronic lung disease that makes it hard to breathe. You are currently using a Stiolto inhaler and have reduced your smoking. We have ordered a pulmonary function test to assess your current lung function and encourage you to continue working towards quitting smoking.  -CHRONIC KIDNEY DISEASE STAGE 3: Chronic kidney disease stage 3 means your kidneys are moderately damaged and not working as well as they should. We checked your BNP levels to assess for heart failure and will consider holding your Bumex  medication if the BNP levels are normal, as it may contribute to dehydration and fainting.  INSTRUCTIONS: Please follow up with the pulmonary function test and attend your scheduled appointments with the  pulmonologist on January 26th, 2026, and the sleep study on January 22nd, 2026. Continue using your Stiolto inhaler and work towards quitting smoking. We will review your BNP levels and adjust your medications as needed.  Follow-up: Scheduled new patient visit with Dr. Annella on January 26, 27 or 28- needs PFT prior (any location) "

## 2024-09-12 NOTE — Telephone Encounter (Signed)
 Pt has been schedule for her PFT on 10/04/24 and a TOC with Dr. Annella on 10/03/24

## 2024-09-13 NOTE — Progress Notes (Signed)
 I called and spoke with patient, advised of results/recommendations per Almarie Ferrari NP.  She verbalized understanding.  Nothing further needed.

## 2024-09-18 ENCOUNTER — Encounter

## 2024-09-19 ENCOUNTER — Ambulatory Visit

## 2024-09-19 ENCOUNTER — Encounter

## 2024-09-19 VITALS — BP 118/86

## 2024-09-19 DIAGNOSIS — I270 Primary pulmonary hypertension: Secondary | ICD-10-CM

## 2024-09-19 NOTE — Progress Notes (Signed)
 Patient is in office today for a nurse visit for Blood Pressure Check. Patient blood pressure was 118/86, Patient No chest pain, No shortness of breath, No dyspnea on exertion, No orthopnea, No paroxysmal nocturnal dyspnea, No edema, No palpitations, No syncope  Per Camie Moats, PA-C: Stay on current medication follow up as scheduled.

## 2024-09-21 ENCOUNTER — Encounter: Payer: Self-pay | Admitting: Physician Assistant

## 2024-09-21 ENCOUNTER — Ambulatory Visit

## 2024-09-21 DIAGNOSIS — I5032 Chronic diastolic (congestive) heart failure: Secondary | ICD-10-CM | POA: Diagnosis not present

## 2024-09-22 ENCOUNTER — Encounter: Payer: Self-pay | Admitting: Cardiology

## 2024-09-22 ENCOUNTER — Ambulatory Visit (HOSPITAL_BASED_OUTPATIENT_CLINIC_OR_DEPARTMENT_OTHER): Attending: Primary Care | Admitting: Pulmonary Disease

## 2024-09-22 ENCOUNTER — Other Ambulatory Visit (HOSPITAL_COMMUNITY): Payer: Self-pay

## 2024-09-22 DIAGNOSIS — G4730 Sleep apnea, unspecified: Secondary | ICD-10-CM | POA: Diagnosis present

## 2024-09-22 LAB — CUP PACEART REMOTE DEVICE CHECK
Date Time Interrogation Session: 20260120233948
Implantable Pulse Generator Implant Date: 20250915

## 2024-09-24 NOTE — Progress Notes (Signed)
 Remote Loop Recorder Transmission

## 2024-09-26 ENCOUNTER — Ambulatory Visit: Payer: Self-pay | Admitting: Cardiology

## 2024-10-02 ENCOUNTER — Telehealth: Payer: Self-pay | Admitting: Pulmonary Disease

## 2024-10-02 DIAGNOSIS — R0683 Snoring: Secondary | ICD-10-CM

## 2024-10-02 NOTE — Procedures (Signed)
 [facility] [address] [place], [place] [address] Tel: [phone]   Fax: [phone]  Polysomnography Interpretation  Patient Name:  Cindy Hammond, Cindy Hammond Date:  09/22/2024 Referring Physician:  ALMARIE FERRARI [phone])   Indications for Polysomnography The patient is a 59 year-old Female who is 5' and weighs 234.0 lbs. Her BMI equals 45.9.  A full night polysomnogram was performed to evaluate for -.  Medication  Quetiapine   Amitriptyline    Ranolazine   Topiramate     Polysomnogram Data A full night polysomnogram recorded the standard physiologic parameters including EEG, EOG, EMG, EKG, nasal and oral airflow.  Respiratory parameters of chest and abdominal movements were recorded with Respiratory Inductance Plethysmography belts.  Oxygen  saturation was recorded by pulse oximetry.   Sleep Architecture The total recording time of the polysomnogram was 414.2 minutes.  The total sleep time was 407.0 minutes.  The patient spent 0.5% of total sleep time in Stage N1, 70.3% in Stage N2, 8.6% in Stages N3, and 20.6% in REM.  Sleep latency was 7.5 minutes.  REM latency was 156.5 minutes.  Sleep Efficiency was 98.3%.  Wake after Sleep Onset time was - minutes.  Respiratory Events The polysomnogram revealed a presence of - obstructive, - central, and - mixed apneas resulting in an Apnea index of - events per hour.  There were 21 hypopneas (>=3% desaturation and/or arousal) resulting in an Apnea\Hypopnea Index (AHI >=3% desaturation and/or arousal) of 3.1 events per hour.  There were 7 hypopneas (>=4% desaturation) resulting in an Apnea\Hypopnea Index (AHI >=4% desaturation) of 1.0 events per hour.  There were - Respiratory Effort Related Arousals resulting in a RERA index of - events per hour. The Respiratory Disturbance Index is 3.1 events per hour.  The snore index was 3.8 events per hour.  Mean oxygen  saturation was 95.7%.  The lowest oxygen  saturation during  sleep was 92.0%.  Time spent <=88% oxygen  saturation was - minutes (-).  Limb Activity There were 10 total limb movements recorded, of this total, 4 were classified as PLMs.  PLM index was 0.6 per hour and PLM associated with Arousals index was 0.1 per hour.  Cardiac Summary The average pulse rate was 79.7 bpm.  The minimum pulse rate was 75.0 bpm while the maximum pulse rate was 86.0 bpm.  Cardiac rhythm was normal/abnormal.   Comments: Patient had a split-night study ordered, completed as a diagnostic study.   Study was performed on patient's usual oxygen  supplementation at 1.5 L  Diagnosis:  Negative study for significant sleep disordered breathing with AHI of 3.1. No significant oxygen  desaturations on 1.5 L of oxygen  Sleep efficiency was excellent with fair sleep architecture No significant periodic limb movement Cardiac rhythm was sinus  Recommendations: Clinical follow-up of symptoms Current study not consistent with presence of significant sleep disordered breathing. Patient should continue 1.5 L oxygen  supplementation Avoid alcohol, sedatives and other CNS depressants that may worsen sleep apnea and disrupt normal sleep architecture. Sleep hygiene should be reviewed to assess factors that may improve sleep quality. Weight management and regular exercise should be initiated or continued  Follow-up as previously scheduled  This study was personally reviewed and electronically signed by: Jennet Epley, MD Accredited Board Certified in Sleep Medicine Date/Time:   10/02/24    Diagnostic PSG Report  Patient Name: Cindy Hammond, Cindy Hammond Date: 09/22/2024  Date of Birth: 09/09/1965 Study Type: Diagnostic  Age: 59 year MRN #: [id number]  Sex: Female Interpreting Physician: EPLEY JENNET, [id number]  Height: 5'  Referring Physician: ALMARIE FERRARI [phone])  Weight: 234.0 lbs Recording Tech: Holly Neeriemer RPSGT RST  BMI: 45.9 Scoring Tech: Holly Neeriemer RPSGT RST  ESS: 5  Neck Size: 15.25  Mask Type ResMed Airfit F30 hybrid full face    Mask Size: small Supplemental O2: 1.5 LPM   Study Overview  Lights Off: 10:48:31 PM  Count Index  Lights On: 05:42:45 AM Awakenings: - 0.0  Time in Bed: 414.2 min. Arousals: 39 5.7  Total Sleep Time: 407.0 min. AHI (>=3% Desat and/or Ar.): 21 3.1   Sleep Efficiency: 98.3% AHI (>=4% Desat): 7 1.0   Sleep Latency: 7.5 min. Limb Movements: 10 1.5  Wake After Sleep Onset: - min. Snore: 26 3.8  REM Latency from Sleep Onset: 156.5 min. Desaturations: 25 3.7     Minimum SpO2 TST: 92.0%    Sleep Architecture  % of Time in Bed Stages Time (mins) % Sleep Time  Wake 7.5   Stage N1 2.0 0.5%  Stage N2 286.0 70.3%  Stage N3 35.0 8.6%  REM 84.0 20.6%   Arousal Summary   NREM REM Sleep Index  Respiratory Arousals - - - -  PLM Arousals 1 - 1 0.1  Isolated Limb Movement Arousals 2 - 2 0.3  Snore Arousals - - - -  Spontaneous Arousals 25 11 36 5.3  Total 28 11 39 5.7   Limb Movement Summary   Count Index  Isolated Limb Movements 6 0.9  Periodic Limb Movements (PLMs) 4 0.6  Total Limb Movements 10 1.5    Respiratory Summary   By Sleep Stage By Body Position Total   NREM REM Supine Non-Supine   Time (min) 323.0 84.0 - 407.0 407.0         Obstructive Apnea - - - - -  Mixed Apnea - - - - -  Central Apnea - - - - -  Total Apneas - - - - -  Total Apnea Index - - - - -         Hypopneas (>=3% Desat and/or Ar.) 10 11 - 21 21  AHI (>=3% Desat and/or Ar.) 1.9 7.9 - 3.1 3.1         Hypopneas (>=4% Desat) 3 4 - 7 7  AHI (>=4% Desat) 0.6 2.9 - 1.0 1.0          RERAs - - - - -  RERA Index - - - - -         RDI 1.9 7.9 - 3.1 3.1    Respiratory Event Type Index  Central Apneas -  Obstructive Apneas -  Mixed Apneas -  Central Hypopneas -  Obstructive Hypopneas 3.1  Central Apnea + Hypopnea (CAHI) -  Obstructive Apnea + Hypopnea (OAHI) 3.1   Respiratory Event Durations   Apnea Hypopnea   NREM REM NREM REM   Average (seconds) - - 14.6 18.9  Maximum (seconds) - - 18.6 25.8    Oxygen  Saturation Summary   Wake NREM REM TST TIB  Average SpO2 (%) 96.3% 95.7% 95.8% 95.7% 95.7%  Minimum SpO2 (%) 95.0% 92.0% 93.0% 92.0% 92.0%  Maximum SpO2 (%) 99.0% 100.0% 99.0% 100.0% 100.0%   Oxygen  Saturation Distribution  Range (%) Time in range (min) Time in range (%)  90.0 - 100.0 410.1 100.0%  80.0 - 90.0 - -  70.0 - 80.0 - -  60.0 - 70.0 - -  50.0 - 60.0 - -  0.0 - 50.0 - -  Time Spent <=88% SpO2  Range (%)  Time in range (min) Time in range (%)  0.0 - 88.0 - -      Count Index  Desaturations 25 3.7    Cardiac Summary   Wake NREM REM Sleep Total  Average Pulse Rate (BPM) 82.8 79.4 80.4 79.6 79.7  Minimum Pulse Rate (BPM) 82.0 75.0 76.0 75.0 75.0  Maximum Pulse Rate (BPM) 84.0 83.0 86.0 86.0 86.0   Pulse Rate Distribution:  Range (bpm) Time in range (min) Time in range (%)  0.0 - 40.0 - -  40.0 - 60.0 - -  60.0 - 80.0 297.4 71.8%  80.0 - 100.0 116.9 28.2%  100.0 - 120.0 - -  120.0 - 140.0 - -  140.0 - 200.0 - -   Supplemental O2 Summary  O2 Level Time (min) Wake (min) NREM (min) REM (min) Supine TST (min) Sleep Eff% OA# CA# MA# Hyp# (>=3%) AHI (>=3%) Hyp# (>=4%) AHI (>=%4) RERA RDI SpO2 <=88% (min) Min SpO2 Mean SpO2 Ar. Index  O2: 1.00 414.5 7.5 323.0 84.0  98.2%                Hypnograms                      Technologist Comments  The 59 y/o female was seen in the [facility] for a Split night study. The associated diagnosis is sleep apnea.  The study was performed in room 6.   The patient arrived on supplemental O2; 1.5 LPM continuous. She remained on 1.5 LPM through the entire study.   The split-night process was explained and she was fitted for a mask prior to study start. Mask: ResMed AirFit F30, Small full face hybrid.   The patient self administered own meds at 2150. She fell asleep quickly and was noted to sleep on her right side the entire study. The  patient states she sleeps this way at home and keeps a pillow between her knees.  Apneic and hypopneic events were rare and split-night criteria was not met. The study remained a diagnostic. Snore was noted to be mild to moderate and intermittent.   Supplemental O2: 1.5 LPM continuous through entire stay in the [facility].  There were no restroom visits during the recording. ECG appeared to be sinus rhythm via 2 lead monitoring PLMs/PLMAs were occasional 2 REM periods were noted No parasomnias were observed

## 2024-10-02 NOTE — Procedures (Signed)
" °  Indications for Polysomnography The patient is a 59 year-old Female who is 5' and weighs 234.0 lbs. Her BMI equals 45.9.  A full night polysomnogram was performed to evaluate for -.  MedicationQuetiapineAmitriptyline RanolazineTopiramate Polysomnogram Data A full night polysomnogram recorded the standard physiologic parameters including EEG, EOG, EMG, EKG, nasal and oral airflow.  Respiratory parameters of chest and abdominal movements were recorded with Respiratory Inductance Plethysmography belts.   Oxygen  saturation was recorded by pulse oximetry.  Sleep Architecture The total recording time of the polysomnogram was 414.2 minutes.  The total sleep time was 407.0 minutes.  The patient spent 0.5% of total sleep time in Stage N1, 70.3% in Stage N2, 8.6% in Stages N3, and 20.6% in REM.  Sleep latency was 7.5 minutes.   REM latency was 156.5 minutes.  Sleep Efficiency was 98.3%.  Wake after Sleep Onset time was - minutes.  Respiratory Events The polysomnogram revealed a presence of - obstructive, - central, and - mixed apneas resulting in an Apnea index of - events per hour.  There were 21 hypopneas (GreaterEqual to3% desaturation and/or arousal) resulting in an Apnea\Hypopnea Index (AHI  GreaterEqual to3% desaturation and/or arousal) of 3.1 events per hour.  There were 7 hypopneas (GreaterEqual to4% desaturation) resulting in an Apnea\Hypopnea Index (AHI GreaterEqual to4% desaturation) of 1.0 events per hour.  There were - Respiratory  Effort Related Arousals resulting in a RERA index of - events per hour. The Respiratory Disturbance Index is 3.1 events per hour.  The snore index was 3.8 events per hour.  Mean oxygen  saturation was 95.7%.  The lowest oxygen  saturation during sleep was 92.0%.  Time spent LessEqual to88% oxygen  saturation was  minutes ().  Limb Activity There were 10 total limb movements recorded, of this total, 4 were classified as PLMs.  PLM index was 0.6 per hour and PLM  associated with Arousals index was 0.1 per hour.  Cardiac Summary The average pulse rate was 79.7 bpm.  The minimum pulse rate was 75.0 bpm while the maximum pulse rate was 86.0 bpm.  Cardiac rhythm was normal/abnormal.   Comments: Patient had a split-night study ordered, completed as a diagnostic study. Study was performed on patient's usual oxygen  supplementation at 1.5 L  Diagnosis: Negative study for significant sleep disordered breathing with AHI of 3.1. No significant oxygen  desaturations on 1.5 L of oxygen  Sleep efficiency was excellent with fair sleep architecture No significant periodic limb movement Cardiac rhythm was sinus  Recommendations: Clinical follow-up of symptoms Current study not consistent with presence of significant sleep disordered breathing. Patient should continue 1.5 L oxygen  supplementation Avoid alcohol, sedatives and other CNS depressants that may worsen sleep apnea and disrupt normal sleep architecture. Sleep hygiene should be reviewed to assess factors that may improve sleep quality. Weight management and regular exercise should be initiated or continued  Follow-up as previously scheduled  This study was personally reviewed and electronically signed by: Jennet Epley, MD Accredited Board Certified in Sleep Medicine Date/Time:  10/02/24 "

## 2024-10-03 ENCOUNTER — Encounter: Payer: Self-pay | Admitting: Physician Assistant

## 2024-10-03 ENCOUNTER — Other Ambulatory Visit: Payer: Self-pay | Admitting: Physician Assistant

## 2024-10-03 MED ORDER — BENZONATATE 100 MG PO CAPS: 100.0000 mg | ORAL_CAPSULE | Freq: Two times a day (BID) | ORAL | 1 refills | Status: AC | PRN

## 2024-10-04 ENCOUNTER — Ambulatory Visit: Admitting: Primary Care

## 2024-10-04 ENCOUNTER — Encounter

## 2024-10-05 NOTE — Telephone Encounter (Signed)
 Patient aware.NFN

## 2024-10-06 ENCOUNTER — Encounter: Admitting: Pulmonary Disease

## 2024-10-18 ENCOUNTER — Ambulatory Visit

## 2024-10-19 ENCOUNTER — Ambulatory Visit: Admitting: Physician Assistant

## 2024-10-19 ENCOUNTER — Encounter

## 2024-10-20 ENCOUNTER — Encounter

## 2024-10-21 ENCOUNTER — Ambulatory Visit: Admitting: Physician Assistant

## 2024-10-22 ENCOUNTER — Ambulatory Visit

## 2024-11-01 ENCOUNTER — Encounter

## 2024-11-08 ENCOUNTER — Encounter: Admitting: Pulmonary Disease

## 2024-11-22 ENCOUNTER — Ambulatory Visit

## 2024-12-23 ENCOUNTER — Ambulatory Visit

## 2025-01-23 ENCOUNTER — Ambulatory Visit

## 2025-02-23 ENCOUNTER — Ambulatory Visit

## 2025-03-26 ENCOUNTER — Ambulatory Visit

## 2025-04-26 ENCOUNTER — Ambulatory Visit

## 2025-05-27 ENCOUNTER — Ambulatory Visit

## 2025-06-27 ENCOUNTER — Ambulatory Visit

## 2025-07-28 ENCOUNTER — Ambulatory Visit

## 2025-08-28 ENCOUNTER — Ambulatory Visit
